# Patient Record
Sex: Male | Born: 1955 | Race: White | Hispanic: No | Marital: Married | Smoking: Never smoker
Health system: Southern US, Community
[De-identification: ages and names within clinical notes are randomized; demographics above are authoritative.]

## PROBLEM LIST (undated history)

## (undated) DIAGNOSIS — R7309 Other abnormal glucose: Secondary | ICD-10-CM

## (undated) DIAGNOSIS — M25569 Pain in unspecified knee: Secondary | ICD-10-CM

## (undated) DIAGNOSIS — I872 Venous insufficiency (chronic) (peripheral): Secondary | ICD-10-CM

## (undated) DIAGNOSIS — M1990 Unspecified osteoarthritis, unspecified site: Secondary | ICD-10-CM

## (undated) DIAGNOSIS — E559 Vitamin D deficiency, unspecified: Secondary | ICD-10-CM

## (undated) DIAGNOSIS — I219 Acute myocardial infarction, unspecified: Secondary | ICD-10-CM

## (undated) DIAGNOSIS — I4510 Unspecified right bundle-branch block: Secondary | ICD-10-CM

## (undated) DIAGNOSIS — K219 Gastro-esophageal reflux disease without esophagitis: Secondary | ICD-10-CM

## (undated) DIAGNOSIS — E669 Obesity, unspecified: Secondary | ICD-10-CM

## (undated) DIAGNOSIS — L509 Urticaria, unspecified: Secondary | ICD-10-CM

## (undated) DIAGNOSIS — E785 Hyperlipidemia, unspecified: Secondary | ICD-10-CM

## (undated) DIAGNOSIS — I2510 Atherosclerotic heart disease of native coronary artery without angina pectoris: Secondary | ICD-10-CM

## (undated) DIAGNOSIS — I10 Essential (primary) hypertension: Secondary | ICD-10-CM

## (undated) DIAGNOSIS — J069 Acute upper respiratory infection, unspecified: Secondary | ICD-10-CM

## (undated) DIAGNOSIS — E291 Testicular hypofunction: Secondary | ICD-10-CM

## (undated) DIAGNOSIS — N471 Phimosis: Secondary | ICD-10-CM

## (undated) DIAGNOSIS — D649 Anemia, unspecified: Secondary | ICD-10-CM

## (undated) DIAGNOSIS — N390 Urinary tract infection, site not specified: Secondary | ICD-10-CM

## (undated) DIAGNOSIS — R972 Elevated prostate specific antigen [PSA]: Secondary | ICD-10-CM

## (undated) DIAGNOSIS — M79644 Pain in right finger(s): Secondary | ICD-10-CM

## (undated) HISTORY — PX: SPINE SURGERY: SHX786

## (undated) HISTORY — DX: Testicular hypofunction: E29.1

## (undated) HISTORY — DX: Unspecified right bundle-branch block: I45.10

## (undated) HISTORY — DX: Essential (primary) hypertension: I10

## (undated) HISTORY — DX: Urinary tract infection, site not specified: N39.0

## (undated) HISTORY — DX: Anemia, unspecified: D64.9

## (undated) HISTORY — DX: Pain in unspecified knee: M25.569

## (undated) HISTORY — DX: Acute myocardial infarction, unspecified: I21.9

## (undated) HISTORY — DX: Unspecified osteoarthritis, unspecified site: M19.90

## (undated) HISTORY — DX: Obesity, unspecified: E66.9

## (undated) HISTORY — DX: Gastro-esophageal reflux disease without esophagitis: K21.9

## (undated) HISTORY — DX: Pain in right finger(s): M79.644

## (undated) HISTORY — DX: Atherosclerotic heart disease of native coronary artery without angina pectoris: I25.10

## (undated) HISTORY — DX: Vitamin D deficiency, unspecified: E55.9

## (undated) HISTORY — DX: Other abnormal glucose: R73.09

## (undated) HISTORY — DX: Phimosis: N47.1

## (undated) HISTORY — DX: Acute upper respiratory infection, unspecified: J06.9

## (undated) HISTORY — DX: Venous insufficiency (chronic) (peripheral): I87.2

## (undated) HISTORY — DX: Hyperlipidemia, unspecified: E78.5

## (undated) HISTORY — PX: WISDOM TOOTH EXTRACTION: SHX21

## (undated) HISTORY — DX: Elevated prostate specific antigen (PSA): R97.20

## (undated) HISTORY — DX: Urticaria, unspecified: L50.9

---

## 1995-07-11 HISTORY — PX: LUMBAR LAMINECTOMY: SHX95

## 2000-05-26 ENCOUNTER — Encounter: Payer: Self-pay | Admitting: Emergency Medicine

## 2000-05-26 ENCOUNTER — Emergency Department (HOSPITAL_COMMUNITY): Admission: EM | Admit: 2000-05-26 | Discharge: 2000-05-26 | Payer: Self-pay | Admitting: Emergency Medicine

## 2000-05-28 ENCOUNTER — Emergency Department (HOSPITAL_COMMUNITY): Admission: EM | Admit: 2000-05-28 | Discharge: 2000-05-28 | Payer: Self-pay | Admitting: Emergency Medicine

## 2004-06-07 ENCOUNTER — Ambulatory Visit: Payer: Self-pay | Admitting: Internal Medicine

## 2004-07-26 ENCOUNTER — Ambulatory Visit: Payer: Self-pay | Admitting: Internal Medicine

## 2004-08-23 ENCOUNTER — Ambulatory Visit: Payer: Self-pay | Admitting: Internal Medicine

## 2005-02-21 ENCOUNTER — Ambulatory Visit: Payer: Self-pay | Admitting: Internal Medicine

## 2005-05-13 ENCOUNTER — Ambulatory Visit: Payer: Self-pay | Admitting: Internal Medicine

## 2005-05-16 ENCOUNTER — Ambulatory Visit: Payer: Self-pay | Admitting: Internal Medicine

## 2005-09-12 ENCOUNTER — Ambulatory Visit: Payer: Self-pay | Admitting: Internal Medicine

## 2007-02-23 ENCOUNTER — Encounter: Payer: Self-pay | Admitting: Internal Medicine

## 2007-02-23 DIAGNOSIS — I10 Essential (primary) hypertension: Secondary | ICD-10-CM | POA: Insufficient documentation

## 2007-02-23 HISTORY — DX: Essential (primary) hypertension: I10

## 2007-03-12 ENCOUNTER — Ambulatory Visit: Payer: Self-pay | Admitting: Internal Medicine

## 2007-03-12 DIAGNOSIS — K219 Gastro-esophageal reflux disease without esophagitis: Secondary | ICD-10-CM

## 2007-03-12 DIAGNOSIS — M25569 Pain in unspecified knee: Secondary | ICD-10-CM

## 2007-03-12 DIAGNOSIS — M1990 Unspecified osteoarthritis, unspecified site: Secondary | ICD-10-CM

## 2007-03-12 HISTORY — DX: Unspecified osteoarthritis, unspecified site: M19.90

## 2007-03-12 HISTORY — DX: Gastro-esophageal reflux disease without esophagitis: K21.9

## 2007-03-12 HISTORY — DX: Pain in unspecified knee: M25.569

## 2007-03-12 LAB — CONVERTED CEMR LAB
ALT: 35 units/L (ref 0–53)
AST: 19 units/L (ref 0–37)
Albumin: 3.7 g/dL (ref 3.5–5.2)
Alkaline Phosphatase: 45 units/L (ref 39–117)
BUN: 14 mg/dL (ref 6–23)
Basophils Absolute: 0.1 10*3/uL (ref 0.0–0.1)
Basophils Relative: 0.9 % (ref 0.0–1.0)
Bilirubin, Direct: 0.1 mg/dL (ref 0.0–0.3)
CO2: 31 meq/L (ref 19–32)
Calcium: 9.5 mg/dL (ref 8.4–10.5)
Chloride: 101 meq/L (ref 96–112)
Cholesterol: 205 mg/dL (ref 0–200)
Creatinine, Ser: 1 mg/dL (ref 0.4–1.5)
Direct LDL: 149.9 mg/dL
Eosinophils Absolute: 0.1 10*3/uL (ref 0.0–0.6)
Eosinophils Relative: 1.7 % (ref 0.0–5.0)
GFR calc Af Amer: 102 mL/min
GFR calc non Af Amer: 84 mL/min
Glucose, Bld: 166 mg/dL — ABNORMAL HIGH (ref 70–99)
HCT: 42.5 % (ref 39.0–52.0)
HDL: 31.5 mg/dL — ABNORMAL LOW (ref >39.0)
Hemoglobin: 15 g/dL (ref 13.0–17.0)
Lymphocytes Relative: 22.6 % (ref 12.0–46.0)
MCHC: 35.2 g/dL (ref 30.0–36.0)
MCV: 83.1 fL (ref 78.0–100.0)
Monocytes Absolute: 0.8 10*3/uL — ABNORMAL HIGH (ref 0.2–0.7)
Monocytes Relative: 10 % (ref 3.0–11.0)
Neutro Abs: 5.1 10*3/uL (ref 1.4–7.7)
Neutrophils Relative %: 64.8 % (ref 43.0–77.0)
Platelets: 281 10*3/uL (ref 150–400)
Potassium: 4.4 meq/L (ref 3.5–5.1)
RBC: 5.11 M/uL (ref 4.22–5.81)
RDW: 12.6 % (ref 11.5–14.6)
Sodium: 140 meq/L (ref 135–145)
TSH: 2.64 microintl units/mL (ref 0.35–5.50)
Total Bilirubin: 0.8 mg/dL (ref 0.3–1.2)
Total CHOL/HDL Ratio: 6.5
Total Protein: 6.8 g/dL (ref 6.0–8.3)
Triglycerides: 154 mg/dL — ABNORMAL HIGH (ref 0–149)
VLDL: 31 mg/dL (ref 0–40)
WBC: 7.9 10*3/uL (ref 4.5–10.5)

## 2007-03-14 ENCOUNTER — Encounter: Payer: Self-pay | Admitting: Internal Medicine

## 2007-03-14 DIAGNOSIS — E559 Vitamin D deficiency, unspecified: Secondary | ICD-10-CM | POA: Insufficient documentation

## 2007-03-14 HISTORY — DX: Vitamin D deficiency, unspecified: E55.9

## 2007-03-14 LAB — CONVERTED CEMR LAB: Vit D, 1,25-Dihydroxy: 16 — ABNORMAL LOW (ref 30–89)

## 2007-08-14 ENCOUNTER — Telehealth (INDEPENDENT_AMBULATORY_CARE_PROVIDER_SITE_OTHER): Payer: Self-pay | Admitting: *Deleted

## 2007-08-20 ENCOUNTER — Ambulatory Visit: Payer: Self-pay | Admitting: Internal Medicine

## 2007-08-21 LAB — CONVERTED CEMR LAB
BUN: 14 mg/dL (ref 6–23)
CO2: 32 meq/L (ref 19–32)
Calcium: 9.7 mg/dL (ref 8.4–10.5)
Chloride: 106 meq/L (ref 96–112)
Creatinine, Ser: 1.1 mg/dL (ref 0.4–1.5)
GFR calc Af Amer: 91 mL/min
GFR calc non Af Amer: 75 mL/min
Glucose, Bld: 102 mg/dL — ABNORMAL HIGH (ref 70–99)
Hgb A1c MFr Bld: 6.5 % — ABNORMAL HIGH (ref 4.6–6.0)
Potassium: 4 meq/L (ref 3.5–5.1)
Sodium: 141 meq/L (ref 135–145)

## 2007-08-24 ENCOUNTER — Ambulatory Visit: Payer: Self-pay | Admitting: Internal Medicine

## 2007-08-24 DIAGNOSIS — E669 Obesity, unspecified: Secondary | ICD-10-CM | POA: Insufficient documentation

## 2007-08-24 DIAGNOSIS — R7309 Other abnormal glucose: Secondary | ICD-10-CM | POA: Insufficient documentation

## 2007-09-15 ENCOUNTER — Emergency Department (HOSPITAL_BASED_OUTPATIENT_CLINIC_OR_DEPARTMENT_OTHER): Admission: EM | Admit: 2007-09-15 | Discharge: 2007-09-15 | Payer: Self-pay | Admitting: Emergency Medicine

## 2007-09-25 ENCOUNTER — Emergency Department (HOSPITAL_BASED_OUTPATIENT_CLINIC_OR_DEPARTMENT_OTHER): Admission: EM | Admit: 2007-09-25 | Discharge: 2007-09-25 | Payer: Self-pay | Admitting: Emergency Medicine

## 2007-12-26 ENCOUNTER — Telehealth: Payer: Self-pay | Admitting: Internal Medicine

## 2008-02-25 ENCOUNTER — Ambulatory Visit: Payer: Self-pay | Admitting: Internal Medicine

## 2008-02-26 LAB — CONVERTED CEMR LAB
BUN: 16 mg/dL (ref 6–23)
CO2: 30 meq/L (ref 19–32)
Calcium: 9.2 mg/dL (ref 8.4–10.5)
Chloride: 104 meq/L (ref 96–112)
Creatinine, Ser: 1 mg/dL (ref 0.4–1.5)
GFR calc Af Amer: 101 mL/min
GFR calc non Af Amer: 84 mL/min
Glucose, Bld: 125 mg/dL — ABNORMAL HIGH (ref 70–99)
Hgb A1c MFr Bld: 6.5 % — ABNORMAL HIGH (ref 4.6–6.0)
Potassium: 4.4 meq/L (ref 3.5–5.1)
Sodium: 139 meq/L (ref 135–145)

## 2008-02-29 ENCOUNTER — Ambulatory Visit: Payer: Self-pay | Admitting: Internal Medicine

## 2008-08-22 ENCOUNTER — Ambulatory Visit: Payer: Self-pay | Admitting: Internal Medicine

## 2008-08-24 LAB — CONVERTED CEMR LAB
ALT: 28 units/L (ref 0–53)
AST: 16 units/L (ref 0–37)
Albumin: 3.8 g/dL (ref 3.5–5.2)
Alkaline Phosphatase: 54 units/L (ref 39–117)
BUN: 20 mg/dL (ref 6–23)
Basophils Absolute: 0.1 10*3/uL (ref 0.0–0.1)
Basophils Relative: 1.3 % (ref 0.0–3.0)
Bilirubin Urine: NEGATIVE
Bilirubin, Direct: 0.1 mg/dL (ref 0.0–0.3)
CO2: 32 meq/L (ref 19–32)
Calcium: 9.2 mg/dL (ref 8.4–10.5)
Chloride: 106 meq/L (ref 96–112)
Cholesterol: 184 mg/dL (ref 0–200)
Creatinine, Ser: 1.1 mg/dL (ref 0.4–1.5)
Eosinophils Absolute: 0.2 10*3/uL (ref 0.0–0.7)
Eosinophils Relative: 2 % (ref 0.0–5.0)
GFR calc non Af Amer: 74.63 mL/min (ref >60)
Glucose, Bld: 123 mg/dL — ABNORMAL HIGH (ref 70–99)
HCT: 41.8 % (ref 39.0–52.0)
HDL: 28.1 mg/dL — ABNORMAL LOW (ref >39.00)
Hemoglobin, Urine: NEGATIVE
Hemoglobin: 14.5 g/dL (ref 13.0–17.0)
Hgb A1c MFr Bld: 7.3 % — ABNORMAL HIGH (ref 4.6–6.5)
Ketones, ur: NEGATIVE mg/dL
LDL Cholesterol: 138 mg/dL — ABNORMAL HIGH (ref 0–99)
Leukocytes, UA: NEGATIVE
Lymphocytes Relative: 26.1 % (ref 12.0–46.0)
Lymphs Abs: 2.1 10*3/uL (ref 0.7–4.0)
MCHC: 34.6 g/dL (ref 30.0–36.0)
MCV: 83.6 fL (ref 78.0–100.0)
Monocytes Absolute: 0.8 10*3/uL (ref 0.1–1.0)
Monocytes Relative: 10.3 % (ref 3.0–12.0)
Neutro Abs: 4.9 10*3/uL (ref 1.4–7.7)
Neutrophils Relative %: 60.3 % (ref 43.0–77.0)
Nitrite: NEGATIVE
PSA: 1.01 ng/mL (ref 0.10–4.00)
Platelets: 257 10*3/uL (ref 150.0–400.0)
Potassium: 4.4 meq/L (ref 3.5–5.1)
RBC: 5 M/uL (ref 4.22–5.81)
RDW: 12.5 % (ref 11.5–14.6)
Sodium: 143 meq/L (ref 135–145)
Specific Gravity, Urine: 1.025 (ref 1.000–1.030)
TSH: 1.88 microintl units/mL (ref 0.35–5.50)
Total Bilirubin: 0.8 mg/dL (ref 0.3–1.2)
Total CHOL/HDL Ratio: 7
Total Protein, Urine: 100 mg/dL
Total Protein: 6.7 g/dL (ref 6.0–8.3)
Triglycerides: 90 mg/dL (ref 0.0–149.0)
Urine Glucose: NEGATIVE mg/dL
Urobilinogen, UA: 1 (ref 0.0–1.0)
VLDL: 18 mg/dL (ref 0.0–40.0)
WBC: 8.1 10*3/uL (ref 4.5–10.5)
pH: 6.5 (ref 5.0–8.0)

## 2008-08-29 ENCOUNTER — Ambulatory Visit: Payer: Self-pay | Admitting: Internal Medicine

## 2008-11-11 ENCOUNTER — Encounter: Payer: Self-pay | Admitting: Internal Medicine

## 2008-11-11 ENCOUNTER — Telehealth: Payer: Self-pay | Admitting: Internal Medicine

## 2009-01-09 ENCOUNTER — Ambulatory Visit: Payer: Self-pay | Admitting: Internal Medicine

## 2009-01-09 LAB — CONVERTED CEMR LAB
BUN: 19 mg/dL (ref 6–23)
CO2: 31 meq/L (ref 19–32)
Calcium: 9.2 mg/dL (ref 8.4–10.5)
Chloride: 107 meq/L (ref 96–112)
Creatinine, Ser: 1.1 mg/dL (ref 0.4–1.5)
GFR calc non Af Amer: 74.52 mL/min (ref >60)
Glucose, Bld: 146 mg/dL — ABNORMAL HIGH (ref 70–99)
Hgb A1c MFr Bld: 6.9 % — ABNORMAL HIGH (ref 4.6–6.5)
Potassium: 4.6 meq/L (ref 3.5–5.1)
Sodium: 141 meq/L (ref 135–145)

## 2009-01-30 ENCOUNTER — Ambulatory Visit: Payer: Self-pay | Admitting: Internal Medicine

## 2009-02-27 ENCOUNTER — Telehealth: Payer: Self-pay | Admitting: Internal Medicine

## 2009-06-01 ENCOUNTER — Ambulatory Visit: Payer: Self-pay | Admitting: Internal Medicine

## 2009-06-01 LAB — CONVERTED CEMR LAB
BUN: 15 mg/dL (ref 6–23)
CO2: 31 meq/L (ref 19–32)
Calcium: 9.5 mg/dL (ref 8.4–10.5)
Chloride: 107 meq/L (ref 96–112)
Creatinine, Ser: 1.2 mg/dL (ref 0.4–1.5)
GFR calc non Af Amer: 67.3 mL/min (ref >60)
Glucose, Bld: 140 mg/dL — ABNORMAL HIGH (ref 70–99)
Hgb A1c MFr Bld: 6.7 % — ABNORMAL HIGH (ref 4.6–6.5)
Potassium: 4.3 meq/L (ref 3.5–5.1)
Sodium: 142 meq/L (ref 135–145)

## 2009-06-05 ENCOUNTER — Ambulatory Visit: Payer: Self-pay | Admitting: Internal Medicine

## 2009-06-05 DIAGNOSIS — L509 Urticaria, unspecified: Secondary | ICD-10-CM

## 2009-06-05 HISTORY — DX: Urticaria, unspecified: L50.9

## 2009-09-25 ENCOUNTER — Emergency Department (HOSPITAL_COMMUNITY): Admission: EM | Admit: 2009-09-25 | Discharge: 2009-09-26 | Payer: Self-pay | Admitting: Emergency Medicine

## 2009-09-28 ENCOUNTER — Ambulatory Visit: Payer: Self-pay | Admitting: Internal Medicine

## 2009-09-28 LAB — CONVERTED CEMR LAB
BUN: 17 mg/dL (ref 6–23)
CO2: 30 meq/L (ref 19–32)
Calcium: 9.1 mg/dL (ref 8.4–10.5)
Chloride: 107 meq/L (ref 96–112)
Creatinine, Ser: 1.1 mg/dL (ref 0.4–1.5)
GFR calc non Af Amer: 77.56 mL/min (ref >60)
Glucose, Bld: 180 mg/dL — ABNORMAL HIGH (ref 70–99)
Hgb A1c MFr Bld: 6.8 % — ABNORMAL HIGH (ref 4.6–6.5)
Potassium: 4.5 meq/L (ref 3.5–5.1)
Sodium: 142 meq/L (ref 135–145)

## 2009-10-02 ENCOUNTER — Ambulatory Visit: Payer: Self-pay | Admitting: Internal Medicine

## 2010-02-01 ENCOUNTER — Ambulatory Visit: Payer: Self-pay | Admitting: Internal Medicine

## 2010-02-01 LAB — CONVERTED CEMR LAB
BUN: 14 mg/dL (ref 6–23)
CO2: 29 meq/L (ref 19–32)
Calcium: 9.3 mg/dL (ref 8.4–10.5)
Chloride: 102 meq/L (ref 96–112)
Creatinine, Ser: 1.1 mg/dL (ref 0.4–1.5)
GFR calc non Af Amer: 74.22 mL/min (ref >60)
Glucose, Bld: 148 mg/dL — ABNORMAL HIGH (ref 70–99)
Hgb A1c MFr Bld: 7 % — ABNORMAL HIGH (ref 4.6–6.5)
Potassium: 4.7 meq/L (ref 3.5–5.1)
Sodium: 138 meq/L (ref 135–145)

## 2010-02-05 ENCOUNTER — Ambulatory Visit: Payer: Self-pay | Admitting: Internal Medicine

## 2010-02-05 DIAGNOSIS — E119 Type 2 diabetes mellitus without complications: Secondary | ICD-10-CM | POA: Insufficient documentation

## 2010-05-13 NOTE — Assessment & Plan Note (Signed)
Summary: 4 MTH FU  STC   Vital Signs:  Patient profile:   55 year old male Weight:      312 pounds BMI:     43.67 Temp:     97.5 degrees F oral Pulse rate:   81 / minute BP sitting:   100 / 70  (left arm)  Vitals Entered By: Tora Perches (June 05, 2009 7:57 AM) CC: f/u   CC:  f/u.  History of Present Illness: C/o rash on  arms once; however now c/o hives after scratching The patient presents for a follow up of hypertension, diabetes   Preventive Screening-Counseling & Management  Alcohol-Tobacco     Smoking Status: never  Current Medications (verified): 1)  Omeprazole 40 Mg  Cpdr (Omeprazole) .... Once Daily in Am 2)  Lotensin Hct 20-12.5 Mg  Tabs (Benazepril-Hydrochlorothiazide) .... Take 2 By Mouth Qd 3)  Naprosyn 500 Mg Tabs (Naproxen) .Marland Kitchen.. 1 Two Times A Day Pc As Needed Pain 4)  Vitamin D3 1000 Unit  Tabs (Cholecalciferol) .Marland Kitchen.. 1 Qd 5)  Metformin Hcl 500 Mg Tabs (Metformin Hcl) .Marland Kitchen.. 1 By Mouth Bid 6)  Fish Oil 1000 Mg Caps (Omega-3 Fatty Acids) .... Two Times A Day 7)  Freestyle Test  Strp (Glucose Blood) .... 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    Physical Exam  General:  overweight-appearing.   Nose:  External nasal examination shows no deformity or inflammation. Nasal mucosa are pink and moist without lesions or exudates. Mouth:  Oral mucosa and oropharynx without lesions or exudates.  Teeth in good repair. Lungs:  Normal respiratory effort, chest expands symmetrically. Lungs are clear to auscultation, no crackles or wheezes. Heart:  Normal rate and regular rhythm. S1 and S2 normal without gallop, murmur, click, rub or other  extra sounds. Abdomen:  Bowel sounds positive,abdomen soft and non-tender without masses, organomegaly or hernias noted. Msk:  No deformity or scoliosis noted of thoracic or lumbar spine.   Neurologic:  No cranial nerve deficits noted. Station and gait are normal. Plantar reflexes are down-going bilaterally. DTRs are symmetrical throughout. Sensory, motor and coordinative functions appear intact. Skin:  no rash   Impression & Recommendations:  Problem # 1:  URTICARIA (ICD-708.9) Assessment New Hold Benazepr/HCT ( likely HCTZ is causing the rash)  Problem # 2:  OBESITY (ICD-278.00) Assessment: Improved  Problem # 3:  GERD (ICD-530.81) Assessment: Unchanged  His updated medication list for this problem includes:    Omeprazole 40 Mg Cpdr (Omeprazole) ..... Once daily in am  Complete Medication List: 1)  Omeprazole 40 Mg Cpdr (Omeprazole) .... Once daily in am 2)  Lotensin Hct 20-12.5 Mg Tabs (Benazepril-hydrochlorothiazide) .... Take 2 by mouth qd 3)  Naprosyn 500 Mg Tabs (Naproxen) .Marland Kitchen.. 1 two times a day pc as needed pain 4)  Vitamin D3 1000 Unit Tabs (Cholecalciferol) .Marland Kitchen.. 1 qd 5)  Metformin Hcl 500 Mg Tabs (Metformin hcl) .Marland Kitchen.. 1 by mouth bid 6)  Fish Oil 1000 Mg Caps (Omega-3 fatty acids) .... Two times a day 7)  Freestyle Test Strp (Glucose blood) .... Test two times a day  Contraindications/Deferment of Procedures/Staging:    Test/Procedure: FLU VAX    Reason for deferment: patient declined  Patient Instructions: 1)  Hold Benazepr/HCT 2)  Restart Benicar 1 a day 3)  Please schedule a follow-up appointment in 3-4 months. 4)  BMP prior to visit, ICD-9: 5)  HbgA1C prior to visit, ICD-9:250.00

## 2010-05-13 NOTE — Assessment & Plan Note (Signed)
Summary: 4 mo rov /nws   Vital Signs:  Patient profile:   55 year old male Height:      71 inches Weight:      313 pounds BMI:     43.81 Temp:     98.3 degrees F oral Pulse rate:   76 / minute Pulse rhythm:   regular Resp:     16 per minute BP sitting:   138 / 80  (left arm) Cuff size:   large  Vitals Entered By: Lanier Prude, CMA(AAMA) (February 05, 2010 7:51 AM) CC: 4 mo f/u Is Patient Diabetic? No   CC:  4 mo f/u.  History of Present Illness: The patient presents for a follow up of hypertension, diabetes, rash   Current Medications (verified): 1)  Omeprazole 40 Mg  Cpdr (Omeprazole) .... Once Daily in Am 2)  Naprosyn 500 Mg Tabs (Naproxen) .Marland Kitchen.. 1 Two Times A Day Pc As Needed Pain 3)  Vitamin D3 1000 Unit  Tabs (Cholecalciferol) .Marland Kitchen.. 1 Qd 4)  Fish Oil 1000 Mg Caps (Omega-3 Fatty Acids) .... Two Times A Day 5)  Freestyle Test  Strp (Glucose Blood) .... Test Two Times A Day 6)  Hydrocodone-Acetaminophen 5-325 Mg Tabs (Hydrocodone-Acetaminophen) .... As Directed 7)  Benazepril Hcl 40 Mg Tabs (Benazepril Hcl) .Marland Kitchen.. 1 By Mouth Qd 8)  Metformin Hcl 500 Mg Tabs (Metformin Hcl) .Marland Kitchen.. 1 By Mouth Two Times A Day For Diabetes  Allergies (verified): No Known Drug Allergies  Past History:  Past Medical History: Hypertension GERD Osteoarthritis Obesity Elev glu 2009 Diabetes mellitus, type II  2011  Review of Systems  The patient denies weight loss, weight gain, chest pain, dyspnea on exertion, and abdominal pain.    Contraindications/Deferment of Procedures/Staging:    Test/Procedure: FLU VAX    Reason for deferment: patient declined   Physical Exam  General:  overweight-appearing.   Nose:  External nasal examination shows no deformity or inflammation. Nasal mucosa are pink and moist without lesions or exudates. Mouth:  Oral mucosa and oropharynx without lesions or exudates.  Teeth in good repair. Lungs:  Normal respiratory effort, chest expands symmetrically.  Lungs are clear to auscultation, no crackles or wheezes. Heart:  Normal rate and regular rhythm. S1 and S2 normal without gallop, murmur, click, rub or other extra sounds. Abdomen:  Bowel sounds positive,abdomen soft and non-tender without masses, organomegaly or hernias noted. Msk:  No deformity or scoliosis noted of thoracic or lumbar spine.   Neurologic:  No cranial nerve deficits noted. Station and gait are normal. Plantar reflexes are down-going bilaterally. DTRs are symmetrical throughout. Sensory, motor and coordinative functions appear intact. Skin:  no rash hyperpigm R anter shin patch Psych:  Cognition and judgment appear intact. Alert and cooperative with normal attention span and concentration. No apparent delusions, illusions, hallucinations   Impression & Recommendations:  Problem # 1:  URTICARIA (ICD-708.9) resolved Assessment Improved  Problem # 2:  HYPERTENSION (ICD-401.9) Assessment: Unchanged  His updated medication list for this problem includes:    Benazepril Hcl 40 Mg Tabs (Benazepril hcl) .Marland Kitchen... 1 by mouth qd  Complete Medication List: 1)  Omeprazole 40 Mg Cpdr (Omeprazole) .... Once daily in am 2)  Naprosyn 500 Mg Tabs (Naproxen) .Marland Kitchen.. 1 two times a day pc as needed pain 3)  Vitamin D3 1000 Unit Tabs (Cholecalciferol) .Marland Kitchen.. 1 qd 4)  Fish Oil 1000 Mg Caps (Omega-3 fatty acids) .... Two times a day 5)  Freestyle Test Strp (Glucose blood) .... Test two times  a day 6)  Hydrocodone-acetaminophen 5-325 Mg Tabs (Hydrocodone-acetaminophen) .... As directed 7)  Benazepril Hcl 40 Mg Tabs (Benazepril hcl) .Marland Kitchen.. 1 by mouth qd 8)  Metformin Hcl 500 Mg Tabs (Metformin hcl) .Marland Kitchen.. 1 by mouth three times a day for diabetes  Patient Instructions: 1)  Please schedule a follow-up appointment in 4 months. 2)  BMP prior to visit, ICD-9: 3)  HbgA1C prior to visit, ICD-9: 250.00 Prescriptions: METFORMIN HCL 500 MG TABS (METFORMIN HCL) 1 by mouth three times a day for diabetes  #270 x  3   Entered and Authorized by:   Tresa Garter MD   Signed by:   Tresa Garter MD on 02/05/2010   Method used:   Print then Give to Patient   RxID:   [id number] BENAZEPRIL HCL 40 MG TABS (BENAZEPRIL HCL) 1 by mouth qd  #90 x 3   Entered and Authorized by:   Tresa Garter MD   Signed by:   Tresa Garter MD on 02/05/2010   Method used:   Print then Give to Patient   RxID:   [id number] NAPROSYN 500 MG TABS (NAPROXEN) 1 two times a day pc as needed pain  #180 x 1   Entered and Authorized by:   Tresa Garter MD   Signed by:   Tresa Garter MD on 02/05/2010   Method used:   Print then Give to Patient   RxID:   [id number] OMEPRAZOLE 40 MG  CPDR (OMEPRAZOLE) once daily in am  #90 x 3   Entered and Authorized by:   Tresa Garter MD   Signed by:   Tresa Garter MD on 02/05/2010   Method used:   Print then Give to Patient   RxID:   [id number] METFORMIN HCL 500 MG TABS (METFORMIN HCL) 1 by mouth three times a day for diabetes  #90 x 11   Entered and Authorized by:   Tresa Garter MD   Signed by:   Tresa Garter MD on 02/05/2010   Method used:   Print then Give to Patient   RxID:   [id number]    Orders Added: 1)  Est. Patient Level IV [85277]   Not Administered:    Influenza Vaccine not given due to: declined

## 2010-05-13 NOTE — Assessment & Plan Note (Signed)
Summary: 3-4 MO ROV /NWS  #   Vital Signs:  Patient profile:   55 year old male Height:      71 inches (180.34 cm) Weight:      313.75 pounds (142.61 kg) BMI:     43.92 O2 Sat:      96 % on Room air Temp:     97.7 degrees F (36.50 degrees C) oral Pulse rate:   80 / minute BP sitting:   122 / 78  (left arm) Cuff size:   large  Vitals Entered By: Lucious Groves (October 02, 2009 7:52 AM)  O2 Flow:  Room air CC: 3-4 mo rtn ov./kb Comments Patient is taking eye drops due to recent eye injury./kb   CC:  3-4 mo rtn ov./kb.  History of Present Illness: The patient presents for a follow up of GERD, elev. glu, hyperlipidemia   Current Medications (verified): 1)  Omeprazole 40 Mg  Cpdr (Omeprazole) .... Once Daily in Am 2)  Lotensin Hct 20-12.5 Mg  Tabs (Benazepril-Hydrochlorothiazide) .... Take 2 By Mouth Qd 3)  Naprosyn 500 Mg Tabs (Naproxen) .Marland Kitchen.. 1 Two Times A Day Pc As Needed Pain 4)  Vitamin D3 1000 Unit  Tabs (Cholecalciferol) .Marland Kitchen.. 1 Qd 5)  Metformin Hcl 500 Mg Tabs (Metformin Hcl) .Marland Kitchen.. 1 By Mouth Bid 6)  Fish Oil 1000 Mg Caps (Omega-3 Fatty Acids) .... Two Times A Day 7)  Freestyle Test  Strp (Glucose Blood) .... Test Two Times A Day 8)  Hydrocodone-Acetaminophen 5-325 Mg Tabs (Hydrocodone-Acetaminophen) .... 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 Family History Hypertension S DM  Social History: Reviewed history from 01/30/2009 and no changes required. 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    Physical Exam  General:   overweight-appearing.   Nose:  External nasal examination shows no deformity or inflammation. Nasal mucosa are pink and moist without lesions or exudates. Mouth:  Oral mucosa and oropharynx without lesions or exudates.  Teeth in good repair. Neck:  No deformities, masses, or tenderness noted. Lungs:  Normal respiratory effort, chest expands symmetrically. Lungs are clear to auscultation, no crackles or wheezes. Heart:  Normal rate and regular rhythm. S1 and S2 normal without gallop, murmur, click, rub or other extra sounds. Abdomen:  Bowel sounds positive,abdomen soft and non-tender without masses, organomegaly or hernias noted. Msk:  No deformity or scoliosis noted of thoracic or lumbar spine.   Neurologic:  No cranial nerve deficits noted. Station and gait are normal. Plantar reflexes are down-going bilaterally. DTRs are symmetrical throughout. Sensory, motor and coordinative functions appear intact. Skin:  no rash Psych:  Cognition and judgment appear intact. Alert and cooperative with normal attention span and concentration. No apparent delusions, illusions, hallucinations   Impression & Recommendations:  Problem # 1:  ABNORMAL GLUCOSE NEC (ICD-790.29) Assessment Deteriorated  The following medications were removed from the medication list:    Metformin Hcl 500 Mg Tabs (Metformin hcl) .Marland Kitchen... 1 by mouth bid    Metformin Hcl 1000 Mg Tabs (Metformin hcl) .Marland Kitchen... 1 by mouth two times a day for diabetes His updated medication list for this problem includes:    Metformin  Hcl 500 Mg Tabs (Metformin hcl) .Marland Kitchen... 1 by mouth two times a day for diabetes  Problem # 2:  HYPERTENSION (ICD-401.9) Assessment: Unchanged  The following medications were removed from the medication list:    Lotensin Hct 20-12.5 Mg Tabs (Benazepril-hydrochlorothiazide) .Marland Kitchen... Take 2 by mouth qd His updated medication list for this problem includes:    Benazepril Hcl 40 Mg Tabs (Benazepril hcl) .Marland Kitchen... 1 by mouth qd  BP  today: 122/78 Prior BP: 100/70 (06/05/2009)  Labs Reviewed: K+: 4.5 (09/28/2009) Creat: : 1.1 (09/28/2009)   Chol: 184 (08/22/2008)   HDL: 28.10 (08/22/2008)   LDL: 138 (08/22/2008)   TG: 90.0 (08/22/2008)  Problem # 3:  OBESITY (ICD-278.00) Assessment: Unchanged  Complete Medication List: 1)  Omeprazole 40 Mg Cpdr (Omeprazole) .... Once daily in am 2)  Naprosyn 500 Mg Tabs (Naproxen) .Marland Kitchen.. 1 two times a day pc as needed pain 3)  Vitamin D3 1000 Unit Tabs (Cholecalciferol) .Marland Kitchen.. 1 qd 4)  Fish Oil 1000 Mg Caps (Omega-3 fatty acids) .... Two times a day 5)  Freestyle Test Strp (Glucose blood) .... Test two times a day 6)  Hydrocodone-acetaminophen 5-325 Mg Tabs (Hydrocodone-acetaminophen) .... As directed 7)  Benazepril Hcl 40 Mg Tabs (Benazepril hcl) .Marland Kitchen.. 1 by mouth qd 8)  Metformin Hcl 500 Mg Tabs (Metformin hcl) .Marland Kitchen.. 1 by mouth two times a day for diabetes  Patient Instructions: 1)  Please schedule a follow-up appointment in 4 months. 2)  BMP prior to visit, ICD-9: 3)  HbgA1C prior to visit, ICD-9: 995.20 Prescriptions: METFORMIN HCL 500 MG TABS (METFORMIN HCL) 1 by mouth two times a day for diabetes  #180 x 3   Entered and Authorized by:   Tresa Garter MD   Signed by:   Tresa Garter MD on 10/02/2009   Method used:   Print then Give to Patient   RxID:   [id number] NAPROSYN 500 MG TABS (NAPROXEN) 1 two times a day pc as needed pain  #180 x 1   Entered and Authorized by:   Tresa Garter MD   Signed by:   Tresa Garter MD on 10/02/2009   Method used:   Print then Give to Patient   RxID:   [id number] OMEPRAZOLE 40 MG  CPDR (OMEPRAZOLE) once daily in am  #90 x 3   Entered and Authorized by:   Tresa Garter MD   Signed by:   Tresa Garter MD on 10/02/2009   Method used:   Print then Give to Patient   RxID:   [id number] METFORMIN HCL 1000 MG TABS (METFORMIN HCL) 1 by mouth two times a day for diabetes  #180 x 3   Entered and  Authorized by:   Tresa Garter MD   Signed by:   Tresa Garter MD on 10/02/2009   Method used:   Print then Give to Patient   RxID:   [id number] BENAZEPRIL HCL 40 MG TABS (BENAZEPRIL HCL) 1 by mouth qd  #90 x 3   Entered and Authorized by:   Tresa Garter MD   Signed by:   Tresa Garter MD on 10/02/2009   Method used:   Print then Give to Patient   RxID:   [id number]

## 2010-06-07 ENCOUNTER — Other Ambulatory Visit: Payer: Self-pay

## 2010-06-07 ENCOUNTER — Encounter (INDEPENDENT_AMBULATORY_CARE_PROVIDER_SITE_OTHER): Payer: Self-pay | Admitting: *Deleted

## 2010-06-07 ENCOUNTER — Other Ambulatory Visit: Payer: Self-pay | Admitting: Internal Medicine

## 2010-06-07 DIAGNOSIS — E119 Type 2 diabetes mellitus without complications: Secondary | ICD-10-CM

## 2010-06-07 LAB — BASIC METABOLIC PANEL
Chloride: 105 mEq/L (ref 96–112)
Creatinine, Ser: 1 mg/dL (ref 0.4–1.5)
GFR: 85.7 mL/min (ref >60.00)
Potassium: 4.7 mEq/L (ref 3.5–5.1)

## 2010-06-07 LAB — HEMOGLOBIN A1C: Hgb A1c MFr Bld: 7.6 % — ABNORMAL HIGH (ref 4.6–6.5)

## 2010-06-11 ENCOUNTER — Ambulatory Visit (INDEPENDENT_AMBULATORY_CARE_PROVIDER_SITE_OTHER): Payer: BC Managed Care – PPO | Admitting: Internal Medicine

## 2010-06-11 ENCOUNTER — Encounter: Payer: Self-pay | Admitting: Internal Medicine

## 2010-06-11 DIAGNOSIS — J069 Acute upper respiratory infection, unspecified: Secondary | ICD-10-CM

## 2010-06-11 DIAGNOSIS — E119 Type 2 diabetes mellitus without complications: Secondary | ICD-10-CM

## 2010-06-11 DIAGNOSIS — I10 Essential (primary) hypertension: Secondary | ICD-10-CM

## 2010-06-11 DIAGNOSIS — E669 Obesity, unspecified: Secondary | ICD-10-CM

## 2010-06-11 HISTORY — DX: Acute upper respiratory infection, unspecified: J06.9

## 2010-06-17 NOTE — Assessment & Plan Note (Signed)
Summary: 4 MO FU/NWS#   Vital Signs:  Patient profile:   55 year old male Height:      71 inches Weight:      320 pounds BMI:     44.79 Temp:     98.8 degrees F oral Pulse rate:   80 / minute Pulse rhythm:   regular Resp:     16 per minute BP sitting:   138 / 80  (left arm) Cuff size:   large  Vitals Entered By: Lanier Prude, CMA(AAMA) (June 11, 2010 8:06 AM) CC: 4 mo f/u  Is Patient Diabetic? Yes Comments pt is not taking Hydroco/APAP   CC:  4 mo f/u .  History of Present Illness: The patient presents for a follow up of hypertension, diabetes, hyperlipidemia  The patient presents with complaints of sore throat, fever, cough, sinus congestion and drainge of 10-14 days duration. Not better with OTC meds. Chest hurts with coughing. Can't sleep due to cough. Muscle aches are present.  The mucus is colored green.   Current Medications (verified): 1)  Omeprazole 40 Mg  Cpdr (Omeprazole) .... Once Daily in Am 2)  Naprosyn 500 Mg Tabs (Naproxen) .Marland Kitchen.. 1 Two Times A Day Pc As Needed Pain 3)  Vitamin D3 1000 Unit  Tabs (Cholecalciferol) .Marland Kitchen.. 1 Qd 4)  Fish Oil 1000 Mg Caps (Omega-3 Fatty Acids) .... Two Times A Day 5)  Freestyle Test  Strp (Glucose Blood) .... Test Two Times A Day 6)  Hydrocodone-Acetaminophen 5-325 Mg Tabs (Hydrocodone-Acetaminophen) .... As Directed 7)  Benazepril Hcl 40 Mg Tabs (Benazepril Hcl) .Marland Kitchen.. 1 By Mouth Qd 8)  Metformin Hcl 500 Mg Tabs (Metformin Hcl) .Marland Kitchen.. 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   Mouth:  Erythematous throat and intranasal mucosa c/w URI   Lungs:  Normal respiratory effort, chest  expands symmetrically. Lungs are clear to auscultation, no crackles or wheezes. Heart:  Normal rate and regular rhythm. S1 and S2 normal without gallop, murmur, click, rub or other extra sounds. Abdomen:  Bowel sounds positive,abdomen soft and non-tender without masses, organomegaly or hernias noted. Msk:  No deformity or scoliosis noted of thoracic or lumbar spine.   Extremities:  No clubbing, cyanosis, edema, or deformity noted with normal full range of motion of all joints.   Neurologic:  No cranial nerve deficits noted. Station and gait are normal. Plantar reflexes are down-going bilaterally. DTRs are symmetrical throughout. Sensory, motor and coordinative functions appear intact. Skin:  no rash hyperpigm R anter shin patch Psych:  Cognition and judgment appear intact. Alert and cooperative with normal attention span and concentration. No apparent delusions, illusions, hallucinations   Impression & Recommendations:  Problem # 1:  DIABETES MELLITUS, TYPE II (ICD-250.00) Assessment Deteriorated  The following medications were removed from the medication list:    Metformin Hcl 500 Mg Tabs (Metformin hcl) .Marland Kitchen... 1 by mouth three times a day for diabetes His updated medication list for this problem includes:    Benazepril Hcl 40 Mg Tabs (Benazepril hcl) .Marland Kitchen... 1 by mouth qd    Kombiglyze Xr 2.08-998 Mg Xr24h-tab (Saxagliptin-metformin) .Marland Kitchen... 2 by mouth two times a day for diabetes  Labs Reviewed: Creat: 1.0 (06/07/2010)  Reviewed HgBA1c results: 7.6 (06/07/2010)  7.0 (02/01/2010)  Problem # 2:  UPPER RESPIRATORY INFECTION, ACUTE (ICD-465.9) Assessment: New Augmentin Sudafed His updated medication list for this problem includes:    Naprosyn 500 Mg Tabs (Naproxen) .Marland Kitchen... 1 two times a day pc as needed pain    Promethazine-codeine 6.25-10 Mg/52ml Syrp (Promethazine-codeine) .Marland Kitchen... 5-10 ml by mouth q id as needed cough  Problem # 3:  OBESITY (ICD-278.00) Assessment:  Deteriorated  Problem # 4:  HYPERTENSION (ICD-401.9) Assessment: Unchanged  His updated medication list for this problem includes:    Benazepril Hcl 40 Mg Tabs (Benazepril hcl) .Marland Kitchen... 1 by mouth qd  Complete Medication List: 1)  Omeprazole 40 Mg Cpdr (Omeprazole) .... Once daily in am 2)  Naprosyn 500 Mg Tabs (Naproxen) .Marland Kitchen.. 1 two times a day pc as needed pain 3)  Vitamin D3 1000 Unit Tabs (Cholecalciferol) .Marland Kitchen.. 1 qd 4)  Fish Oil 1000 Mg Caps (Omega-3 fatty acids) .... Two times a day 5)  Freestyle Test Strp (Glucose blood) .... Test two times a day 6)  Benazepril Hcl 40 Mg Tabs (Benazepril hcl) .Marland Kitchen.. 1 by mouth qd 7)  Kombiglyze Xr 2.08-998 Mg Xr24h-tab (Saxagliptin-metformin) .... 2 by mouth two times a day for diabetes 8)  Augmentin 875-125 Mg Tabs (Amoxicillin-pot clavulanate) .Marland Kitchen.. 1 by mouth bid 9)  Sudafed 12 Hour 120 Mg Xr12h-tab (Pseudoephedrine hcl) .Marland Kitchen.. 1 by mouth two times a day as needed allergies 10)  Promethazine-codeine 6.25-10 Mg/40ml Syrp (Promethazine-codeine) .... 5-10 ml by mouth q id as needed cough  Patient Instructions: 1)  Please schedule a follow-up appointment in 4 months. 2)  BMP prior to visit, ICD-9: 3)  Hepatic Panel prior to visit, ICD-9: 4)  Lipid Panel prior to visit, ICD-9:250.02 5)  HbgA1C prior to visit, ICD-9: 6)  Use over-the-counter medicines for "cold": Tylenol  650mg  or Advil 400mg  every 6 hours  for fever; Delsym or Robutussin for cough. Mucinex  for congestion. Ricola or Halls for sore throat. Office visit if not better or if worse.  Prescriptions: PROMETHAZINE-CODEINE 6.25-10 MG/5ML SYRP (PROMETHAZINE-CODEINE) 5-10 ml by mouth q id as needed cough  #300 ml x 0   Entered and Authorized by:   Tresa Garter MD   Signed by:   Tresa Garter MD on 06/11/2010   Method used:   Print then Give to Patient   RxID:   [id number] SUDAFED 12 HOUR 120 MG XR12H-TAB (PSEUDOEPHEDRINE HCL) 1 by mouth two times a day as needed allergies  #60 x  1   Entered and Authorized by:   Tresa Garter MD   Signed by:   Tresa Garter MD on 06/11/2010   Method used:   Print then Give to Patient   RxID:   [id number] AUGMENTIN 875-125 MG TABS (AMOXICILLIN-POT CLAVULANATE) 1 by mouth bid  #20 x 0   Entered and Authorized by:   Tresa Garter MD   Signed by:   Tresa Garter MD on 06/11/2010   Method used:   Print then Give to Patient   RxID:   [id number] KOMBIGLYZE XR 2.08-998 MG XR24H-TAB (SAXAGLIPTIN-METFORMIN) 2 by mouth two times a day for diabetes  #60 x 11   Entered and Authorized by:   Tresa Garter MD   Signed by:   Tresa Garter MD on 06/11/2010   Method used:   Print then Give to Patient   RxID:   [id number]    Orders Added: 1)  New Patient Level  IV [id number]

## 2010-09-27 ENCOUNTER — Telehealth: Payer: Self-pay | Admitting: *Deleted

## 2010-09-27 NOTE — Telephone Encounter (Signed)
1 po bid is correct. Sorry! Thx 

## 2010-09-27 NOTE — Telephone Encounter (Signed)
rec fax from pharm Re: written Rx dated 06-11-10 for Kombiglyze 2.08-998  2 po bid # 60.  Should the sig be 1 po bid?

## 2010-09-28 NOTE — Telephone Encounter (Signed)
Left detailed mess informing pharmacy of below.  

## 2010-10-11 ENCOUNTER — Other Ambulatory Visit (INDEPENDENT_AMBULATORY_CARE_PROVIDER_SITE_OTHER): Payer: BC Managed Care – PPO

## 2010-10-11 ENCOUNTER — Other Ambulatory Visit: Payer: Self-pay | Admitting: Internal Medicine

## 2010-10-11 LAB — LIPID PANEL
Cholesterol: 179 mg/dL (ref 0–200)
LDL Cholesterol: 114 mg/dL — ABNORMAL HIGH (ref 0–99)
Triglycerides: 160 mg/dL — ABNORMAL HIGH (ref 0.0–149.0)
VLDL: 32 mg/dL (ref 0.0–40.0)

## 2010-10-11 LAB — BASIC METABOLIC PANEL
CO2: 28 mEq/L (ref 19–32)
Chloride: 105 mEq/L (ref 96–112)
Creatinine, Ser: 1.1 mg/dL (ref 0.4–1.5)

## 2010-10-11 LAB — HEPATIC FUNCTION PANEL: Total Bilirubin: 0.7 mg/dL (ref 0.3–1.2)

## 2010-10-15 ENCOUNTER — Encounter: Payer: Self-pay | Admitting: Internal Medicine

## 2010-10-15 ENCOUNTER — Ambulatory Visit (INDEPENDENT_AMBULATORY_CARE_PROVIDER_SITE_OTHER): Payer: BC Managed Care – PPO | Admitting: Internal Medicine

## 2010-10-15 DIAGNOSIS — R5383 Other fatigue: Secondary | ICD-10-CM

## 2010-10-15 DIAGNOSIS — E669 Obesity, unspecified: Secondary | ICD-10-CM

## 2010-10-15 DIAGNOSIS — I10 Essential (primary) hypertension: Secondary | ICD-10-CM

## 2010-10-15 DIAGNOSIS — E785 Hyperlipidemia, unspecified: Secondary | ICD-10-CM

## 2010-10-15 DIAGNOSIS — E119 Type 2 diabetes mellitus without complications: Secondary | ICD-10-CM

## 2010-10-15 DIAGNOSIS — R5381 Other malaise: Secondary | ICD-10-CM

## 2010-10-15 HISTORY — DX: Hyperlipidemia, unspecified: E78.5

## 2010-10-15 MED ORDER — SAXAGLIPTIN-METFORMIN ER 2.5-1000 MG PO TB24: 1.0000 | ORAL_TABLET | Freq: Two times a day (BID) | ORAL | Status: DC

## 2010-10-15 MED ORDER — ASPIRIN EC 81 MG PO TBEC: 81.0000 mg | DELAYED_RELEASE_TABLET | Freq: Every day | ORAL | Status: DC

## 2010-10-15 NOTE — Progress Notes (Signed)
  Subjective:    Patient ID: Andrew Carson, male    DOB: November 11, 1955, 55 y.o.   MRN: [id number]  HPI  The patient presents for a follow-up of  chronic hypertension, chronic dyslipidemia, type 2 diabetes controlled with medicines    Review of Systems  Constitutional: Negative for appetite change, fatigue and unexpected weight change.  HENT: Negative for nosebleeds, congestion, sore throat, sneezing, trouble swallowing and neck pain.   Eyes: Negative for itching and visual disturbance.  Respiratory: Negative for cough.   Cardiovascular: Negative for chest pain, palpitations and leg swelling.  Gastrointestinal: Negative for nausea, diarrhea, blood in stool and abdominal distention.  Genitourinary: Negative for frequency and hematuria.  Musculoskeletal: Negative for back pain, joint swelling and gait problem.  Skin: Negative for rash.  Neurological: Negative for dizziness, tremors, speech difficulty and weakness.  Psychiatric/Behavioral: Negative for sleep disturbance, dysphoric mood and agitation. The patient is not nervous/anxious.    Wt Readings from Last 3 Encounters:  10/15/10 319 lb (144.697 kg)  06/11/10 320 lb (145.151 kg)  02/05/10 313 lb (141.976 kg)       Objective:   Physical Exam  Constitutional: He is oriented to person, place, and time. He appears well-developed.       Obese  HENT:  Mouth/Throat: Oropharynx is clear and moist.  Eyes: Conjunctivae are normal. Pupils are equal, round, and reactive to light.  Neck: Normal range of motion. No JVD present. No thyromegaly present.  Cardiovascular: Normal rate, regular rhythm, normal heart sounds and intact distal pulses.  Exam reveals no gallop and no friction rub.   No murmur heard. Pulmonary/Chest: Effort normal and breath sounds normal. No respiratory distress. He has no wheezes. He has no rales. He exhibits no tenderness.  Abdominal: Soft. Bowel sounds are normal. He exhibits no distension and no mass. There is no  tenderness. There is no rebound and no guarding.  Musculoskeletal: Normal range of motion. He exhibits no edema and no tenderness.  Lymphadenopathy:    He has no cervical adenopathy.  Neurological: He is alert and oriented to person, place, and time. He has normal reflexes. No cranial nerve deficit. He exhibits normal muscle tone. Coordination normal.  Skin: Skin is warm and dry. No rash noted.  Psychiatric: He has a normal mood and affect. His behavior is normal. Judgment and thought content normal.         Lab Results  Component Value Date   WBC 8.1 08/22/2008   HGB 14.5 08/22/2008   HCT 41.8 08/22/2008   PLT 257.0 08/22/2008   CHOL 179 10/11/2010   TRIG 160.0* 10/11/2010   HDL 33.30* 10/11/2010   LDLDIRECT 149.9 03/12/2007   ALT 38 10/11/2010   AST 20 10/11/2010   NA 139 10/11/2010   K 4.2 10/11/2010   CL 105 10/11/2010   CREATININE 1.1 10/11/2010   BUN 20 10/11/2010   CO2 28 10/11/2010   TSH 1.88 08/22/2008   PSA 1.01 08/22/2008   HGBA1C 7.0* 10/11/2010    Assessment & Plan:

## 2010-10-15 NOTE — Assessment & Plan Note (Signed)
Start ASA

## 2010-10-15 NOTE — Assessment & Plan Note (Signed)
Cont w/diet 

## 2010-10-15 NOTE — Assessment & Plan Note (Signed)
Cont Rx 

## 2010-10-15 NOTE — Assessment & Plan Note (Signed)
Better  

## 2010-10-29 ENCOUNTER — Inpatient Hospital Stay (INDEPENDENT_AMBULATORY_CARE_PROVIDER_SITE_OTHER)
Admission: RE | Admit: 2010-10-29 | Discharge: 2010-10-29 | Disposition: A | Discharge status: Home / Self Care | Payer: BC Managed Care – PPO | Source: Ambulatory Visit | Attending: Emergency Medicine | Admitting: Emergency Medicine

## 2010-10-29 DIAGNOSIS — S61209A Unspecified open wound of unspecified finger without damage to nail, initial encounter: Secondary | ICD-10-CM

## 2011-02-07 ENCOUNTER — Other Ambulatory Visit (INDEPENDENT_AMBULATORY_CARE_PROVIDER_SITE_OTHER): Payer: BC Managed Care – PPO

## 2011-02-07 DIAGNOSIS — I10 Essential (primary) hypertension: Secondary | ICD-10-CM

## 2011-02-07 DIAGNOSIS — R5383 Other fatigue: Secondary | ICD-10-CM

## 2011-02-07 DIAGNOSIS — R5381 Other malaise: Secondary | ICD-10-CM

## 2011-02-07 DIAGNOSIS — E119 Type 2 diabetes mellitus without complications: Secondary | ICD-10-CM

## 2011-02-07 DIAGNOSIS — E785 Hyperlipidemia, unspecified: Secondary | ICD-10-CM

## 2011-02-07 LAB — COMPREHENSIVE METABOLIC PANEL
Albumin: 3.9 g/dL (ref 3.5–5.2)
Alkaline Phosphatase: 49 U/L (ref 39–117)
BUN: 19 mg/dL (ref 6–23)
CO2: 28 mEq/L (ref 19–32)
GFR: 69.54 mL/min (ref >60.00)
Glucose, Bld: 137 mg/dL — ABNORMAL HIGH (ref 70–99)
Potassium: 4.3 mEq/L (ref 3.5–5.1)
Total Bilirubin: 0.3 mg/dL (ref 0.3–1.2)

## 2011-02-07 LAB — TESTOSTERONE: Testosterone: 161.11 ng/dL — ABNORMAL LOW (ref 350.00–890.00)

## 2011-02-07 LAB — HEMOGLOBIN A1C: Hgb A1c MFr Bld: 6.5 % (ref 4.6–6.5)

## 2011-02-11 ENCOUNTER — Ambulatory Visit (INDEPENDENT_AMBULATORY_CARE_PROVIDER_SITE_OTHER)
Admission: RE | Admit: 2011-02-11 | Discharge: 2011-02-11 | Disposition: A | Discharge status: Home / Self Care | Payer: BC Managed Care – PPO | Source: Ambulatory Visit | Attending: Internal Medicine | Admitting: Internal Medicine

## 2011-02-11 ENCOUNTER — Telehealth: Payer: Self-pay | Admitting: Internal Medicine

## 2011-02-11 ENCOUNTER — Encounter: Payer: Self-pay | Admitting: Internal Medicine

## 2011-02-11 ENCOUNTER — Ambulatory Visit (INDEPENDENT_AMBULATORY_CARE_PROVIDER_SITE_OTHER): Payer: BC Managed Care – PPO | Admitting: Internal Medicine

## 2011-02-11 VITALS — BP 140/90 | HR 64 | Temp 97.5°F | Resp 16 | Wt 323.0 lb

## 2011-02-11 DIAGNOSIS — M79609 Pain in unspecified limb: Secondary | ICD-10-CM

## 2011-02-11 DIAGNOSIS — E119 Type 2 diabetes mellitus without complications: Secondary | ICD-10-CM

## 2011-02-11 DIAGNOSIS — E785 Hyperlipidemia, unspecified: Secondary | ICD-10-CM

## 2011-02-11 DIAGNOSIS — M79644 Pain in right finger(s): Secondary | ICD-10-CM | POA: Insufficient documentation

## 2011-02-11 DIAGNOSIS — E291 Testicular hypofunction: Secondary | ICD-10-CM

## 2011-02-11 DIAGNOSIS — I10 Essential (primary) hypertension: Secondary | ICD-10-CM

## 2011-02-11 HISTORY — DX: Pain in right finger(s): M79.644

## 2011-02-11 MED ORDER — SAXAGLIPTIN-METFORMIN ER 2.5-1000 MG PO TB24: 1.0000 | ORAL_TABLET | Freq: Two times a day (BID) | ORAL | Status: DC

## 2011-02-11 MED ORDER — OMEPRAZOLE 40 MG PO CPDR: 40.0000 mg | DELAYED_RELEASE_CAPSULE | Freq: Every day | ORAL | Status: DC

## 2011-02-11 MED ORDER — NAPROXEN 500 MG PO TABS: 500.0000 mg | ORAL_TABLET | Freq: Two times a day (BID) | ORAL | Status: DC

## 2011-02-11 MED ORDER — BENAZEPRIL HCL 40 MG PO TABS: 40.0000 mg | ORAL_TABLET | Freq: Every day | ORAL | Status: DC

## 2011-02-11 NOTE — Assessment & Plan Note (Signed)
Discussed. He wants to have it re-checked first

## 2011-02-11 NOTE — Telephone Encounter (Signed)
Left detailed mess informing pt of below.  

## 2011-02-11 NOTE — Telephone Encounter (Signed)
Andrew Carson, please, inform patient that there is no fx Let me know if not better in 3-4 wks Thx

## 2011-02-11 NOTE — Assessment & Plan Note (Addendum)
X ray is ok Ortho if not better soon

## 2011-02-11 NOTE — Assessment & Plan Note (Signed)
Continue with current prescription therapy as reflected on the Med list. Loose wt BP Readings from Last 3 Encounters:  02/11/11 140/90  10/15/10 130/86  06/11/10 138/80

## 2011-02-11 NOTE — Assessment & Plan Note (Signed)
On fish oil 

## 2011-02-11 NOTE — Assessment & Plan Note (Signed)
Better Continue with current prescription therapy as reflected on the Med list.  

## 2011-02-11 NOTE — Progress Notes (Signed)
  Subjective:    Patient ID: Andrew Carson, male    DOB: [birth year], 55 y.o.   MRN: [id number]  HPI  The patient presents for a follow-up of  chronic hypertension, chronic dyslipidemia, type 2 diabetes controlled with medicines C/o R 4th finger injury 2 mo ago    Review of Systems  Constitutional: Negative for appetite change, fatigue and unexpected weight change.  HENT: Negative for nosebleeds, congestion, sore throat, sneezing, trouble swallowing and neck pain.   Eyes: Negative for itching and visual disturbance.  Respiratory: Negative for cough.   Cardiovascular: Negative for chest pain, palpitations and leg swelling.  Gastrointestinal: Negative for nausea, diarrhea, blood in stool and abdominal distention.  Genitourinary: Negative for frequency and hematuria.  Musculoskeletal: Negative for back pain, joint swelling and gait problem.  Skin: Negative for rash.  Neurological: Negative for dizziness, tremors, speech difficulty and weakness.  Psychiatric/Behavioral: Negative for sleep disturbance, dysphoric mood and agitation. The patient is not nervous/anxious.        Objective:   Physical Exam  Constitutional: He is oriented to person, place, and time. He appears well-developed.       obese  HENT:  Mouth/Throat: Oropharynx is clear and moist.  Eyes: Conjunctivae are normal. Pupils are equal, round, and reactive to light.  Neck: Normal range of motion. No JVD present. No thyromegaly present.  Cardiovascular: Normal rate, regular rhythm, normal heart sounds and intact distal pulses.  Exam reveals no gallop and no friction rub.   No murmur heard. Pulmonary/Chest: Effort normal and breath sounds normal. No respiratory distress. He has no wheezes. He has no rales. He exhibits no tenderness.  Abdominal: Soft. Bowel sounds are normal. He exhibits no distension and no mass. There is no tenderness. There is no rebound and no guarding.  Musculoskeletal: Normal range of motion. He  exhibits edema and tenderness.       r 4th finger prox swelling  Lymphadenopathy:    He has no cervical adenopathy.  Neurological: He is alert and oriented to person, place, and time. He has normal reflexes. No cranial nerve deficit. He exhibits normal muscle tone. Coordination normal.  Skin: Skin is warm and dry. No rash noted.  Psychiatric: He has a normal mood and affect. His behavior is normal. Judgment and thought content normal.          Assessment & Plan:

## 2011-06-16 ENCOUNTER — Other Ambulatory Visit: Payer: Self-pay | Admitting: Internal Medicine

## 2011-06-17 ENCOUNTER — Ambulatory Visit: Payer: BC Managed Care – PPO | Admitting: Internal Medicine

## 2011-07-19 ENCOUNTER — Telehealth: Payer: Self-pay | Admitting: *Deleted

## 2011-07-19 MED ORDER — LINAGLIPTIN-METFORMIN HCL 2.5-1000 MG PO TABS: 1.0000 | ORAL_TABLET | Freq: Two times a day (BID) | ORAL | Status: DC

## 2011-07-19 NOTE — Telephone Encounter (Signed)
Pt informed. Everything is upfront for p/u. He is requesting a written Rx for his labs to take to [facility] to be drawn. Please advise what labs?

## 2011-07-19 NOTE — Telephone Encounter (Signed)
Ok see Rx and samples Thx

## 2011-07-19 NOTE — Telephone Encounter (Signed)
Rec fax stating Kombiglyze is no longer covered. Please change to Janumet, Janumet XR or Jentadueto. Please advise.

## 2011-07-20 NOTE — Telephone Encounter (Signed)
Ok Thx 

## 2011-07-29 ENCOUNTER — Ambulatory Visit: Payer: BC Managed Care – PPO | Admitting: Internal Medicine

## 2011-08-19 ENCOUNTER — Ambulatory Visit (INDEPENDENT_AMBULATORY_CARE_PROVIDER_SITE_OTHER): Payer: BC Managed Care – PPO | Admitting: Internal Medicine

## 2011-08-19 ENCOUNTER — Encounter: Payer: Self-pay | Admitting: Internal Medicine

## 2011-08-19 VITALS — BP 118/76 | HR 80 | Temp 98.4°F | Resp 16 | Wt 318.0 lb

## 2011-08-19 DIAGNOSIS — I10 Essential (primary) hypertension: Secondary | ICD-10-CM

## 2011-08-19 DIAGNOSIS — I872 Venous insufficiency (chronic) (peripheral): Secondary | ICD-10-CM

## 2011-08-19 DIAGNOSIS — E119 Type 2 diabetes mellitus without complications: Secondary | ICD-10-CM

## 2011-08-19 DIAGNOSIS — E559 Vitamin D deficiency, unspecified: Secondary | ICD-10-CM

## 2011-08-19 DIAGNOSIS — E291 Testicular hypofunction: Secondary | ICD-10-CM

## 2011-08-19 HISTORY — DX: Venous insufficiency (chronic) (peripheral): I87.2

## 2011-08-19 MED ORDER — OMEPRAZOLE 20 MG PO CPDR: 20.0000 mg | DELAYED_RELEASE_CAPSULE | Freq: Every day | ORAL | Status: DC

## 2011-08-19 NOTE — Assessment & Plan Note (Signed)
Continue with current prescription therapy as reflected on the Med list.  

## 2011-08-19 NOTE — Patient Instructions (Signed)
Wt Readings from Last 3 Encounters:  08/19/11 318 lb (144.244 kg)  02/11/11 323 lb (146.512 kg)  10/15/10 319 lb (144.697 kg)   BP Readings from Last 3 Encounters:  08/19/11 118/76  02/11/11 140/90  10/15/10 130/86

## 2011-08-19 NOTE — Progress Notes (Signed)
Patient ID: Andrew Carson, male   DOB: April 18, 1955, 56 y.o.   MRN: [id number]  Subjective:    Patient ID: Andrew Carson, male    DOB: Oct 10, 1955, 56 y.o.   MRN: [id number]  HPI  The patient presents for a follow-up of  chronic hypertension, obesity, chronic dyslipidemia, type 2 diabetes controlled with medicines He had labs at [facility] recently 4/17    Review of Systems  Constitutional: Negative for appetite change, fatigue and unexpected weight change.  HENT: Negative for nosebleeds, congestion, sore throat, sneezing, trouble swallowing and neck pain.   Eyes: Negative for itching and visual disturbance.  Respiratory: Negative for cough.   Cardiovascular: Negative for chest pain, palpitations and leg swelling.  Gastrointestinal: Negative for nausea, diarrhea, blood in stool and abdominal distention.  Genitourinary: Negative for frequency and hematuria.  Musculoskeletal: Negative for back pain, joint swelling and gait problem.  Skin: Negative for rash.  Neurological: Negative for dizziness, tremors, speech difficulty and weakness.  Psychiatric/Behavioral: Negative for sleep disturbance, dysphoric mood and agitation. The patient is not nervous/anxious.        Objective:   Physical Exam  Constitutional: He is oriented to person, place, and time. He appears well-developed.       obese  HENT:  Mouth/Throat: Oropharynx is clear and moist.  Eyes: Conjunctivae are normal. Pupils are equal, round, and reactive to light.  Neck: Normal range of motion. No JVD present. No thyromegaly present.  Cardiovascular: Normal rate, regular rhythm, normal heart sounds and intact distal pulses.  Exam reveals no gallop and no friction rub.   No murmur heard. Pulmonary/Chest: Effort normal and breath sounds normal. No respiratory distress. He has no wheezes. He has no rales. He exhibits no tenderness.  Abdominal: Soft. Bowel sounds are normal. He exhibits no distension and no mass. There is no  tenderness. There is no rebound and no guarding.  Musculoskeletal: Normal range of motion. He exhibits edema (trace B). He exhibits no tenderness.  Lymphadenopathy:    He has no cervical adenopathy.  Neurological: He is alert and oriented to person, place, and time. He has normal reflexes. No cranial nerve deficit. He exhibits normal muscle tone. Coordination normal.  Skin: Skin is warm and dry. No rash noted.       R shin w/hyperpigm  Psychiatric: He has a normal mood and affect. His behavior is normal. Judgment and thought content normal.  trace edema   Lab Results  Component Value Date   WBC 8.1 08/22/2008   HGB 14.5 08/22/2008   HCT 41.8 08/22/2008   PLT 257.0 08/22/2008   GLUCOSE 137* 02/07/2011   CHOL 179 10/11/2010   TRIG 160.0* 10/11/2010   HDL 33.30* 10/11/2010   LDLDIRECT 149.9 03/12/2007   LDLCALC 114* 10/11/2010   ALT 37 02/07/2011   AST 18 02/07/2011   NA 139 02/07/2011   K 4.3 02/07/2011   CL 104 02/07/2011   CREATININE 1.2 02/07/2011   BUN 19 02/07/2011   CO2 28 02/07/2011   TSH 1.88 08/22/2008   PSA 1.01 08/22/2008   HGBA1C 6.5 02/07/2011        Assessment & Plan:   Labs ordered from [facility] in 4 mo

## 2011-08-19 NOTE — Assessment & Plan Note (Addendum)
Options discussed. We are getting lab results from [facility]

## 2011-08-29 ENCOUNTER — Encounter: Payer: Self-pay | Admitting: Internal Medicine

## 2011-12-13 ENCOUNTER — Other Ambulatory Visit: Payer: Self-pay | Admitting: *Deleted

## 2011-12-13 MED ORDER — LINAGLIPTIN-METFORMIN HCL 2.5-1000 MG PO TABS: 1.0000 | ORAL_TABLET | Freq: Two times a day (BID) | ORAL | Status: DC

## 2011-12-23 ENCOUNTER — Ambulatory Visit: Payer: BC Managed Care – PPO | Admitting: Internal Medicine

## 2012-01-06 ENCOUNTER — Encounter: Payer: Self-pay | Admitting: Internal Medicine

## 2012-01-06 ENCOUNTER — Ambulatory Visit (INDEPENDENT_AMBULATORY_CARE_PROVIDER_SITE_OTHER): Payer: BC Managed Care – PPO | Admitting: Internal Medicine

## 2012-01-06 VITALS — BP 130/90 | HR 80 | Temp 98.4°F | Resp 16 | Wt 319.0 lb

## 2012-01-06 DIAGNOSIS — E669 Obesity, unspecified: Secondary | ICD-10-CM

## 2012-01-06 DIAGNOSIS — I872 Venous insufficiency (chronic) (peripheral): Secondary | ICD-10-CM

## 2012-01-06 DIAGNOSIS — E119 Type 2 diabetes mellitus without complications: Secondary | ICD-10-CM

## 2012-01-06 DIAGNOSIS — E291 Testicular hypofunction: Secondary | ICD-10-CM

## 2012-01-06 MED ORDER — LINAGLIPTIN-METFORMIN HCL 2.5-1000 MG PO TABS: 1.0000 | ORAL_TABLET | Freq: Two times a day (BID) | ORAL | Status: DC

## 2012-01-06 MED ORDER — BENAZEPRIL HCL 40 MG PO TABS: 40.0000 mg | ORAL_TABLET | Freq: Every day | ORAL | Status: DC

## 2012-01-06 MED ORDER — OMEPRAZOLE 20 MG PO CPDR: 20.0000 mg | DELAYED_RELEASE_CAPSULE | Freq: Every day | ORAL | Status: DC

## 2012-01-06 MED ORDER — VASCULERA PO TABS: 1.0000 | ORAL_TABLET | Freq: Two times a day (BID) | ORAL | Status: DC

## 2012-01-06 NOTE — Assessment & Plan Note (Signed)
Not on rx 

## 2012-01-06 NOTE — Assessment & Plan Note (Signed)
Continue with current prescription therapy as reflected on the Med list. Labs ordered - will try to get results from 9/13

## 2012-01-06 NOTE — Progress Notes (Signed)
   Subjective:    Patient ID: Andrew Carson, male    DOB: 1956/03/01, 56 y.o.   MRN: [id number]  HPI  The patient presents for a follow-up of  chronic hypertension, obesity, chronic dyslipidemia, type 2 diabetes controlled with medicines He had labs at [facility] recently 9/13   BP Readings from Last 3 Encounters:  01/06/12 130/90  08/19/11 118/76  02/11/11 140/90   Wt Readings from Last 3 Encounters:  01/06/12 319 lb (144.697 kg)  08/19/11 318 lb (144.244 kg)  02/11/11 323 lb (146.512 kg)     Review of Systems  Constitutional: Negative for appetite change, fatigue and unexpected weight change.  HENT: Negative for nosebleeds, congestion, sore throat, sneezing, trouble swallowing and neck pain.   Eyes: Negative for itching and visual disturbance.  Respiratory: Negative for cough.   Cardiovascular: Negative for chest pain, palpitations and leg swelling.  Gastrointestinal: Negative for nausea, diarrhea, blood in stool and abdominal distention.  Genitourinary: Negative for frequency and hematuria.  Musculoskeletal: Negative for back pain, joint swelling and gait problem.  Skin: Negative for rash.  Neurological: Negative for dizziness, tremors, speech difficulty and weakness.  Psychiatric/Behavioral: Negative for disturbed wake/sleep cycle, dysphoric mood and agitation. The patient is not nervous/anxious.        Objective:   Physical Exam  Constitutional: He is oriented to person, place, and time. He appears well-developed.       obese  HENT:  Mouth/Throat: Oropharynx is clear and moist.  Eyes: Conjunctivae normal are normal. Pupils are equal, round, and reactive to light.  Neck: Normal range of motion. No JVD present. No thyromegaly present.  Cardiovascular: Normal rate, regular rhythm, normal heart sounds and intact distal pulses.  Exam reveals no gallop and no friction rub.   No murmur heard. Pulmonary/Chest: Effort normal and breath sounds normal. No respiratory  distress. He has no wheezes. He has no rales. He exhibits no tenderness.  Abdominal: Soft. Bowel sounds are normal. He exhibits no distension and no mass. There is no tenderness. There is no rebound and no guarding.  Musculoskeletal: Normal range of motion. He exhibits edema (trace B). He exhibits no tenderness.  Lymphadenopathy:    He has no cervical adenopathy.  Neurological: He is alert and oriented to person, place, and time. He has normal reflexes. No cranial nerve deficit. He exhibits normal muscle tone. Coordination normal.  Skin: Skin is warm and dry. No rash noted.       R shin w/hyperpigm  Psychiatric: He has a normal mood and affect. His behavior is normal. Judgment and thought content normal.  trace edema   Labs from [facility]      Assessment & Plan:   Labs ordered from [facility] in 4 mo

## 2012-01-06 NOTE — Assessment & Plan Note (Signed)
R shin dermatitis (post-traumatic)

## 2012-01-06 NOTE — Assessment & Plan Note (Signed)
  On diet  

## 2012-01-09 ENCOUNTER — Telehealth: Payer: Self-pay | Admitting: *Deleted

## 2012-01-09 ENCOUNTER — Encounter: Payer: Self-pay | Admitting: Internal Medicine

## 2012-01-09 NOTE — Telephone Encounter (Signed)
Pt informed recent labs from [facility] are ok per Dr. Posey Rea.

## 2012-02-11 ENCOUNTER — Other Ambulatory Visit: Payer: Self-pay | Admitting: Internal Medicine

## 2012-05-11 ENCOUNTER — Encounter: Payer: Self-pay | Admitting: Internal Medicine

## 2012-05-11 ENCOUNTER — Ambulatory Visit (INDEPENDENT_AMBULATORY_CARE_PROVIDER_SITE_OTHER): Payer: BC Managed Care – PPO | Admitting: Internal Medicine

## 2012-05-11 VITALS — BP 142/108 | HR 80 | Temp 98.5°F | Resp 16 | Wt 326.0 lb

## 2012-05-11 DIAGNOSIS — E119 Type 2 diabetes mellitus without complications: Secondary | ICD-10-CM

## 2012-05-11 DIAGNOSIS — E669 Obesity, unspecified: Secondary | ICD-10-CM

## 2012-05-11 MED ORDER — BENAZEPRIL HCL 40 MG PO TABS: 40.0000 mg | ORAL_TABLET | Freq: Every day | ORAL | Status: DC

## 2012-05-11 MED ORDER — OMEPRAZOLE 20 MG PO CPDR: 20.0000 mg | DELAYED_RELEASE_CAPSULE | Freq: Every day | ORAL | Status: DC

## 2012-05-11 MED ORDER — NAPROXEN 500 MG PO TABS: 500.0000 mg | ORAL_TABLET | Freq: Two times a day (BID) | ORAL | Status: DC

## 2012-05-11 MED ORDER — LINAGLIPTIN-METFORMIN HCL 2.5-1000 MG PO TABS: 1.0000 | ORAL_TABLET | Freq: Two times a day (BID) | ORAL | Status: DC

## 2012-05-11 NOTE — Assessment & Plan Note (Signed)
Chronic He denies OSA LapBand discussed and the pt declined

## 2012-05-11 NOTE — Assessment & Plan Note (Signed)
Continue with current prescription therapy as reflected on the Med list. Will get labs

## 2012-05-11 NOTE — Progress Notes (Signed)
Patient ID: Andrew Carson, male   DOB: 01/16/1956, 57 y.o.   MRN: [id number]   Subjective:    HPI  The patient presents for a follow-up of  chronic hypertension, obesity, chronic dyslipidemia, type 2 diabetes controlled with medicines He had labs at [facility] recently 9/13   BP Readings from Last 3 Encounters:  05/11/12 142/108  01/06/12 130/90  08/19/11 118/76   Wt Readings from Last 3 Encounters:  05/11/12 326 lb (147.873 kg)  01/06/12 319 lb (144.697 kg)  08/19/11 318 lb (144.244 kg)     Review of Systems  Constitutional: Negative for appetite change, fatigue and unexpected weight change.  HENT: Negative for nosebleeds, congestion, sore throat, sneezing, trouble swallowing and neck pain.   Eyes: Negative for itching and visual disturbance.  Respiratory: Negative for cough.   Cardiovascular: Negative for chest pain, palpitations and leg swelling.  Gastrointestinal: Negative for nausea, diarrhea, blood in stool and abdominal distention.  Genitourinary: Negative for frequency and hematuria.  Musculoskeletal: Negative for back pain, joint swelling and gait problem.  Skin: Negative for rash.  Neurological: Negative for dizziness, tremors, speech difficulty and weakness.  Psychiatric/Behavioral: Negative for sleep disturbance, dysphoric mood and agitation. The patient is not nervous/anxious.        Objective:   Physical Exam  Constitutional: He is oriented to person, place, and time. He appears well-developed.       obese  HENT:  Mouth/Throat: Oropharynx is clear and moist.  Eyes: Conjunctivae normal are normal. Pupils are equal, round, and reactive to light.  Neck: Normal range of motion. No JVD present. No thyromegaly present.  Cardiovascular: Normal rate, regular rhythm, normal heart sounds and intact distal pulses.  Exam reveals no gallop and no friction rub.   No murmur heard. Pulmonary/Chest: Effort normal and breath sounds normal. No respiratory distress. He has  no wheezes. He has no rales. He exhibits no tenderness.  Abdominal: Soft. Bowel sounds are normal. He exhibits no distension and no mass. There is no tenderness. There is no rebound and no guarding.  Musculoskeletal: Normal range of motion. He exhibits edema (trace B). He exhibits no tenderness.  Lymphadenopathy:    He has no cervical adenopathy.  Neurological: He is alert and oriented to person, place, and time. He has normal reflexes. No cranial nerve deficit. He exhibits normal muscle tone. Coordination normal.  Skin: Skin is warm and dry. No rash noted.       R shin w/hyperpigm  Psychiatric: He has a normal mood and affect. His behavior is normal. Judgment and thought content normal.  trace edema   Labs from [facility]      Assessment & Plan:   Labs ordered from [facility] in 4 mo

## 2012-05-15 ENCOUNTER — Encounter: Payer: Self-pay | Admitting: Internal Medicine

## 2012-09-07 ENCOUNTER — Encounter: Payer: Self-pay | Admitting: Internal Medicine

## 2012-09-07 ENCOUNTER — Ambulatory Visit (INDEPENDENT_AMBULATORY_CARE_PROVIDER_SITE_OTHER): Payer: BC Managed Care – PPO | Admitting: Internal Medicine

## 2012-09-07 VITALS — BP 138/90 | HR 76 | Temp 98.0°F | Resp 16 | Wt 322.0 lb

## 2012-09-07 DIAGNOSIS — I872 Venous insufficiency (chronic) (peripheral): Secondary | ICD-10-CM

## 2012-09-07 DIAGNOSIS — E119 Type 2 diabetes mellitus without complications: Secondary | ICD-10-CM

## 2012-09-07 DIAGNOSIS — I10 Essential (primary) hypertension: Secondary | ICD-10-CM

## 2012-09-07 MED ORDER — NAPROXEN 500 MG PO TABS: 500.0000 mg | ORAL_TABLET | Freq: Two times a day (BID) | ORAL | Status: DC

## 2012-09-07 MED ORDER — BENAZEPRIL HCL 40 MG PO TABS: 40.0000 mg | ORAL_TABLET | Freq: Every day | ORAL | Status: DC

## 2012-09-07 MED ORDER — LINAGLIPTIN-METFORMIN HCL 2.5-1000 MG PO TABS: 1.0000 | ORAL_TABLET | Freq: Two times a day (BID) | ORAL | Status: DC

## 2012-09-07 MED ORDER — OMEPRAZOLE 20 MG PO CPDR: 20.0000 mg | DELAYED_RELEASE_CAPSULE | Freq: Every day | ORAL | Status: DC

## 2012-09-07 NOTE — Assessment & Plan Note (Signed)
Continue with current prescription therapy as reflected on the Med list.  

## 2012-09-07 NOTE — Assessment & Plan Note (Signed)
Wt loss  Wt Readings from Last 3 Encounters:  09/07/12 322 lb (146.058 kg)  05/11/12 326 lb (147.873 kg)  01/06/12 319 lb (144.697 kg)

## 2012-09-07 NOTE — Progress Notes (Signed)
Patient ID: Andrew Carson, male   DOB: 08-31-1955, 57 y.o.   MRN: [id number] Patient ID: Andrew Carson, male   DOB: 1955/05/19, 57 y.o.   MRN: [id number]   Subjective:    HPI  The patient presents for a follow-up of  chronic hypertension, obesity, chronic dyslipidemia, type 2 diabetes controlled with medicines He had labs at [facility] recently 9/13   BP Readings from Last 3 Encounters:  09/07/12 138/90  05/11/12 142/108  01/06/12 130/90   Wt Readings from Last 3 Encounters:  09/07/12 322 lb (146.058 kg)  05/11/12 326 lb (147.873 kg)  01/06/12 319 lb (144.697 kg)     Review of Systems  Constitutional: Negative for appetite change, fatigue and unexpected weight change.  HENT: Negative for nosebleeds, congestion, sore throat, sneezing, trouble swallowing and neck pain.   Eyes: Negative for itching and visual disturbance.  Respiratory: Negative for cough.   Cardiovascular: Negative for chest pain, palpitations and leg swelling.  Gastrointestinal: Negative for nausea, diarrhea, blood in stool and abdominal distention.  Genitourinary: Negative for frequency and hematuria.  Musculoskeletal: Negative for back pain, joint swelling and gait problem.  Skin: Negative for rash.  Neurological: Negative for dizziness, tremors, speech difficulty and weakness.  Psychiatric/Behavioral: Negative for sleep disturbance, dysphoric mood and agitation. The patient is not nervous/anxious.        Objective:   Physical Exam  Constitutional: He is oriented to person, place, and time. He appears well-developed.  obese  HENT:  Mouth/Throat: Oropharynx is clear and moist.  Eyes: Conjunctivae are normal. Pupils are equal, round, and reactive to light.  Neck: Normal range of motion. No JVD present. No thyromegaly present.  Cardiovascular: Normal rate, regular rhythm, normal heart sounds and intact distal pulses.  Exam reveals no gallop and no friction rub.   No murmur heard. Pulmonary/Chest: Effort  normal and breath sounds normal. No respiratory distress. He has no wheezes. He has no rales. He exhibits no tenderness.  Abdominal: Soft. Bowel sounds are normal. He exhibits no distension and no mass. There is no tenderness. There is no rebound and no guarding.  Musculoskeletal: Normal range of motion. He exhibits edema (trace B). He exhibits no tenderness.  Lymphadenopathy:    He has no cervical adenopathy.  Neurological: He is alert and oriented to person, place, and time. He has normal reflexes. No cranial nerve deficit. He exhibits normal muscle tone. Coordination normal.  Skin: Skin is warm and dry. No rash noted.  R shin w/hyperpigm  Psychiatric: He has a normal mood and affect. His behavior is normal. Judgment and thought content normal.  trace edema   Labs from [facility]      Assessment & Plan:   Labs ordered from [facility] in 4 mo

## 2012-09-12 ENCOUNTER — Encounter: Payer: Self-pay | Admitting: Internal Medicine

## 2012-09-20 ENCOUNTER — Telehealth: Payer: Self-pay | Admitting: *Deleted

## 2012-09-20 DIAGNOSIS — Z0389 Encounter for observation for other suspected diseases and conditions ruled out: Secondary | ICD-10-CM

## 2012-09-20 DIAGNOSIS — Z0000 Encounter for general adult medical examination without abnormal findings: Secondary | ICD-10-CM

## 2012-09-20 NOTE — Telephone Encounter (Signed)
CPE labs entered.  

## 2012-09-20 NOTE — Telephone Encounter (Signed)
Message copied by Merrilyn Puma on Thu Sep 20, 2012  1:47 PM ------      Message from: Etheleen Sia      Created: Fri Sep 07, 2012  8:12 AM      Regarding: LAB       PHYSICAL LABS FOR SEPT CPX ------

## 2012-12-28 ENCOUNTER — Encounter: Payer: BC Managed Care – PPO | Admitting: Internal Medicine

## 2013-01-07 ENCOUNTER — Encounter: Payer: BC Managed Care – PPO | Admitting: Internal Medicine

## 2013-02-01 ENCOUNTER — Encounter: Payer: BC Managed Care – PPO | Admitting: Internal Medicine

## 2013-02-08 ENCOUNTER — Encounter: Payer: Self-pay | Admitting: Internal Medicine

## 2013-02-08 ENCOUNTER — Ambulatory Visit (INDEPENDENT_AMBULATORY_CARE_PROVIDER_SITE_OTHER): Payer: BC Managed Care – PPO | Admitting: Internal Medicine

## 2013-02-08 VITALS — BP 136/78 | HR 72 | Temp 98.2°F | Resp 16 | Ht 71.0 in | Wt 308.0 lb

## 2013-02-08 DIAGNOSIS — E291 Testicular hypofunction: Secondary | ICD-10-CM

## 2013-02-08 DIAGNOSIS — Z0000 Encounter for general adult medical examination without abnormal findings: Secondary | ICD-10-CM

## 2013-02-08 MED ORDER — NAPROXEN 500 MG PO TABS: 500.0000 mg | ORAL_TABLET | Freq: Two times a day (BID) | ORAL | Status: DC

## 2013-02-08 MED ORDER — DAPAGLIFLOZIN PROPANEDIOL 10 MG PO TABS: 10.0000 mg | ORAL_TABLET | Freq: Every day | ORAL | Status: DC

## 2013-02-08 MED ORDER — BENAZEPRIL HCL 40 MG PO TABS: 40.0000 mg | ORAL_TABLET | Freq: Every day | ORAL | Status: DC

## 2013-02-08 MED ORDER — DOXYCYCLINE HYCLATE 100 MG PO TABS: 100.0000 mg | ORAL_TABLET | Freq: Two times a day (BID) | ORAL | Status: DC

## 2013-02-08 MED ORDER — OMEPRAZOLE 20 MG PO CPDR: 20.0000 mg | DELAYED_RELEASE_CAPSULE | Freq: Every day | ORAL | Status: DC

## 2013-02-08 NOTE — Progress Notes (Signed)
   Subjective:    HPI The patient is here for a wellness exam. The patient has been doing well overall without major physical or psychological issues going on lately. C/o lesions on face, L thigh The patient presents for a follow-up of  chronic hypertension, obesity, chronic dyslipidemia, type 2 diabetes controlled with medicines He had labs at [facility] recently 9/13   BP Readings from Last 3 Encounters:  02/08/13 136/78  09/07/12 138/90  05/11/12 142/108   Wt Readings from Last 3 Encounters:  02/08/13 308 lb (139.708 kg)  09/07/12 322 lb (146.058 kg)  05/11/12 326 lb (147.873 kg)     Review of Systems  Constitutional: Negative for appetite change, fatigue and unexpected weight change.  HENT: Negative for congestion, nosebleeds, sneezing, sore throat and trouble swallowing.   Eyes: Negative for itching and visual disturbance.  Respiratory: Negative for cough.   Cardiovascular: Negative for chest pain, palpitations and leg swelling.  Gastrointestinal: Negative for nausea, diarrhea, blood in stool and abdominal distention.  Genitourinary: Negative for frequency and hematuria.  Musculoskeletal: Negative for back pain, gait problem, joint swelling and neck pain.  Skin: Negative for rash.  Neurological: Negative for dizziness, tremors, speech difficulty and weakness.  Psychiatric/Behavioral: Negative for sleep disturbance, dysphoric mood and agitation. The patient is not nervous/anxious.        Objective:   Physical Exam  Constitutional: He is oriented to person, place, and time. He appears well-developed.  obese  HENT:  Mouth/Throat: Oropharynx is clear and moist.  Eyes: Conjunctivae are normal. Pupils are equal, round, and reactive to light.  Neck: Normal range of motion. No JVD present. No thyromegaly present.  Cardiovascular: Normal rate, regular rhythm, normal heart sounds and intact distal pulses.  Exam reveals no gallop and no friction rub.   No murmur  heard. Pulmonary/Chest: Effort normal and breath sounds normal. No respiratory distress. He has no wheezes. He has no rales. He exhibits no tenderness.  Abdominal: Soft. Bowel sounds are normal. He exhibits no distension and no mass. There is no tenderness. There is no rebound and no guarding.  Musculoskeletal: Normal range of motion. He exhibits edema (trace B). He exhibits no tenderness.  Lymphadenopathy:    He has no cervical adenopathy.  Neurological: He is alert and oriented to person, place, and time. He has normal reflexes. No cranial nerve deficit. He exhibits normal muscle tone. Coordination normal.  Skin: Skin is warm and dry. No rash noted.  R shin w/hyperpigm  Psychiatric: He has a normal mood and affect. His behavior is normal. Judgment and thought content normal.  trace edema Papules on L heck Scar on L thigh   Labs from [facility]      Assessment & Plan:   Labs ordered from [facility] in 4 mo

## 2013-02-08 NOTE — Patient Instructions (Signed)

## 2013-02-08 NOTE — Assessment & Plan Note (Signed)
See instructions

## 2013-02-19 ENCOUNTER — Encounter: Payer: Self-pay | Admitting: Internal Medicine

## 2013-03-06 ENCOUNTER — Other Ambulatory Visit: Payer: Self-pay | Admitting: Internal Medicine

## 2013-05-06 ENCOUNTER — Other Ambulatory Visit: Payer: Self-pay | Admitting: Internal Medicine

## 2013-05-08 ENCOUNTER — Other Ambulatory Visit: Payer: Self-pay | Admitting: Internal Medicine

## 2013-05-08 LAB — HEMOGLOBIN A1C
HEMOGLOBIN A1C: 6.7 % — AB (ref <5.7)
MEAN PLASMA GLUCOSE: 146 mg/dL — AB (ref <117)

## 2013-05-08 LAB — BASIC METABOLIC PANEL
BUN: 18 mg/dL (ref 6–23)
CALCIUM: 9.4 mg/dL (ref 8.4–10.5)
CO2: 28 mEq/L (ref 19–32)
CREATININE: 0.91 mg/dL (ref 0.50–1.35)
Chloride: 104 mEq/L (ref 96–112)
GLUCOSE: 108 mg/dL — AB (ref 70–99)
POTASSIUM: 4.2 meq/L (ref 3.5–5.3)
Sodium: 139 mEq/L (ref 135–145)

## 2013-05-10 ENCOUNTER — Encounter: Payer: Self-pay | Admitting: Internal Medicine

## 2013-05-10 ENCOUNTER — Ambulatory Visit (INDEPENDENT_AMBULATORY_CARE_PROVIDER_SITE_OTHER): Payer: BC Managed Care – PPO | Admitting: Internal Medicine

## 2013-05-10 VITALS — BP 150/92 | HR 76 | Temp 98.3°F | Resp 16 | Wt 313.0 lb

## 2013-05-10 DIAGNOSIS — M1990 Unspecified osteoarthritis, unspecified site: Secondary | ICD-10-CM

## 2013-05-10 DIAGNOSIS — E119 Type 2 diabetes mellitus without complications: Secondary | ICD-10-CM

## 2013-05-10 DIAGNOSIS — I10 Essential (primary) hypertension: Secondary | ICD-10-CM

## 2013-05-10 DIAGNOSIS — E669 Obesity, unspecified: Secondary | ICD-10-CM

## 2013-05-10 DIAGNOSIS — E559 Vitamin D deficiency, unspecified: Secondary | ICD-10-CM

## 2013-05-10 NOTE — Progress Notes (Signed)
Pre visit review using our clinic review tool, if applicable. No additional management support is needed unless otherwise documented below in the visit note. 

## 2013-05-10 NOTE — Assessment & Plan Note (Signed)
Continue with current prescription therapy as reflected on the Med list.  

## 2013-05-10 NOTE — Assessment & Plan Note (Signed)
Wt Readings from Last 3 Encounters:  05/10/13 313 lb (141.976 kg)  02/08/13 308 lb (139.708 kg)  09/07/12 322 lb (146.058 kg)    

## 2013-05-10 NOTE — Patient Instructions (Signed)
Wt Readings from Last 3 Encounters:  05/10/13 313 lb (141.976 kg)  02/08/13 308 lb (139.708 kg)  09/07/12 322 lb (146.058 kg)

## 2013-05-12 ENCOUNTER — Encounter: Payer: Self-pay | Admitting: Internal Medicine

## 2013-05-12 NOTE — Progress Notes (Signed)
   Subjective:    HPI  The patient presents for a follow-up of  chronic hypertension, obesity, chronic dyslipidemia, type 2 diabetes controlled with medicines He had labs at [facility] recently   BP Readings from Last 3 Encounters:  05/10/13 150/92  02/08/13 136/78  09/07/12 138/90   Wt Readings from Last 3 Encounters:  05/10/13 313 lb (141.976 kg)  02/08/13 308 lb (139.708 kg)  09/07/12 322 lb (146.058 kg)     Review of Systems  Constitutional: Negative for appetite change, fatigue and unexpected weight change.  HENT: Negative for congestion, nosebleeds, sneezing, sore throat and trouble swallowing.   Eyes: Negative for itching and visual disturbance.  Respiratory: Negative for cough.   Cardiovascular: Negative for chest pain, palpitations and leg swelling.  Gastrointestinal: Negative for nausea, diarrhea, blood in stool and abdominal distention.  Genitourinary: Negative for frequency and hematuria.  Musculoskeletal: Negative for back pain, gait problem, joint swelling and neck pain.  Skin: Negative for rash.  Neurological: Negative for dizziness, tremors, speech difficulty and weakness.  Psychiatric/Behavioral: Negative for sleep disturbance, dysphoric mood and agitation. The patient is not nervous/anxious.        Objective:   Physical Exam  Constitutional: He is oriented to person, place, and time. He appears well-developed.  obese  HENT:  Mouth/Throat: Oropharynx is clear and moist.  Eyes: Conjunctivae are normal. Pupils are equal, round, and reactive to light.  Neck: Normal range of motion. No JVD present. No thyromegaly present.  Cardiovascular: Normal rate, regular rhythm, normal heart sounds and intact distal pulses.  Exam reveals no gallop and no friction rub.   No murmur heard. Pulmonary/Chest: Effort normal and breath sounds normal. No respiratory distress. He has no wheezes. He has no rales. He exhibits no tenderness.  Abdominal: Soft. Bowel sounds are  normal. He exhibits no distension and no mass. There is no tenderness. There is no rebound and no guarding.  Musculoskeletal: Normal range of motion. He exhibits edema (trace B). He exhibits no tenderness.  Lymphadenopathy:    He has no cervical adenopathy.  Neurological: He is alert and oriented to person, place, and time. He has normal reflexes. No cranial nerve deficit. He exhibits normal muscle tone. Coordination normal.  Skin: Skin is warm and dry. No rash noted.  R shin w/hyperpigm  Psychiatric: He has a normal mood and affect. His behavior is normal. Judgment and thought content normal.  trace edema   Labs from [facility]      Assessment & Plan:

## 2013-05-12 NOTE — Assessment & Plan Note (Signed)
Continue with current prescription therapy as reflected on the Med list.  

## 2013-05-15 ENCOUNTER — Telehealth: Payer: Self-pay

## 2013-05-15 NOTE — Telephone Encounter (Signed)
Relevant patient education assigned to patient using Emmi. ° °

## 2013-07-24 ENCOUNTER — Ambulatory Visit (INDEPENDENT_AMBULATORY_CARE_PROVIDER_SITE_OTHER): Payer: BC Managed Care – PPO | Admitting: Emergency Medicine

## 2013-07-24 VITALS — BP 160/80 | HR 82 | Temp 98.1°F | Resp 16 | Ht 71.0 in | Wt 308.0 lb

## 2013-07-24 DIAGNOSIS — N3000 Acute cystitis without hematuria: Secondary | ICD-10-CM

## 2013-07-24 DIAGNOSIS — IMO0001 Reserved for inherently not codable concepts without codable children: Secondary | ICD-10-CM

## 2013-07-24 DIAGNOSIS — R319 Hematuria, unspecified: Secondary | ICD-10-CM

## 2013-07-24 DIAGNOSIS — R350 Frequency of micturition: Secondary | ICD-10-CM

## 2013-07-24 LAB — POCT URINALYSIS DIPSTICK
Bilirubin, UA: NEGATIVE
Glucose, UA: NEGATIVE
NITRITE UA: POSITIVE
PH UA: 6
Protein, UA: 100
Spec Grav, UA: 1.025
UROBILINOGEN UA: 0.2

## 2013-07-24 LAB — POCT UA - MICROSCOPIC ONLY
CASTS, UR, LPF, POC: NEGATIVE
CRYSTALS, UR, HPF, POC: NEGATIVE
Mucus, UA: NEGATIVE
Yeast, UA: NEGATIVE

## 2013-07-24 LAB — POCT CBC
GRANULOCYTE PERCENT: 62.4 % (ref 37–80)
HEMATOCRIT: 42.3 % — AB (ref 43.5–53.7)
Hemoglobin: 13.4 g/dL — AB (ref 14.1–18.1)
LYMPH, POC: 2 (ref 0.6–3.4)
MCH: 26 pg — AB (ref 27–31.2)
MCHC: 31.7 g/dL — AB (ref 31.8–35.4)
MCV: 81.9 fL (ref 80–97)
MID (cbc): 1 — AB (ref 0–0.9)
MPV: 7.3 fL (ref 0–99.8)
POC Granulocyte: 4.9 (ref 2–6.9)
POC LYMPH %: 25 % (ref 10–50)
POC MID %: 12.6 %M — AB (ref 0–12)
Platelet Count, POC: 384 10*3/uL (ref 142–424)
RBC: 5.16 M/uL (ref 4.69–6.13)
RDW, POC: 15.1 %
WBC: 7.9 10*3/uL (ref 4.6–10.2)

## 2013-07-24 MED ORDER — PHENAZOPYRIDINE HCL 200 MG PO TABS: 200.0000 mg | ORAL_TABLET | Freq: Three times a day (TID) | ORAL | Status: DC | PRN

## 2013-07-24 MED ORDER — SULFAMETHOXAZOLE-TMP DS 800-160 MG PO TABS
1.0000 | ORAL_TABLET | Freq: Two times a day (BID) | ORAL | Status: DC
Start: 2013-07-24 — End: 2013-09-06

## 2013-07-24 NOTE — Progress Notes (Signed)
[facility] [address], [place] [address] [phone]  Date:  07/24/2013   Name:  Andrew Carson   DOB:  1956-03-04   MRN:  [id number]  PCP:  Sonda Primes, MD    Chief Complaint: Urinary Frequency   History of Present Illness:  Andrew Carson is a 58 y.o. very pleasant male patient who presents with the following:  Has urgency and urgency incontinence that is new over the past 2-3 weeks.  Had URI and took OTC antihistamines preceding this event.  Had some blood in his urine.  No fever or chills.  No dysuria.  Has nocturia that awakens him ever two hours at least.  No nausea or vomiting no stool change no rash.  No symptoms of prostatism preceding this event.  No improvement with over the counter medications or other home remedies. Denies other complaint or health concern today.   Patient Active Problem List   Diagnosis Date Noted  . Venous insufficiency 08/19/2011  . Hypogonadism male 02/11/2011  . Finger pain, right 02/11/2011  . Dyslipidemia (high LDL; low HDL) 10/15/2010  . UPPER RESPIRATORY INFECTION, ACUTE 06/11/2010  . DIABETES MELLITUS, TYPE II 02/05/2010  . URTICARIA 06/05/2009  . OBESITY 08/24/2007  . ABNORMAL GLUCOSE NEC 08/24/2007  . VITAMIN D DEFICIENCY 03/14/2007  . GERD 03/12/2007  . OSTEOARTHRITIS 03/12/2007  . KNEE PAIN 03/12/2007  . HYPERTENSION 02/23/2007    Past Medical History  Diagnosis Date  . Hypertension   . GERD (gastroesophageal reflux disease)   . Diabetes mellitus 2011    type 2  . Osteoarthritis   . Obesity     Past Surgical History  Procedure Laterality Date  . Lumbar laminectomy  1997    History  Substance Use Topics  . Smoking status: Never Smoker   . Smokeless tobacco: Not on file  . Alcohol Use: No    Family History  Problem Relation Age of Onset  . Kidney disease Other   . COPD Mother   . Heart disease Mother 65    MI  . Heart disease Father 89    MI    No Known  Allergies  Medication list has been reviewed and updated.  Current Outpatient Prescriptions on File Prior to Visit  Medication Sig Dispense Refill  . benazepril (LOTENSIN) 40 MG tablet Take 1 tablet (40 mg total) by mouth daily.  90 tablet  3  . Cholecalciferol (VITAMIN D3) 1000 UNITS tablet Take 1,000 Units by mouth daily.        . Dapagliflozin Propanediol (FARXIGA) 10 MG TABS Take 10 mg by mouth daily.  30 tablet  11  . Dietary Management Product (VASCULERA) TABS Take 1 tablet by mouth 2 (two) times daily.  60 tablet  11  . doxycycline (VIBRA-TABS) 100 MG tablet Take 1 tablet (100 mg total) by mouth 2 (two) times daily.  20 tablet  0  . naproxen (NAPROSYN) 500 MG tablet Take 1 tablet (500 mg total) by mouth 2 (two) times daily with a meal.  180 tablet  3  . Omega-3 Fatty Acids (FISH OIL) 1000 MG CAPS Take by mouth 2 (two) times daily as needed.        Marland Kitchen omeprazole (PRILOSEC) 20 MG capsule Take 1 capsule (20 mg total) by mouth daily.  90 capsule  3  . aspirin EC 81 MG tablet Take 81 mg by mouth daily.      Marland Kitchen glucose blood test strip 1 each by  Other route 2 (two) times daily. Use as instructed       . JENTADUETO 2.08-998 MG TABS TAKE 1 TABLET BY MOUTH 2 (TWO) TIMES DAILY.  180 tablet  1   No current facility-administered medications on file prior to visit.    Review of Systems:  As per HPI, otherwise negative.    Physical Examination: Filed Vitals:   07/24/13 1348  BP: 160/80  Pulse: 82  Temp: 98.1 F (36.7 C)  Resp: 16   Filed Vitals:   07/24/13 1348  Height: 5\' 11"  (1.803 m)  Weight: 308 lb (139.708 kg)   Body mass index is 42.98 kg/(m^2). Ideal Body Weight: Weight in (lb) to have BMI = 25: 178.9  GEN: WDWN, NAD, Non-toxic, A & O x 3 HEENT: Atraumatic, Normocephalic. Neck supple. No masses, No LAD. Ears and Nose: No external deformity. CV: RRR, No M/G/R. No JVD. No thrill. No extra heart sounds. PULM: CTA B, no wheezes, crackles, rhonchi. No retractions. No resp.  distress. No accessory muscle use. ABD: S, NT, ND, +BS. No rebound. No HSM. EXTR: No c/c/e NEURO Normal gait.  PSYCH: Normally interactive. Conversant. Not depressed or anxious appearing.  Calm demeanor.    Assessment and Plan: Acute cystitis Septra Pyridium  Signed,  Phillips OdorJeffery Anderson, MD   Results for orders placed in visit on 07/24/13  POCT URINALYSIS DIPSTICK      Result Value Ref Range   Color, UA yellow     Clarity, UA clear     Glucose, UA neg     Bilirubin, UA neg     Ketones, UA trace     Spec Grav, UA 1.025     Blood, UA large     pH, UA 6.0     Protein, UA 100     Urobilinogen, UA 0.2     Nitrite, UA pos     Leukocytes, UA small (1+)    POCT UA - MICROSCOPIC ONLY      Result Value Ref Range   WBC, Ur, HPF, POC TNTC     RBC, urine, microscopic TNTC     Bacteria, U Microscopic 4+     Mucus, UA neg     Epithelial cells, urine per micros 0-4     Crystals, Ur, HPF, POC neg     Casts, Ur, LPF, POC neg     Yeast, UA neg    POCT CBC      Result Value Ref Range   WBC 7.9  4.6 - 10.2 K/uL   Lymph, poc 2.0  0.6 - 3.4   POC LYMPH PERCENT 25.0  10 - 50 %L   MID (cbc) 1.0 (*) 0 - 0.9   POC MID % 12.6 (*) 0 - 12 %M   POC Granulocyte 4.9  2 - 6.9   Granulocyte percent 62.4  37 - 80 %G   RBC 5.16  4.69 - 6.13 M/uL   Hemoglobin 13.4 (*) 14.1 - 18.1 g/dL   HCT, POC 16.142.3 (*) 09.643.5 - 53.7 %   MCV 81.9  80 - 97 fL   MCH, POC 26.0 (*) 27 - 31.2 pg   MCHC 31.7 (*) 31.8 - 35.4 g/dL   RDW, POC 04.515.1     Platelet Count, POC 384  142 - 424 K/uL   MPV 7.3  0 - 99.8 fL

## 2013-07-24 NOTE — Patient Instructions (Signed)
Urinary Tract Infection  Urinary tract infections (UTIs) can develop anywhere along your urinary tract. Your urinary tract is your body's drainage system for removing wastes and extra water. Your urinary tract includes two kidneys, two ureters, a bladder, and a urethra. Your kidneys are a pair of bean-shaped organs. Each kidney is about the size of your fist. They are located below your ribs, one on each side of your spine.  CAUSES  Infections are caused by microbes, which are microscopic organisms, including fungi, viruses, and bacteria. These organisms are so small that they can only be seen through a microscope. Bacteria are the microbes that most commonly cause UTIs.  SYMPTOMS   Symptoms of UTIs may vary by age and gender of the patient and by the location of the infection. Symptoms in young women typically include a frequent and intense urge to urinate and a painful, burning feeling in the bladder or urethra during urination. Older women and men are more likely to be tired, shaky, and weak and have muscle aches and abdominal pain. A fever may mean the infection is in your kidneys. Other symptoms of a kidney infection include pain in your back or sides below the ribs, nausea, and vomiting.  DIAGNOSIS  To diagnose a UTI, your caregiver will ask you about your symptoms. Your caregiver also will ask to provide a urine sample. The urine sample will be tested for bacteria and white blood cells. White blood cells are made by your body to help fight infection.  TREATMENT   Typically, UTIs can be treated with medication. Because most UTIs are caused by a bacterial infection, they usually can be treated with the use of antibiotics. The choice of antibiotic and length of treatment depend on your symptoms and the type of bacteria causing your infection.  HOME CARE INSTRUCTIONS   If you were prescribed antibiotics, take them exactly as your caregiver instructs you. Finish the medication even if you feel better after you  have only taken some of the medication.   Drink enough water and fluids to keep your urine clear or pale yellow.   Avoid caffeine, tea, and carbonated beverages. They tend to irritate your bladder.   Empty your bladder often. Avoid holding urine for long periods of time.   Empty your bladder before and after sexual intercourse.   After a bowel movement, women should cleanse from front to back. Use each tissue only once.  SEEK MEDICAL CARE IF:    You have back pain.   You develop a fever.   Your symptoms do not begin to resolve within 3 days.  SEEK IMMEDIATE MEDICAL CARE IF:    You have severe back pain or lower abdominal pain.   You develop chills.   You have nausea or vomiting.   You have continued burning or discomfort with urination.  MAKE SURE YOU:    Understand these instructions.   Will watch your condition.   Will get help right away if you are not doing well or get worse.  Document Released: 01/05/2005 Document Revised: 09/27/2011 Document Reviewed: 05/06/2011  [employer] Patient Information 2014 [employer].

## 2013-07-25 LAB — PSA: PSA: 15.12 ng/mL — AB (ref <=4.00)

## 2013-07-25 NOTE — Addendum Note (Signed)
Addended by: Carmelina Dane on: 07/25/2013 11:21 AM   Modules accepted: Orders

## 2013-07-27 ENCOUNTER — Other Ambulatory Visit: Payer: Self-pay | Admitting: Emergency Medicine

## 2013-08-30 ENCOUNTER — Other Ambulatory Visit: Payer: Self-pay | Admitting: Internal Medicine

## 2013-08-30 LAB — CBC
HCT: 39.6 % (ref 39.0–52.0)
Hemoglobin: 13.7 g/dL (ref 13.0–17.0)
MCH: 26.2 pg (ref 26.0–34.0)
MCHC: 34.6 g/dL (ref 30.0–36.0)
MCV: 75.9 fL — AB (ref 78.0–100.0)
PLATELETS: 276 10*3/uL (ref 150–400)
RBC: 5.22 MIL/uL (ref 4.22–5.81)
RDW: 15.6 % — ABNORMAL HIGH (ref 11.5–15.5)
WBC: 9.4 10*3/uL (ref 4.0–10.5)

## 2013-08-30 LAB — HEMOGLOBIN A1C
Hgb A1c MFr Bld: 6.7 % — ABNORMAL HIGH (ref <5.7)
Mean Plasma Glucose: 146 mg/dL — ABNORMAL HIGH (ref <117)

## 2013-08-31 LAB — LIPID PANEL
Cholesterol: 191 mg/dL (ref 0–200)
HDL: 33 mg/dL — ABNORMAL LOW (ref >39)
LDL Cholesterol: 133 mg/dL — ABNORMAL HIGH (ref 0–99)
Total CHOL/HDL Ratio: 5.8 Ratio
Triglycerides: 123 mg/dL (ref <150)
VLDL: 25 mg/dL (ref 0–40)

## 2013-08-31 LAB — HEPATIC FUNCTION PANEL
ALT: 29 U/L (ref 0–53)
AST: 17 U/L (ref 0–37)
Albumin: 4.2 g/dL (ref 3.5–5.2)
Alkaline Phosphatase: 53 U/L (ref 39–117)
BILIRUBIN DIRECT: 0.1 mg/dL (ref 0.0–0.3)
BILIRUBIN TOTAL: 0.5 mg/dL (ref 0.2–1.2)
Indirect Bilirubin: 0.4 mg/dL (ref 0.2–1.2)
Total Protein: 6.5 g/dL (ref 6.0–8.3)

## 2013-08-31 LAB — URINALYSIS, ROUTINE W REFLEX MICROSCOPIC
BILIRUBIN URINE: NEGATIVE
Glucose, UA: NEGATIVE mg/dL
Hgb urine dipstick: NEGATIVE
Ketones, ur: NEGATIVE mg/dL
Leukocytes, UA: NEGATIVE
NITRITE: NEGATIVE
PH: 7 (ref 5.0–8.0)
Protein, ur: NEGATIVE mg/dL
SPECIFIC GRAVITY, URINE: 1.013 (ref 1.005–1.030)
Urobilinogen, UA: 0.2 mg/dL (ref 0.0–1.0)

## 2013-08-31 LAB — BASIC METABOLIC PANEL
BUN: 14 mg/dL (ref 6–23)
CALCIUM: 9.1 mg/dL (ref 8.4–10.5)
CO2: 26 meq/L (ref 19–32)
Chloride: 105 mEq/L (ref 96–112)
Creat: 1.24 mg/dL (ref 0.50–1.35)
Glucose, Bld: 124 mg/dL — ABNORMAL HIGH (ref 70–99)
Potassium: 4.5 mEq/L (ref 3.5–5.3)
Sodium: 139 mEq/L (ref 135–145)

## 2013-08-31 LAB — TSH: TSH: 2.539 u[IU]/mL (ref 0.350–4.500)

## 2013-08-31 LAB — PSA: PSA: 2.29 ng/mL (ref <=4.00)

## 2013-09-06 ENCOUNTER — Encounter: Payer: Self-pay | Admitting: Internal Medicine

## 2013-09-06 ENCOUNTER — Ambulatory Visit (INDEPENDENT_AMBULATORY_CARE_PROVIDER_SITE_OTHER): Payer: BC Managed Care – PPO | Admitting: Internal Medicine

## 2013-09-06 VITALS — BP 120/88 | HR 80 | Temp 98.5°F | Resp 16 | Wt 315.0 lb

## 2013-09-06 DIAGNOSIS — N390 Urinary tract infection, site not specified: Secondary | ICD-10-CM

## 2013-09-06 DIAGNOSIS — R972 Elevated prostate specific antigen [PSA]: Secondary | ICD-10-CM

## 2013-09-06 DIAGNOSIS — E669 Obesity, unspecified: Secondary | ICD-10-CM

## 2013-09-06 DIAGNOSIS — E119 Type 2 diabetes mellitus without complications: Secondary | ICD-10-CM

## 2013-09-06 HISTORY — DX: Urinary tract infection, site not specified: N39.0

## 2013-09-06 HISTORY — DX: Elevated prostate specific antigen (PSA): R97.20

## 2013-09-06 MED ORDER — BENAZEPRIL HCL 40 MG PO TABS: 40.0000 mg | ORAL_TABLET | Freq: Every day | ORAL | Status: DC

## 2013-09-06 MED ORDER — OMEPRAZOLE 20 MG PO CPDR: 20.0000 mg | DELAYED_RELEASE_CAPSULE | Freq: Every day | ORAL | Status: DC

## 2013-09-06 MED ORDER — DAPAGLIFLOZIN PROPANEDIOL 10 MG PO TABS: 10.0000 mg | ORAL_TABLET | Freq: Every day | ORAL | Status: DC

## 2013-09-06 MED ORDER — LINAGLIPTIN-METFORMIN HCL 2.5-1000 MG PO TABS: ORAL_TABLET | ORAL | Status: DC

## 2013-09-06 NOTE — Patient Instructions (Signed)
Lab Results  Component Value Date   PSA 2.29 08/30/2013   PSA 15.12* 07/24/2013   PSA 1.01 08/22/2008    

## 2013-09-06 NOTE — Assessment & Plan Note (Signed)
On Doxy

## 2013-09-06 NOTE — Assessment & Plan Note (Signed)
Lab Results  Component Value Date   PSA 2.29 08/30/2013   PSA 15.12* 07/24/2013   PSA 1.01 08/22/2008

## 2013-09-06 NOTE — Assessment & Plan Note (Signed)
Wt Readings from Last 3 Encounters:  09/06/13 315 lb (142.883 kg)  07/24/13 308 lb (139.708 kg)  05/10/13 313 lb (141.976 kg)

## 2013-09-06 NOTE — Progress Notes (Signed)
   Subjective:    HPI  The patient presents for a follow-up of  chronic hypertension, obesity, chronic dyslipidemia, type 2 diabetes controlled with medicines He had labs at [facility] recently. He had a UTI and elev PSA - Dr Annabell Howells   BP Readings from Last 3 Encounters:  09/06/13 120/88  07/24/13 160/80  05/10/13 150/92   Wt Readings from Last 3 Encounters:  09/06/13 315 lb (142.883 kg)  07/24/13 308 lb (139.708 kg)  05/10/13 313 lb (141.976 kg)     Review of Systems  Constitutional: Negative for appetite change, fatigue and unexpected weight change.  HENT: Negative for congestion, nosebleeds, sneezing, sore throat and trouble swallowing.   Eyes: Negative for itching and visual disturbance.  Respiratory: Negative for cough.   Cardiovascular: Negative for chest pain, palpitations and leg swelling.  Gastrointestinal: Negative for nausea, diarrhea, blood in stool and abdominal distention.  Genitourinary: Negative for frequency and hematuria.  Musculoskeletal: Negative for back pain, gait problem, joint swelling and neck pain.  Skin: Negative for rash.  Neurological: Negative for dizziness, tremors, speech difficulty and weakness.  Psychiatric/Behavioral: Negative for sleep disturbance, dysphoric mood and agitation. The patient is not nervous/anxious.        Objective:   Physical Exam  Constitutional: He is oriented to person, place, and time. He appears well-developed.  obese  HENT:  Mouth/Throat: Oropharynx is clear and moist.  Eyes: Conjunctivae are normal. Pupils are equal, round, and reactive to light.  Neck: Normal range of motion. No JVD present. No thyromegaly present.  Cardiovascular: Normal rate, regular rhythm, normal heart sounds and intact distal pulses.  Exam reveals no gallop and no friction rub.   No murmur heard. Pulmonary/Chest: Effort normal and breath sounds normal. No respiratory distress. He has no wheezes. He has no rales. He exhibits no tenderness.   Abdominal: Soft. Bowel sounds are normal. He exhibits no distension and no mass. There is no tenderness. There is no rebound and no guarding.  Musculoskeletal: Normal range of motion. He exhibits edema (trace B). He exhibits no tenderness.  Lymphadenopathy:    He has no cervical adenopathy.  Neurological: He is alert and oriented to person, place, and time. He has normal reflexes. No cranial nerve deficit. He exhibits normal muscle tone. Coordination normal.  Skin: Skin is warm and dry. No rash noted.  R shin w/hyperpigm  Psychiatric: He has a normal mood and affect. His behavior is normal. Judgment and thought content normal.  trace edema   Labs from [facility]      Assessment & Plan:

## 2013-09-06 NOTE — Progress Notes (Signed)
Pre visit review using our clinic review tool, if applicable. No additional management support is needed unless otherwise documented below in the visit note. 

## 2013-09-06 NOTE — Assessment & Plan Note (Signed)
Continue with current prescription therapy as reflected on the Med list.  

## 2013-12-27 ENCOUNTER — Other Ambulatory Visit: Payer: Self-pay | Admitting: Internal Medicine

## 2013-12-27 LAB — HEMOGLOBIN A1C
Hgb A1c MFr Bld: 6.8 % — ABNORMAL HIGH (ref <5.7)
MEAN PLASMA GLUCOSE: 148 mg/dL — AB (ref <117)

## 2013-12-27 LAB — BASIC METABOLIC PANEL
BUN: 20 mg/dL (ref 6–23)
CHLORIDE: 106 meq/L (ref 96–112)
CO2: 27 meq/L (ref 19–32)
CREATININE: 0.98 mg/dL (ref 0.50–1.35)
Calcium: 9.1 mg/dL (ref 8.4–10.5)
GLUCOSE: 106 mg/dL — AB (ref 70–99)
POTASSIUM: 4.4 meq/L (ref 3.5–5.3)
Sodium: 141 mEq/L (ref 135–145)

## 2013-12-28 LAB — URINALYSIS, ROUTINE W REFLEX MICROSCOPIC
BILIRUBIN URINE: NEGATIVE
GLUCOSE, UA: NEGATIVE mg/dL
HGB URINE DIPSTICK: NEGATIVE
KETONES UR: NEGATIVE mg/dL
Leukocytes, UA: NEGATIVE
NITRITE: NEGATIVE
Protein, ur: NEGATIVE mg/dL
SPECIFIC GRAVITY, URINE: 1.025 (ref 1.005–1.030)
Urobilinogen, UA: 0.2 mg/dL (ref 0.0–1.0)
pH: 6 (ref 5.0–8.0)

## 2014-01-03 ENCOUNTER — Encounter: Payer: Self-pay | Admitting: Internal Medicine

## 2014-01-03 ENCOUNTER — Ambulatory Visit (INDEPENDENT_AMBULATORY_CARE_PROVIDER_SITE_OTHER): Payer: BC Managed Care – PPO | Admitting: Internal Medicine

## 2014-01-03 VITALS — BP 140/92 | HR 80 | Temp 98.3°F | Resp 16 | Wt 315.0 lb

## 2014-01-03 DIAGNOSIS — I872 Venous insufficiency (chronic) (peripheral): Secondary | ICD-10-CM

## 2014-01-03 DIAGNOSIS — E119 Type 2 diabetes mellitus without complications: Secondary | ICD-10-CM

## 2014-01-03 DIAGNOSIS — I10 Essential (primary) hypertension: Secondary | ICD-10-CM

## 2014-01-03 MED ORDER — TRIAMCINOLONE ACETONIDE 0.5 % EX CREA: 1.0000 "application " | TOPICAL_CREAM | Freq: Two times a day (BID) | CUTANEOUS | Status: DC

## 2014-01-03 NOTE — Progress Notes (Signed)
Patient ID: Andrew Carson, male   DOB: [birth year], 57 y.o.   MRN: [id number]   Subjective:    HPI  The patient presents for a follow-up of  chronic hypertension, obesity, chronic dyslipidemia, type 2 diabetes controlled with medicines  Wife broke her L leg in summer of 2015, had a metal plate put in.... He had labs at [facility] recently. He had a UTI and elev PSA - Dr Annabell Howells   BP Readings from Last 3 Encounters:  01/03/14 140/92  09/06/13 120/88  07/24/13 160/80   Wt Readings from Last 3 Encounters:  01/03/14 315 lb (142.883 kg)  09/06/13 315 lb (142.883 kg)  07/24/13 308 lb (139.708 kg)     Review of Systems  Constitutional: Negative for appetite change, fatigue and unexpected weight change.  HENT: Negative for congestion, nosebleeds, sneezing, sore throat and trouble swallowing.   Eyes: Negative for itching and visual disturbance.  Respiratory: Negative for cough.   Cardiovascular: Negative for chest pain, palpitations and leg swelling.  Gastrointestinal: Negative for nausea, diarrhea, blood in stool and abdominal distention.  Genitourinary: Negative for frequency and hematuria.  Musculoskeletal: Negative for back pain, gait problem, joint swelling and neck pain.  Skin: Negative for rash.  Neurological: Negative for dizziness, tremors, speech difficulty and weakness.  Psychiatric/Behavioral: Negative for sleep disturbance, dysphoric mood and agitation. The patient is not nervous/anxious.        Objective:   Physical Exam  Constitutional: He is oriented to person, place, and time. He appears well-developed.  obese  HENT:  Mouth/Throat: Oropharynx is clear and moist.  Eyes: Conjunctivae are normal. Pupils are equal, round, and reactive to light.  Neck: Normal range of motion. No JVD present. No thyromegaly present.  Cardiovascular: Normal rate, regular rhythm, normal heart sounds and intact distal pulses.  Exam reveals no gallop and no friction rub.   No murmur  heard. Pulmonary/Chest: Effort normal and breath sounds normal. No respiratory distress. He has no wheezes. He has no rales. He exhibits no tenderness.  Abdominal: Soft. Bowel sounds are normal. He exhibits no distension and no mass. There is no tenderness. There is no rebound and no guarding.  Musculoskeletal: Normal range of motion. He exhibits edema (trace B). He exhibits no tenderness.  Lymphadenopathy:    He has no cervical adenopathy.  Neurological: He is alert and oriented to person, place, and time. He has normal reflexes. No cranial nerve deficit. He exhibits normal muscle tone. Coordination normal.  Skin: Skin is warm and dry. No rash noted.  R shin w/hyperpigm  Psychiatric: He has a normal mood and affect. His behavior is normal. Judgment and thought content normal.  trace edema Hyperpigmented R shin - scaly now   Labs from [facility]      Assessment & Plan:

## 2014-01-03 NOTE — Assessment & Plan Note (Signed)
Continue with current prescription therapy as reflected on the Med list.  

## 2014-01-03 NOTE — Assessment & Plan Note (Signed)
Stasis dermatitis - Rx Triamcinolone

## 2014-01-03 NOTE — Progress Notes (Signed)
Pre visit review using our clinic review tool, if applicable. No additional management support is needed unless otherwise documented below in the visit note. 

## 2014-01-03 NOTE — Assessment & Plan Note (Signed)
Continue with current prescription therapy as reflected on the Med list. NAS diet 

## 2014-01-24 ENCOUNTER — Other Ambulatory Visit: Payer: Self-pay

## 2014-04-06 ENCOUNTER — Other Ambulatory Visit: Payer: Self-pay | Admitting: Internal Medicine

## 2014-05-07 ENCOUNTER — Other Ambulatory Visit: Payer: Self-pay | Admitting: Internal Medicine

## 2014-05-07 LAB — COMPREHENSIVE METABOLIC PANEL
ALK PHOS: 48 U/L (ref 39–117)
ALT: 34 U/L (ref 0–53)
AST: 20 U/L (ref 0–37)
Albumin: 3.8 g/dL (ref 3.5–5.2)
BILIRUBIN TOTAL: 0.6 mg/dL (ref 0.2–1.2)
BUN: 26 mg/dL — AB (ref 6–23)
CHLORIDE: 107 meq/L (ref 96–112)
CO2: 24 mEq/L (ref 19–32)
Calcium: 9.3 mg/dL (ref 8.4–10.5)
Creat: 0.95 mg/dL (ref 0.50–1.35)
Glucose, Bld: 124 mg/dL — ABNORMAL HIGH (ref 70–99)
POTASSIUM: 4.3 meq/L (ref 3.5–5.3)
Sodium: 140 mEq/L (ref 135–145)
TOTAL PROTEIN: 6.2 g/dL (ref 6.0–8.3)

## 2014-05-07 LAB — CBC
HCT: 39.1 % (ref 39.0–52.0)
Hemoglobin: 12.7 g/dL — ABNORMAL LOW (ref 13.0–17.0)
MCH: 25.5 pg — AB (ref 26.0–34.0)
MCHC: 32.5 g/dL (ref 30.0–36.0)
MCV: 78.4 fL (ref 78.0–100.0)
MPV: 8.4 fL — AB (ref 8.6–12.4)
Platelets: 250 10*3/uL (ref 150–400)
RBC: 4.99 MIL/uL (ref 4.22–5.81)
RDW: 15.3 % (ref 11.5–15.5)
WBC: 7.1 10*3/uL (ref 4.0–10.5)

## 2014-05-07 LAB — LIPID PANEL
Cholesterol: 183 mg/dL (ref 0–200)
HDL: 35 mg/dL — ABNORMAL LOW (ref >39)
LDL Cholesterol: 124 mg/dL — ABNORMAL HIGH (ref 0–99)
Total CHOL/HDL Ratio: 5.2 Ratio
Triglycerides: 120 mg/dL (ref <150)
VLDL: 24 mg/dL (ref 0–40)

## 2014-05-07 LAB — HEMOGLOBIN A1C
HEMOGLOBIN A1C: 6.9 % — AB (ref <5.7)
Mean Plasma Glucose: 151 mg/dL — ABNORMAL HIGH (ref <117)

## 2014-05-07 LAB — TSH: TSH: 2.404 u[IU]/mL (ref 0.350–4.500)

## 2014-05-08 LAB — URINALYSIS, ROUTINE W REFLEX MICROSCOPIC
BILIRUBIN URINE: NEGATIVE
Glucose, UA: NEGATIVE mg/dL
HGB URINE DIPSTICK: NEGATIVE
Ketones, ur: NEGATIVE mg/dL
Leukocytes, UA: NEGATIVE
Nitrite: NEGATIVE
Protein, ur: NEGATIVE mg/dL
SPECIFIC GRAVITY, URINE: 1.021 (ref 1.005–1.030)
Urobilinogen, UA: 0.2 mg/dL (ref 0.0–1.0)
pH: 5.5 (ref 5.0–8.0)

## 2014-05-09 ENCOUNTER — Ambulatory Visit (INDEPENDENT_AMBULATORY_CARE_PROVIDER_SITE_OTHER): Payer: BLUE CROSS/BLUE SHIELD | Admitting: Internal Medicine

## 2014-05-09 ENCOUNTER — Encounter: Payer: Self-pay | Admitting: Internal Medicine

## 2014-05-09 VITALS — BP 160/90 | HR 73 | Temp 98.7°F | Ht 71.0 in | Wt 327.0 lb

## 2014-05-09 DIAGNOSIS — Z0000 Encounter for general adult medical examination without abnormal findings: Secondary | ICD-10-CM | POA: Diagnosis not present

## 2014-05-09 DIAGNOSIS — E1169 Type 2 diabetes mellitus with other specified complication: Secondary | ICD-10-CM

## 2014-05-09 DIAGNOSIS — E118 Type 2 diabetes mellitus with unspecified complications: Secondary | ICD-10-CM

## 2014-05-09 DIAGNOSIS — I10 Essential (primary) hypertension: Secondary | ICD-10-CM | POA: Diagnosis not present

## 2014-05-09 DIAGNOSIS — R972 Elevated prostate specific antigen [PSA]: Secondary | ICD-10-CM

## 2014-05-09 DIAGNOSIS — Z23 Encounter for immunization: Secondary | ICD-10-CM | POA: Diagnosis not present

## 2014-05-09 MED ORDER — BENAZEPRIL-HYDROCHLOROTHIAZIDE 20-12.5 MG PO TABS: 2.0000 | ORAL_TABLET | Freq: Every day | ORAL | Status: DC

## 2014-05-09 NOTE — Assessment & Plan Note (Signed)
See meds Wt loss discussed

## 2014-05-09 NOTE — Assessment & Plan Note (Signed)
F/u w/Dr Annabell Howells  Free PSA

## 2014-05-09 NOTE — Progress Notes (Signed)
Subjective:    HPI  The patient is here for a wellness exam. The patient has been doing well overall without major physical or psychological issues going on lately.  The patient presents for a follow-up of  chronic hypertension, obesity, chronic dyslipidemia, type 2 diabetes controlled with medicines  Wife broke her L leg in summer of 2015, had a metal plate put in....  He had labs at [facility] recently. He had a UTI and elev PSA - Dr Annabell Howells   BP Readings from Last 3 Encounters:  05/09/14 160/90  01/03/14 140/92  09/06/13 120/88   Wt Readings from Last 3 Encounters:  05/09/14 327 lb (148.326 kg)  01/03/14 315 lb (142.883 kg)  09/06/13 315 lb (142.883 kg)     Review of Systems  Constitutional: Negative for appetite change, fatigue and unexpected weight change.  HENT: Negative for congestion, nosebleeds, sneezing, sore throat and trouble swallowing.   Eyes: Negative for itching and visual disturbance.  Respiratory: Negative for cough.   Cardiovascular: Negative for chest pain, palpitations and leg swelling.  Gastrointestinal: Negative for nausea, diarrhea, blood in stool and abdominal distention.  Genitourinary: Negative for frequency and hematuria.  Musculoskeletal: Negative for back pain, gait problem, joint swelling and neck pain.  Skin: Negative for rash.  Neurological: Negative for dizziness, tremors, speech difficulty and weakness.  Psychiatric/Behavioral: Negative for sleep disturbance, dysphoric mood and agitation. The patient is not nervous/anxious.        Objective:   Physical Exam  Constitutional: He is oriented to person, place, and time. He appears well-developed.  obese  HENT:  Mouth/Throat: Oropharynx is clear and moist.  Eyes: Conjunctivae are normal. Pupils are equal, round, and reactive to light.  Neck: Normal range of motion. No JVD present. No thyromegaly present.  Cardiovascular: Normal rate, regular rhythm, normal heart sounds and intact distal  pulses.  Exam reveals no gallop and no friction rub.   No murmur heard. Pulmonary/Chest: Effort normal and breath sounds normal. No respiratory distress. He has no wheezes. He has no rales. He exhibits no tenderness.  Abdominal: Soft. Bowel sounds are normal. He exhibits no distension and no mass. There is no tenderness. There is no rebound and no guarding.  Musculoskeletal: Normal range of motion. He exhibits edema (trace B). He exhibits no tenderness.  Lymphadenopathy:    He has no cervical adenopathy.  Neurological: He is alert and oriented to person, place, and time. He has normal reflexes. No cranial nerve deficit. He exhibits normal muscle tone. Coordination normal.  Skin: Skin is warm and dry. No rash noted.  R shin w/hyperpigm  Psychiatric: He has a normal mood and affect. His behavior is normal. Judgment and thought content normal.  trace edema Hyperpigmented R shin - scaly now Rectal WNL G(-)   Lab Results  Component Value Date   WBC 7.1 05/07/2014   HGB 12.7* 05/07/2014   HCT 39.1 05/07/2014   PLT 250 05/07/2014   GLUCOSE 124* 05/07/2014   CHOL 183 05/07/2014   TRIG 120 05/07/2014   HDL 35* 05/07/2014   LDLDIRECT 149.9 03/12/2007   LDLCALC 124* 05/07/2014   ALT 34 05/07/2014   AST 20 05/07/2014   NA 140 05/07/2014   K 4.3 05/07/2014   CL 107 05/07/2014   CREATININE 0.95 05/07/2014   BUN 26* 05/07/2014   CO2 24 05/07/2014   TSH 2.404 05/07/2014   PSA 2.29 08/30/2013   HGBA1C 6.9* 05/07/2014       Assessment & Plan:

## 2014-05-09 NOTE — Assessment & Plan Note (Signed)
Continue with current prescription therapy as reflected on the Med list.  

## 2014-05-09 NOTE — Assessment & Plan Note (Signed)
We discussed age appropriate health related issues, including available/recomended screening tests and vaccinations. We discussed a need for adhering to healthy diet and exercise. Labs/EKG were reviewed/ordered. All questions were answered. Low carb diet  

## 2014-05-09 NOTE — Patient Instructions (Signed)
Low carb diet 

## 2014-05-12 ENCOUNTER — Telehealth: Payer: Self-pay | Admitting: Internal Medicine

## 2014-05-12 NOTE — Telephone Encounter (Signed)
emmi emailed °

## 2014-07-18 ENCOUNTER — Telehealth: Payer: Self-pay | Admitting: Internal Medicine

## 2014-07-18 MED ORDER — VITAMIN D3 25 MCG (1000 UNIT) PO TABS: 1000.0000 [IU] | ORAL_TABLET | Freq: Every day | ORAL | Status: DC

## 2014-07-18 MED ORDER — FISH OIL 1000 MG PO CAPS: 1.0000 | ORAL_CAPSULE | Freq: Two times a day (BID) | ORAL | Status: DC

## 2014-07-18 NOTE — Telephone Encounter (Signed)
Patient needs refills for Omega-3 Fatty Acids (FISH OIL) 1000 MG CAPS [[id number]] and Cholecalciferol (VITAMIN D3) 1000 UNITS tablet [[id number]]. Pharmacy is [facility].

## 2014-07-18 NOTE — Telephone Encounter (Signed)
Sent refills to [facility].../lmb

## 2014-07-18 NOTE — Telephone Encounter (Signed)
Both prescriptions need 90 day for 1 year

## 2014-09-01 ENCOUNTER — Other Ambulatory Visit: Payer: Self-pay | Admitting: *Deleted

## 2014-09-01 MED ORDER — LINAGLIPTIN-METFORMIN HCL 2.5-1000 MG PO TABS: ORAL_TABLET | ORAL | Status: DC

## 2014-09-12 ENCOUNTER — Other Ambulatory Visit: Payer: Self-pay | Admitting: Internal Medicine

## 2014-09-12 ENCOUNTER — Encounter: Payer: Self-pay | Admitting: Internal Medicine

## 2014-09-12 ENCOUNTER — Telehealth: Payer: Self-pay | Admitting: Internal Medicine

## 2014-09-12 ENCOUNTER — Ambulatory Visit (INDEPENDENT_AMBULATORY_CARE_PROVIDER_SITE_OTHER): Payer: BLUE CROSS/BLUE SHIELD | Admitting: Internal Medicine

## 2014-09-12 VITALS — BP 130/82 | HR 96 | Wt 325.0 lb

## 2014-09-12 DIAGNOSIS — E119 Type 2 diabetes mellitus without complications: Secondary | ICD-10-CM

## 2014-09-12 DIAGNOSIS — E669 Obesity, unspecified: Secondary | ICD-10-CM

## 2014-09-12 DIAGNOSIS — R972 Elevated prostate specific antigen [PSA]: Secondary | ICD-10-CM

## 2014-09-12 DIAGNOSIS — I10 Essential (primary) hypertension: Secondary | ICD-10-CM

## 2014-09-12 DIAGNOSIS — E559 Vitamin D deficiency, unspecified: Secondary | ICD-10-CM

## 2014-09-12 DIAGNOSIS — E785 Hyperlipidemia, unspecified: Secondary | ICD-10-CM

## 2014-09-12 LAB — HEMOGLOBIN A1C
Hgb A1c MFr Bld: 7.3 % — ABNORMAL HIGH (ref <5.7)
Mean Plasma Glucose: 163 mg/dL — ABNORMAL HIGH (ref <117)

## 2014-09-12 LAB — BASIC METABOLIC PANEL
BUN: 17 mg/dL (ref 6–23)
CALCIUM: 9 mg/dL (ref 8.4–10.5)
CHLORIDE: 102 meq/L (ref 96–112)
CO2: 28 mEq/L (ref 19–32)
Creat: 1.05 mg/dL (ref 0.50–1.35)
GLUCOSE: 155 mg/dL — AB (ref 70–99)
POTASSIUM: 4.9 meq/L (ref 3.5–5.3)
Sodium: 139 mEq/L (ref 135–145)

## 2014-09-12 NOTE — Assessment & Plan Note (Signed)
Labs

## 2014-09-12 NOTE — Assessment & Plan Note (Signed)
On Vit D 

## 2014-09-12 NOTE — Telephone Encounter (Signed)
OK CBC, CMET, Lipids, A1c Thx

## 2014-09-12 NOTE — Assessment & Plan Note (Signed)
Chronic Lotensin HCT

## 2014-09-12 NOTE — Telephone Encounter (Signed)
Generated orders notifed pt ready for pick-up...[name]

## 2014-09-12 NOTE — Progress Notes (Signed)
Patient ID: Andrew Carson, male   DOB: 1955-09-18, 59 y.o.   MRN: [id number]   Subjective:    HPI  The patient presents for a follow-up of  chronic hypertension, obesity, chronic dyslipidemia, type 2 diabetes controlled with medicines. 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 He had labs at [facility] recently. He had a UTI and elev PSA - Dr Annabell Howells   BP Readings from Last 3 Encounters:  09/12/14 130/82  05/09/14 160/90  01/03/14 140/92   Wt Readings from Last 3 Encounters:  09/12/14 325 lb (147.419 kg)  05/09/14 327 lb (148.326 kg)  01/03/14 315 lb (142.883 kg)     Review of Systems  Constitutional: Negative for appetite change, fatigue and unexpected weight change.  HENT: Negative for congestion, nosebleeds, sneezing, sore throat and trouble swallowing.   Eyes: Negative for itching and visual disturbance.  Respiratory: Negative for cough.   Cardiovascular: Negative for chest pain, palpitations and leg swelling.  Gastrointestinal: Negative for nausea, diarrhea, blood in stool and abdominal distention.  Genitourinary: Negative for frequency and hematuria.  Musculoskeletal: Negative for back pain, joint swelling, gait problem and neck pain.  Skin: Negative for rash.  Neurological: Negative for dizziness, tremors, speech difficulty and weakness.  Psychiatric/Behavioral: Negative for sleep disturbance, dysphoric mood and agitation. The patient is not nervous/anxious.        Objective:   Physical Exam  Constitutional: He is oriented to person, place, and time. He appears well-developed.  obese  HENT:  Mouth/Throat: Oropharynx is clear and moist.  Eyes: Conjunctivae are normal. Pupils are equal, round, and reactive to light.  Neck: Normal range of motion. No JVD present. No thyromegaly present.  Cardiovascular: Normal rate, regular rhythm, normal heart sounds and intact distal pulses.  Exam reveals no gallop  and no friction rub.   No murmur heard. Pulmonary/Chest: Effort normal and breath sounds normal. No respiratory distress. He has no wheezes. He has no rales. He exhibits no tenderness.  Abdominal: Soft. Bowel sounds are normal. He exhibits no distension and no mass. There is no tenderness. There is no rebound and no guarding.  Musculoskeletal: Normal range of motion. He exhibits edema (trace B). He exhibits no tenderness.  Lymphadenopathy:    He has no cervical adenopathy.  Neurological: He is alert and oriented to person, place, and time. He has normal reflexes. No cranial nerve deficit. He exhibits normal muscle tone. Coordination normal.  Skin: Skin is warm and dry. No rash noted.  R shin w/hyperpigm  Psychiatric: He has a normal mood and affect. His behavior is normal. Judgment and thought content normal.  trace edema Hyperpigmented R shin - scaly now   Labs from [facility]      Assessment & Plan:

## 2014-09-12 NOTE — Progress Notes (Signed)
Pre visit review using our clinic review tool, if applicable. No additional management support is needed unless otherwise documented below in the visit note. 

## 2014-09-12 NOTE — Assessment & Plan Note (Signed)
Linagl-metformin Labs

## 2014-09-12 NOTE — Telephone Encounter (Signed)
Patient forgot to request a written order for lab work. He gets his lab work done @ [facility]

## 2014-09-12 NOTE — Assessment & Plan Note (Signed)
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

## 2014-09-13 LAB — PSA, TOTAL AND FREE
PSA, Free Pct: 26 % (ref >25)
PSA, Free: 0.27 ng/mL
PSA: 1.03 ng/mL (ref <=4.00)

## 2014-09-17 ENCOUNTER — Telehealth: Payer: Self-pay | Admitting: Internal Medicine

## 2014-09-17 DIAGNOSIS — R400 Somnolence: Secondary | ICD-10-CM

## 2014-09-17 DIAGNOSIS — R0683 Snoring: Secondary | ICD-10-CM

## 2014-09-17 NOTE — Telephone Encounter (Signed)
Can we work in sooner?

## 2014-09-17 NOTE — Telephone Encounter (Signed)
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  Is there anywhere else we can refer him too?

## 2014-09-18 NOTE — Telephone Encounter (Signed)
Pls move up appt ASAP - needs for work. Thx! Thank you!

## 2014-09-18 NOTE — Telephone Encounter (Signed)
I'll try to sch a sleep test. AP

## 2014-09-18 NOTE — Telephone Encounter (Signed)
Called pt to make sooner appt, but pt is actually needing to have a sleep apnea test done. Pulmonary can not see him until middle of august. Pt is wanting to know can md refer him to see someone sooner to have the test done...[name]

## 2014-10-06 ENCOUNTER — Other Ambulatory Visit: Payer: Self-pay

## 2014-11-30 ENCOUNTER — Other Ambulatory Visit: Payer: Self-pay | Admitting: Internal Medicine

## 2014-12-25 ENCOUNTER — Other Ambulatory Visit: Payer: Self-pay | Admitting: Internal Medicine

## 2015-01-16 ENCOUNTER — Encounter: Payer: Self-pay | Admitting: Internal Medicine

## 2015-01-16 ENCOUNTER — Other Ambulatory Visit: Payer: Self-pay | Admitting: Internal Medicine

## 2015-01-16 ENCOUNTER — Ambulatory Visit (INDEPENDENT_AMBULATORY_CARE_PROVIDER_SITE_OTHER): Payer: BLUE CROSS/BLUE SHIELD | Admitting: Internal Medicine

## 2015-01-16 VITALS — BP 110/78 | HR 88 | Wt 318.0 lb

## 2015-01-16 DIAGNOSIS — I872 Venous insufficiency (chronic) (peripheral): Secondary | ICD-10-CM | POA: Diagnosis not present

## 2015-01-16 DIAGNOSIS — E119 Type 2 diabetes mellitus without complications: Secondary | ICD-10-CM | POA: Diagnosis not present

## 2015-01-16 DIAGNOSIS — R972 Elevated prostate specific antigen [PSA]: Secondary | ICD-10-CM

## 2015-01-16 DIAGNOSIS — I10 Essential (primary) hypertension: Secondary | ICD-10-CM

## 2015-01-16 LAB — BASIC METABOLIC PANEL
BUN: 21 mg/dL (ref 7–25)
CALCIUM: 9.3 mg/dL (ref 8.6–10.3)
CO2: 28 mmol/L (ref 20–31)
Chloride: 101 mmol/L (ref 98–110)
Creat: 1.12 mg/dL (ref 0.70–1.33)
Glucose, Bld: 147 mg/dL — ABNORMAL HIGH (ref 65–99)
Potassium: 4.6 mmol/L (ref 3.5–5.3)
SODIUM: 139 mmol/L (ref 135–146)

## 2015-01-16 LAB — CBC
HCT: 40.3 % (ref 39.0–52.0)
Hemoglobin: 13.4 g/dL (ref 13.0–17.0)
MCH: 25.3 pg — AB (ref 26.0–34.0)
MCHC: 33.3 g/dL (ref 30.0–36.0)
MCV: 76 fL — AB (ref 78.0–100.0)
MPV: 8.8 fL (ref 8.6–12.4)
PLATELETS: 244 10*3/uL (ref 150–400)
RBC: 5.3 MIL/uL (ref 4.22–5.81)
RDW: 14.8 % (ref 11.5–15.5)
WBC: 8.1 10*3/uL (ref 4.0–10.5)

## 2015-01-16 LAB — LIPID PANEL
CHOLESTEROL: 208 mg/dL — AB (ref 125–200)
HDL: 28 mg/dL — AB (ref >=40)
LDL Cholesterol: 144 mg/dL — ABNORMAL HIGH (ref <130)
Total CHOL/HDL Ratio: 7.4 Ratio — ABNORMAL HIGH (ref <=5.0)
Triglycerides: 178 mg/dL — ABNORMAL HIGH (ref <150)
VLDL: 36 mg/dL — ABNORMAL HIGH (ref <30)

## 2015-01-16 LAB — HEPATIC FUNCTION PANEL
ALT: 43 U/L (ref 9–46)
AST: 20 U/L (ref 10–35)
Albumin: 4.2 g/dL (ref 3.6–5.1)
Alkaline Phosphatase: 50 U/L (ref 40–115)
BILIRUBIN DIRECT: 0.1 mg/dL (ref <=0.2)
Indirect Bilirubin: 0.4 mg/dL (ref 0.2–1.2)
TOTAL PROTEIN: 6.8 g/dL (ref 6.1–8.1)
Total Bilirubin: 0.5 mg/dL (ref 0.2–1.2)

## 2015-01-16 LAB — HEMOGLOBIN A1C
HEMOGLOBIN A1C: 7.6 % — AB (ref <5.7)
MEAN PLASMA GLUCOSE: 171 mg/dL — AB (ref <117)

## 2015-01-16 MED ORDER — MOMETASONE FUROATE 0.1 % EX CREA: 1.0000 "application " | TOPICAL_CREAM | Freq: Every day | CUTANEOUS | Status: DC

## 2015-01-16 MED ORDER — VASCULERA PO TABS: 1.0000 | ORAL_TABLET | Freq: Two times a day (BID) | ORAL | Status: DC

## 2015-01-16 NOTE — Assessment & Plan Note (Signed)
Labs Lotensin HCT

## 2015-01-16 NOTE — Assessment & Plan Note (Signed)
Labs Cont w/wt loss 

## 2015-01-16 NOTE — Progress Notes (Signed)
Subjective:  Patient ID: Andrew Carson, male    DOB: Aug 24, 1955  Age: 59 y.o. MRN: [id number]  CC: No chief complaint on file.   HPI Andrew Carson presents for GERD, DM,  venus stasis dermatitis, HTN f/u. P had rash from the cream  Outpatient Prescriptions Prior to Visit  Medication Sig Dispense Refill  . benazepril-hydrochlorthiazide (LOTENSIN HCT) 20-12.5 MG per tablet Take 2 tablets by mouth daily. 180 tablet 11  . glucose blood test strip 1 each by Other route 2 (two) times daily. Use as instructed     . Linagliptin-Metformin HCl (JENTADUETO) 2.08-998 MG TABS TAKE 1 TABLET BY MOUTH 2 (TWO) TIMES DAILY. 180 tablet 3  . naproxen (NAPROSYN) 500 MG tablet TAKE 1 TABLET BY MOUTH TWICE A DAY WITH A MEAL 180 tablet 2  . omeprazole (PRILOSEC) 20 MG capsule TAKE 1 CAPSULE (20 MG TOTAL) BY MOUTH DAILY. 90 capsule 3  . tamsulosin (FLOMAX) 0.4 MG CAPS capsule Take 0.4 mg by mouth daily.  3  . cholecalciferol (VITAMIN D) 1000 UNITS tablet Take 1 tablet (1,000 Units total) by mouth daily. 90 tablet 3  . Omega-3 Fatty Acids (FISH OIL) 1000 MG CAPS Take 1 capsule (1,000 mg total) by mouth 2 (two) times daily. 180 capsule 3  . aspirin EC 81 MG tablet Take 81 mg by mouth daily.    Marland Kitchen triamcinolone cream (KENALOG) 0.5 % Apply 1 application topically 2 (two) times daily. (Patient not taking: Reported on 01/16/2015) 90 g 3   No facility-administered medications prior to visit.    ROS Review of Systems  Constitutional: Negative for appetite change, fatigue and unexpected weight change.  HENT: Negative for congestion, nosebleeds, sneezing, sore throat and trouble swallowing.   Eyes: Negative for itching and visual disturbance.  Respiratory: Negative for cough.   Cardiovascular: Negative for chest pain, palpitations and leg swelling.  Gastrointestinal: Negative for nausea, diarrhea, blood in stool and abdominal distention.  Genitourinary: Negative for frequency and hematuria.  Musculoskeletal:  Negative for back pain, joint swelling, gait problem and neck pain.  Skin: Positive for rash. Negative for wound.  Neurological: Negative for dizziness, tremors, speech difficulty and weakness.  Psychiatric/Behavioral: Negative for sleep disturbance, dysphoric mood and agitation. The patient is not nervous/anxious.     Objective:  BP 110/78 mmHg  Pulse 88  Wt 318 lb (144.244 kg)  SpO2 94%  BP Readings from Last 3 Encounters:  01/16/15 110/78  09/12/14 130/82  05/09/14 160/90    Wt Readings from Last 3 Encounters:  01/16/15 318 lb (144.244 kg)  09/12/14 325 lb (147.419 kg)  05/09/14 327 lb (148.326 kg)    Physical Exam  Constitutional: He is oriented to person, place, and time. He appears well-developed. No distress.  NAD  HENT:  Mouth/Throat: Oropharynx is clear and moist.  Eyes: Conjunctivae are normal. Pupils are equal, round, and reactive to light.  Neck: Normal range of motion. No JVD present. No thyromegaly present.  Cardiovascular: Normal rate, regular rhythm, normal heart sounds and intact distal pulses.  Exam reveals no gallop and no friction rub.   No murmur heard. Pulmonary/Chest: Effort normal and breath sounds normal. No respiratory distress. He has no wheezes. He has no rales. He exhibits no tenderness.  Abdominal: Soft. Bowel sounds are normal. He exhibits no distension and no mass. There is no tenderness. There is no rebound and no guarding.  Musculoskeletal: Normal range of motion. He exhibits no edema or tenderness.  Lymphadenopathy:    He  has no cervical adenopathy.  Neurological: He is alert and oriented to person, place, and time. He has normal reflexes. No cranial nerve deficit. He exhibits normal muscle tone. He displays a negative Romberg sign. Coordination and gait normal.  Skin: Skin is warm and dry. Rash noted.  Psychiatric: He has a normal mood and affect. His behavior is normal. Judgment and thought content normal.   Obese Venouse stasis  dermatitis Lab Results  Component Value Date   WBC 7.1 05/07/2014   HGB 12.7* 05/07/2014   HCT 39.1 05/07/2014   PLT 250 05/07/2014   GLUCOSE 155* 09/12/2014   CHOL 183 05/07/2014   TRIG 120 05/07/2014   HDL 35* 05/07/2014   LDLDIRECT 149.9 03/12/2007   LDLCALC 124* 05/07/2014   ALT 34 05/07/2014   AST 20 05/07/2014   NA 139 09/12/2014   K 4.9 09/12/2014   CL 102 09/12/2014   CREATININE 1.05 09/12/2014   BUN 17 09/12/2014   CO2 28 09/12/2014   TSH 2.404 05/07/2014   PSA 1.03 09/12/2014   HGBA1C 7.3* 09/12/2014    Dg Finger Ring Right  02/11/2011   *RADIOLOGY REPORT*  Clinical Data: Right ring finger pain.  RIGHT RING FINGER 2+V  Comparison: None.  Findings: Anatomic alignment.  No fracture.  Soft tissues appear within normal limits. No radiopaque foreign body.  IMPRESSION: Negative.  Original Report Authenticated By: Andreas Newport, M.D.   Assessment & Plan:   Diagnoses and all orders for this visit:  Venous insufficiency  Type 2 diabetes mellitus without complication, without long-term current use of insulin (HCC)  Elevated PSA  Other orders -     mometasone (ELOCON) 0.1 % cream; Apply 1 application topically daily. -     Dietary Management Product (VASCULERA) TABS; Take 1 tablet by mouth 2 (two) times daily.  I have discontinued Mr. Bolich triamcinolone cream, cholecalciferol, and Fish Oil. I am also having him start on mometasone and VASCULERA. Additionally, I am having him maintain his glucose blood, aspirin EC, benazepril-hydrochlorthiazide, Linagliptin-Metformin HCl, tamsulosin, omeprazole, naproxen, CVS D3, and omega-3 acid ethyl esters.  Meds ordered this encounter  Medications  . CVS D3 1000 UNITS capsule    Sig: TAKE 1 TABLET (1,000 UNITS TOTAL) BY MOUTH DAILY.    Refill:  3  . omega-3 acid ethyl esters (LOVAZA) 1 G capsule    Sig: TAKE 1 CAPSULE (1,000 MG TOTAL) BY MOUTH 2 (TWO) TIMES DAILY.    Refill:  3  . mometasone (ELOCON) 0.1 % cream    Sig:  Apply 1 application topically daily.    Dispense:  45 g    Refill:  3  . Dietary Management Product (VASCULERA) TABS    Sig: Take 1 tablet by mouth 2 (two) times daily.    Dispense:  60 tablet    Refill:  11     Follow-up: Return in about 4 months (around 05/19/2015) for a follow-up visit.  Sonda Primes, MD

## 2015-01-16 NOTE — Progress Notes (Signed)
Pre visit review using our clinic review tool, if applicable. No additional management support is needed unless otherwise documented below in the visit note. 

## 2015-01-16 NOTE — Assessment & Plan Note (Signed)
Free PSA 

## 2015-01-16 NOTE — Assessment & Plan Note (Signed)
Elocon cream prn

## 2015-01-16 NOTE — Patient Instructions (Signed)
Stasis Dermatitis Stasis dermatitis occurs when veins lose the ability to pump blood back to the heart (poor venous circulation). It causes a reddish-purple to brownish scaly, itchy rash on the legs. The rash comes from pooling of blood (stasis). CAUSES  This occurs because the veins do not work very well anymore or because pressure may be increased in the veins due to other conditions. With blood pooling, the increased pressure in the tiny blood vessels (capillaries) causes fluid to leak out of the capillaries into the tissue. The extra fluid makes it harder for the blood to feed the cells and get rid of waste products. SYMPTOMS  Stasis dermatitis appears as red, scaly, itchy patches on the legs. A yellowish or light brown discoloration is also present. Due to scratching or other injury, these patches can become an ulcer. This ulcer may remain for long periods of time. The ulcer can also become infected. Swelling of the legs is often present with stasis dermatitis. If the leg is swollen, this increases the risk of infection and further damage to the skin. Sometimes, intense itching, tingling, and burning occurs before signs of stasis dermatitis appear. You may find yourself scratching the insides of your ankles or rubbing your ankles together before the rash appears. After healing, there are often brown spots on the affected skin. DIAGNOSIS  Your caregiver makes this diagnosis based on an exam. Other tests may be done to better understand the cause. TREATMENT  If underlying conditions are present, they must be treated. Some of these conditions are heart failure, thyroid problems, poor nutrition, and varicose veins.  Cortisone creams and ointments applied to the skin (topically) may be needed, as well as medicine to reduce swelling in the legs (diuretics).  Compression stockings or an elastic wrap may also be needed to reduce swelling.  If there is an infection, antibiotic medicines may also be  used. HOME CARE INSTRUCTIONS   Try to rest and raise (elevate) the affected leg above the level of the heart, if possible.  Burow's solution wet packs applied for 30 minutes, 3 times daily, will help the weepy rash. Stop using the packs before your skin gets too dry. You can also use a mixture of 3 parts white vinegar to 1 quart water.  Grease your legs daily with ointments, such as petroleum jelly, to fight dryness.  Avoid scratching or injuring the area. SEEK IMMEDIATE MEDICAL CARE IF:   Your rash gets worse.  An ulcer forms.  You have an oral temperature above 102 F (38.9 C), not controlled by medicine.  You have any other severe symptoms.   This information is not intended to replace advice given to you by your health care provider. Make sure you discuss any questions you have with your health care provider.   Document Released: 07/07/2005 Document Revised: 06/20/2011 Document Reviewed: 08/13/2014 Elsevier Interactive Patient Education 2016 [employer]  

## 2015-01-17 LAB — PSA, TOTAL AND FREE
PSA FREE PCT: 36 % (ref >25)
PSA FREE: 0.25 ng/mL
PSA: 0.7 ng/mL (ref <=4.00)

## 2015-01-23 ENCOUNTER — Ambulatory Visit: Payer: BLUE CROSS/BLUE SHIELD | Admitting: Internal Medicine

## 2015-05-15 ENCOUNTER — Other Ambulatory Visit: Payer: Self-pay | Admitting: Internal Medicine

## 2015-05-15 LAB — HEMOGLOBIN A1C
HEMOGLOBIN A1C: 7.7 % — AB (ref <5.7)
Mean Plasma Glucose: 174 mg/dL — ABNORMAL HIGH (ref <117)

## 2015-05-15 LAB — BASIC METABOLIC PANEL
BUN: 17 mg/dL (ref 7–25)
CALCIUM: 9.5 mg/dL (ref 8.6–10.3)
CO2: 30 mmol/L (ref 20–31)
CREATININE: 1.15 mg/dL (ref 0.70–1.33)
Chloride: 100 mmol/L (ref 98–110)
Glucose, Bld: 169 mg/dL — ABNORMAL HIGH (ref 65–99)
Potassium: 4.5 mmol/L (ref 3.5–5.3)
Sodium: 139 mmol/L (ref 135–146)

## 2015-05-22 ENCOUNTER — Ambulatory Visit (INDEPENDENT_AMBULATORY_CARE_PROVIDER_SITE_OTHER): Payer: BLUE CROSS/BLUE SHIELD | Admitting: Internal Medicine

## 2015-05-22 ENCOUNTER — Encounter: Payer: Self-pay | Admitting: Internal Medicine

## 2015-05-22 VITALS — BP 130/78 | HR 73 | Temp 97.5°F | Ht 71.0 in | Wt 324.2 lb

## 2015-05-22 DIAGNOSIS — E119 Type 2 diabetes mellitus without complications: Secondary | ICD-10-CM | POA: Diagnosis not present

## 2015-05-22 DIAGNOSIS — I10 Essential (primary) hypertension: Secondary | ICD-10-CM | POA: Diagnosis not present

## 2015-05-22 DIAGNOSIS — Z0000 Encounter for general adult medical examination without abnormal findings: Secondary | ICD-10-CM

## 2015-05-22 DIAGNOSIS — E559 Vitamin D deficiency, unspecified: Secondary | ICD-10-CM

## 2015-05-22 DIAGNOSIS — I872 Venous insufficiency (chronic) (peripheral): Secondary | ICD-10-CM

## 2015-05-22 DIAGNOSIS — R972 Elevated prostate specific antigen [PSA]: Secondary | ICD-10-CM

## 2015-05-22 MED ORDER — EMPAGLIFLOZIN 10 MG PO TABS: 10.0000 mg | ORAL_TABLET | Freq: Every day | ORAL | Status: DC

## 2015-05-22 NOTE — Assessment & Plan Note (Signed)
Chronic Lotensin HCT 

## 2015-05-22 NOTE — Progress Notes (Signed)
Subjective:  Patient ID: Andrew Carson, male    DOB: 12-27-1955  Age: 60 y.o. MRN: [id number]  CC: No chief complaint on file.   HPI Andrew Carson presents for HTN, DM, Obesity f/u. Pt gained some wt  Outpatient Prescriptions Prior to Visit  Medication Sig Dispense Refill  . benazepril-hydrochlorthiazide (LOTENSIN HCT) 20-12.5 MG per tablet Take 2 tablets by mouth daily. 180 tablet 11  . CVS D3 1000 UNITS capsule TAKE 1 TABLET (1,000 UNITS TOTAL) BY MOUTH DAILY.  3  . glucose blood test strip 1 each by Other route 2 (two) times daily. Use as instructed     . Linagliptin-Metformin HCl (JENTADUETO) 2.08-998 MG TABS TAKE 1 TABLET BY MOUTH 2 (TWO) TIMES DAILY. 180 tablet 3  . mometasone (ELOCON) 0.1 % cream Apply 1 application topically daily. 45 g 3  . naproxen (NAPROSYN) 500 MG tablet TAKE 1 TABLET BY MOUTH TWICE A DAY WITH A MEAL 180 tablet 2  . omega-3 acid ethyl esters (LOVAZA) 1 G capsule TAKE 1 CAPSULE (1,000 MG TOTAL) BY MOUTH 2 (TWO) TIMES DAILY.  3  . omeprazole (PRILOSEC) 20 MG capsule TAKE 1 CAPSULE (20 MG TOTAL) BY MOUTH DAILY. 90 capsule 3  . aspirin EC 81 MG tablet Take 81 mg by mouth daily.    . Dietary Management Product (VASCULERA) TABS Take 1 tablet by mouth 2 (two) times daily. 60 tablet 11  . tamsulosin (FLOMAX) 0.4 MG CAPS capsule Take 0.4 mg by mouth daily.  3   No facility-administered medications prior to visit.    ROS Review of Systems  Constitutional: Positive for unexpected weight change. Negative for appetite change and fatigue.  HENT: Negative for congestion, nosebleeds, sneezing, sore throat and trouble swallowing.   Eyes: Negative for itching and visual disturbance.  Respiratory: Negative for cough.   Cardiovascular: Negative for chest pain, palpitations and leg swelling.  Gastrointestinal: Negative for nausea, diarrhea, blood in stool and abdominal distention.  Genitourinary: Negative for frequency and hematuria.  Musculoskeletal: Negative for  back pain, joint swelling, gait problem and neck pain.  Skin: Negative for rash.  Neurological: Negative for dizziness, tremors, speech difficulty and weakness.  Psychiatric/Behavioral: Negative for suicidal ideas, sleep disturbance, dysphoric mood and agitation. The patient is not nervous/anxious.     Objective:  BP 130/78 mmHg  Pulse 73  Temp(Src) 97.5 F (36.4 C) (Oral)  Ht 5\' 11"  (1.803 m)  Wt 324 lb 4 oz (147.079 kg)  BMI 45.24 kg/m2  SpO2 98%  BP Readings from Last 3 Encounters:  05/22/15 130/78  01/16/15 110/78  09/12/14 130/82    Wt Readings from Last 3 Encounters:  05/22/15 324 lb 4 oz (147.079 kg)  01/16/15 318 lb (144.244 kg)  09/12/14 325 lb (147.419 kg)    Physical Exam  Constitutional: He is oriented to person, place, and time. He appears well-developed. No distress.  NAD  HENT:  Mouth/Throat: Oropharynx is clear and moist.  Eyes: Conjunctivae are normal. Pupils are equal, round, and reactive to light.  Neck: Normal range of motion. No JVD present. No thyromegaly present.  Cardiovascular: Normal rate, regular rhythm, normal heart sounds and intact distal pulses.  Exam reveals no gallop and no friction rub.   No murmur heard. Pulmonary/Chest: Effort normal and breath sounds normal. No respiratory distress. He has no wheezes. He has no rales. He exhibits no tenderness.  Abdominal: Soft. Bowel sounds are normal. He exhibits no distension and no mass. There is no tenderness. There is  no rebound and no guarding.  Musculoskeletal: Normal range of motion. He exhibits edema. He exhibits no tenderness.  Lymphadenopathy:    He has no cervical adenopathy.  Neurological: He is alert and oriented to person, place, and time. He has normal reflexes. No cranial nerve deficit. He exhibits normal muscle tone. He displays a negative Romberg sign. Coordination and gait normal.  Skin: Skin is warm and dry. No rash noted.  Psychiatric: He has a normal mood and affect. His behavior  is normal. Judgment and thought content normal.  LE edema  Lab Results  Component Value Date   WBC 8.1 01/16/2015   HGB 13.4 01/16/2015   HCT 40.3 01/16/2015   PLT 244 01/16/2015   GLUCOSE 169* 05/15/2015   CHOL 208* 01/16/2015   TRIG 178* 01/16/2015   HDL 28* 01/16/2015   LDLDIRECT 149.9 03/12/2007   LDLCALC 144* 01/16/2015   ALT 43 01/16/2015   AST 20 01/16/2015   NA 139 05/15/2015   K 4.5 05/15/2015   CL 100 05/15/2015   CREATININE 1.15 05/15/2015   BUN 17 05/15/2015   CO2 30 05/15/2015   TSH 2.404 05/07/2014   PSA 0.70 01/16/2015   HGBA1C 7.7* 05/15/2015    Dg Finger Ring Right  02/11/2011  *RADIOLOGY REPORT* Clinical Data: Right ring finger pain. RIGHT RING FINGER 2+V Comparison: None. Findings: Anatomic alignment.  No fracture.  Soft tissues appear within normal limits. No radiopaque foreign body. IMPRESSION: Negative. Original Report Authenticated By: Andrew Carson, M.D.   Assessment & Plan:   Diagnoses and all orders for this visit:  Essential hypertension  Type 2 diabetes mellitus without complication, without long-term current use of insulin (HCC) -     Hemoglobin A1c; Future  Well adult exam -     Basic metabolic panel; Future -     CBC with Differential/Platelet; Future -     Lipid panel; Future -     TSH; Future -     Urinalysis; Future -     PSA; Future -     Microalbumin / creatinine urine ratio; Future -     Hepatic function panel; Future  Other orders -     empagliflozin (JARDIANCE) 10 MG TABS tablet; Take 10 mg by mouth daily.  I have discontinued Mr. Andrew Carson tamsulosin and VASCULERA. I am also having him start on empagliflozin. Additionally, I am having him maintain his glucose blood, aspirin EC, benazepril-hydrochlorthiazide, Linagliptin-Metformin HCl, omeprazole, naproxen, CVS D3, omega-3 acid ethyl esters, and mometasone.  Meds ordered this encounter  Medications  . empagliflozin (JARDIANCE) 10 MG TABS tablet    Sig: Take 10 mg by  mouth daily.    Dispense:  30 tablet    Refill:  11     Follow-up: Return in about 4 months (around 09/19/2015) for Wellness Exam.  Sonda Primes, MD

## 2015-05-22 NOTE — Assessment & Plan Note (Signed)
Linagl-metformin 2/17 added Jardiance

## 2015-05-22 NOTE — Assessment & Plan Note (Signed)
PSA

## 2015-05-22 NOTE — Assessment & Plan Note (Signed)
Vit D 

## 2015-05-22 NOTE — Assessment & Plan Note (Signed)
Wt loss  

## 2015-05-22 NOTE — Progress Notes (Signed)
Pre visit review using our clinic review tool, if applicable. No additional management support is needed unless otherwise documented below in the visit note. 

## 2015-07-04 ENCOUNTER — Other Ambulatory Visit: Payer: Self-pay | Admitting: Internal Medicine

## 2015-07-06 ENCOUNTER — Other Ambulatory Visit: Payer: Self-pay | Admitting: Internal Medicine

## 2015-07-08 ENCOUNTER — Other Ambulatory Visit: Payer: Self-pay | Admitting: *Deleted

## 2015-07-08 MED ORDER — EMPAGLIFLOZIN 10 MG PO TABS: 10.0000 mg | ORAL_TABLET | Freq: Every day | ORAL | Status: DC

## 2015-07-30 ENCOUNTER — Other Ambulatory Visit: Payer: Self-pay | Admitting: Internal Medicine

## 2015-08-20 ENCOUNTER — Other Ambulatory Visit: Payer: Self-pay | Admitting: Internal Medicine

## 2015-09-12 ENCOUNTER — Other Ambulatory Visit: Payer: Self-pay | Admitting: Internal Medicine

## 2015-09-17 ENCOUNTER — Other Ambulatory Visit: Payer: Self-pay | Admitting: Internal Medicine

## 2015-09-18 ENCOUNTER — Other Ambulatory Visit: Payer: Self-pay | Admitting: Internal Medicine

## 2015-09-18 LAB — COMPREHENSIVE METABOLIC PANEL
ALT: 39 U/L (ref 9–46)
AST: 17 U/L (ref 10–35)
Albumin: 4 g/dL (ref 3.6–5.1)
Alkaline Phosphatase: 46 U/L (ref 40–115)
BILIRUBIN TOTAL: 0.4 mg/dL (ref 0.2–1.2)
BUN: 18 mg/dL (ref 7–25)
CHLORIDE: 105 mmol/L (ref 98–110)
CO2: 27 mmol/L (ref 20–31)
CREATININE: 1.26 mg/dL (ref 0.70–1.33)
Calcium: 9.5 mg/dL (ref 8.6–10.3)
Glucose, Bld: 146 mg/dL — ABNORMAL HIGH (ref 65–99)
Potassium: 4.6 mmol/L (ref 3.5–5.3)
SODIUM: 143 mmol/L (ref 135–146)
Total Protein: 6.8 g/dL (ref 6.1–8.1)

## 2015-09-18 LAB — CBC WITH DIFFERENTIAL/PLATELET
BASOS ABS: 77 {cells}/uL (ref 0–200)
Basophils Relative: 1 %
EOS ABS: 154 {cells}/uL (ref 15–500)
EOS PCT: 2 %
HCT: 42.1 % (ref 38.5–50.0)
Hemoglobin: 13.7 g/dL (ref 13.2–17.1)
Lymphocytes Relative: 21 %
Lymphs Abs: 1617 cells/uL (ref 850–3900)
MCH: 24.9 pg — AB (ref 27.0–33.0)
MCHC: 32.5 g/dL (ref 32.0–36.0)
MCV: 76.5 fL — AB (ref 80.0–100.0)
MONOS PCT: 12 %
MPV: 8.6 fL (ref 7.5–12.5)
Monocytes Absolute: 924 cells/uL (ref 200–950)
NEUTROS ABS: 4928 {cells}/uL (ref 1500–7800)
NEUTROS PCT: 64 %
PLATELETS: 260 10*3/uL (ref 140–400)
RBC: 5.5 MIL/uL (ref 4.20–5.80)
RDW: 15.4 % — ABNORMAL HIGH (ref 11.0–15.0)
WBC: 7.7 10*3/uL (ref 3.8–10.8)

## 2015-09-18 LAB — LIPID PANEL
Cholesterol: 201 mg/dL — ABNORMAL HIGH (ref 125–200)
HDL: 33 mg/dL — ABNORMAL LOW (ref >=40)
LDL CALC: 138 mg/dL — AB (ref <130)
Total CHOL/HDL Ratio: 6.1 Ratio — ABNORMAL HIGH (ref <=5.0)
Triglycerides: 149 mg/dL (ref <150)
VLDL: 30 mg/dL (ref <30)

## 2015-09-18 LAB — HEMOGLOBIN A1C
HEMOGLOBIN A1C: 6.8 % — AB (ref <5.7)
MEAN PLASMA GLUCOSE: 148 mg/dL

## 2015-09-18 LAB — TSH: TSH: 2.13 m[IU]/L (ref 0.40–4.50)

## 2015-09-19 LAB — URINALYSIS, MICROSCOPIC ONLY
Bacteria, UA: NONE SEEN [HPF]
Casts: NONE SEEN [LPF]
Crystals: NONE SEEN [HPF]
RBC / HPF: NONE SEEN RBC/HPF (ref <=2)
Squamous Epithelial / LPF: NONE SEEN [HPF] (ref <=5)
WBC UA: NONE SEEN WBC/HPF (ref <=5)
Yeast: NONE SEEN [HPF]

## 2015-09-19 LAB — URINALYSIS, ROUTINE W REFLEX MICROSCOPIC
BILIRUBIN URINE: NEGATIVE
Hgb urine dipstick: NEGATIVE
KETONES UR: NEGATIVE
Leukocytes, UA: NEGATIVE
Nitrite: NEGATIVE
PH: 6 (ref 5.0–8.0)
Protein, ur: NEGATIVE
Specific Gravity, Urine: 1.04 — ABNORMAL HIGH (ref 1.001–1.035)

## 2015-09-19 LAB — MICROALBUMIN, URINE: Microalb, Ur: 0.5 mg/dL

## 2015-09-19 LAB — PSA: PSA: 0.92 ng/mL (ref <=4.00)

## 2015-09-25 ENCOUNTER — Encounter: Payer: Self-pay | Admitting: Internal Medicine

## 2015-09-25 ENCOUNTER — Ambulatory Visit (INDEPENDENT_AMBULATORY_CARE_PROVIDER_SITE_OTHER): Payer: BLUE CROSS/BLUE SHIELD | Admitting: Internal Medicine

## 2015-09-25 VITALS — BP 130/76 | HR 89 | Ht 71.0 in | Wt 307.0 lb

## 2015-09-25 DIAGNOSIS — I10 Essential (primary) hypertension: Secondary | ICD-10-CM

## 2015-09-25 DIAGNOSIS — E119 Type 2 diabetes mellitus without complications: Secondary | ICD-10-CM

## 2015-09-25 DIAGNOSIS — Z0000 Encounter for general adult medical examination without abnormal findings: Secondary | ICD-10-CM

## 2015-09-25 DIAGNOSIS — E669 Obesity, unspecified: Secondary | ICD-10-CM

## 2015-09-25 DIAGNOSIS — Z1211 Encounter for screening for malignant neoplasm of colon: Secondary | ICD-10-CM

## 2015-09-25 NOTE — Progress Notes (Signed)
Pre visit review using our clinic review tool, if applicable. No additional management support is needed unless otherwise documented below in the visit note. 

## 2015-09-25 NOTE — Assessment & Plan Note (Signed)
Lost 17 lbs

## 2015-09-25 NOTE — Assessment & Plan Note (Signed)
BP Readings from Last 3 Encounters:  09/25/15 130/76  05/22/15 130/78  01/16/15 110/78

## 2015-09-25 NOTE — Patient Instructions (Signed)
Wt Readings from Last 3 Encounters:  09/25/15 307 lb (139.254 kg)  05/22/15 324 lb 4 oz (147.079 kg)  01/16/15 318 lb (144.244 kg)

## 2015-09-25 NOTE — Assessment & Plan Note (Addendum)
Better  Linagl-metformin Jardiance

## 2015-09-25 NOTE — Assessment & Plan Note (Signed)
We discussed age appropriate health related issues, including available/recomended screening tests and vaccinations. We discussed a need for adhering to healthy diet and exercise. Labs/EKG were reviewed/ordered. All questions were answered.  Colon Eye exam is due

## 2015-09-25 NOTE — Progress Notes (Signed)
Subjective:  Patient ID: Andrew Carson, male    DOB: [birth year]  Age: 60 y.o. MRN: [id number]  CC: Annual Exam   HPI Andrew Carson presents for a well exam - lost wt on diet F/u DM, HTN, lymphedema  Outpatient Prescriptions Prior to Visit  Medication Sig Dispense Refill  . benazepril-hydrochlorthiazide (LOTENSIN HCT) 20-12.5 MG tablet TAKE 2 TABLETS BY MOUTH EVERY DAY 180 tablet 3  . [facility] D3 1000 UNITS capsule TAKE 1 TABLET (1,000 UNITS TOTAL) BY MOUTH DAILY.  3  . empagliflozin (JARDIANCE) 10 MG TABS tablet Take 10 mg by mouth daily. 90 tablet 3  . glucose blood test strip 1 each by Other route 2 (two) times daily. Use as instructed     . JENTADUETO 2.08-998 MG TABS TAKE 1 TABLET BY MOUTH TWICE A DAY 180 tablet 3  . mometasone (ELOCON) 0.1 % cream Apply 1 application topically daily. 45 g 3  . naproxen (NAPROSYN) 500 MG tablet TAKE 1 TABLET BY MOUTH TWICE A DAY WITH A MEAL 180 tablet 2  . omega-3 acid ethyl esters (LOVAZA) 1 G capsule TAKE 1 CAPSULE (1,000 MG TOTAL) BY MOUTH 2 (TWO) TIMES DAILY.  3  . omeprazole (PRILOSEC) 20 MG capsule TAKE 1 CAPSULE (20 MG TOTAL) BY MOUTH DAILY. 90 capsule 3  . naproxen (NAPROSYN) 500 MG tablet TAKE 1 TABLET BY MOUTH TWICE A DAY WITH A MEAL 180 tablet 2  . omega-3 acid ethyl esters (LOVAZA) 1 g capsule TAKE 1 CAPSULE (1,000 MG TOTAL) BY MOUTH 2 (TWO) TIMES DAILY. 180 capsule 3  . aspirin EC 81 MG tablet Take 81 mg by mouth daily.    . [facility]S D3 1000 units capsule TAKE 1 TABLET (1,000 UNITS TOTAL) BY MOUTH DAILY. (Patient not taking: Reported on 09/25/2015) 90 capsule 3   No facility-administered medications prior to visit.    ROS Review of Systems  Constitutional: Negative for appetite change, fatigue and unexpected weight change.  HENT: Negative for congestion, nosebleeds, sneezing, sore throat and trouble swallowing.   Eyes: Negative for itching and visual disturbance.  Respiratory: Negative for cough.   Cardiovascular: Negative for chest  pain, palpitations and leg swelling.  Gastrointestinal: Negative for nausea, diarrhea, blood in stool and abdominal distention.  Genitourinary: Negative for frequency and hematuria.  Musculoskeletal: Negative for back pain, joint swelling, gait problem and neck pain.  Skin: Positive for rash.  Neurological: Negative for dizziness, tremors, speech difficulty, weakness and light-headedness.  Psychiatric/Behavioral: Negative for sleep disturbance, dysphoric mood and agitation. The patient is not nervous/anxious.     Objective:  BP 130/76 mmHg  Pulse 89  Ht 5\' 11"  (1.803 m)  Wt 307 lb (139.254 kg)  BMI 42.84 kg/m2  SpO2 96%  BP Readings from Last 3 Encounters:  09/25/15 130/76  05/22/15 130/78  01/16/15 110/78    Wt Readings from Last 3 Encounters:  09/25/15 307 lb (139.254 kg)  05/22/15 324 lb 4 oz (147.079 kg)  01/16/15 318 lb (144.244 kg)    Physical Exam  Constitutional: He is oriented to person, place, and time. He appears well-developed. No distress.  NAD  HENT:  Mouth/Throat: Oropharynx is clear and moist.  Eyes: Conjunctivae are normal. Pupils are equal, round, and reactive to light.  Neck: Normal range of motion. No JVD present. No thyromegaly present.  Cardiovascular: Normal rate, regular rhythm, normal heart sounds and intact distal pulses.  Exam reveals no gallop and no friction rub.   No murmur heard. Pulmonary/Chest: Effort normal  and breath sounds normal. No respiratory distress. He has no wheezes. He has no rales. He exhibits no tenderness.  Abdominal: Soft. Bowel sounds are normal. He exhibits no distension and no mass. There is no tenderness. There is no rebound and no guarding.  Musculoskeletal: Normal range of motion. He exhibits edema. He exhibits no tenderness.  Lymphadenopathy:    He has no cervical adenopathy.  Neurological: He is alert and oriented to person, place, and time. He has normal reflexes. No cranial nerve deficit. He exhibits normal muscle  tone. He displays a negative Romberg sign. Coordination and gait normal.  Skin: Skin is warm and dry. Rash noted.  Psychiatric: He has a normal mood and affect. His behavior is normal. Judgment and thought content normal.  hyperpigm on R shin Rectal - def to GI Obese  Lab Results  Component Value Date   WBC 7.7 09/18/2015   HGB 13.7 09/18/2015   HCT 42.1 09/18/2015   PLT 260 09/18/2015   GLUCOSE 146* 09/18/2015   CHOL 201* 09/18/2015   TRIG 149 09/18/2015   HDL 33* 09/18/2015   LDLDIRECT 149.9 03/12/2007   LDLCALC 138* 09/18/2015   ALT 39 09/18/2015   AST 17 09/18/2015   NA 143 09/18/2015   K 4.6 09/18/2015   CL 105 09/18/2015   CREATININE 1.26 09/18/2015   BUN 18 09/18/2015   CO2 27 09/18/2015   TSH 2.13 09/18/2015   PSA 0.92 09/18/2015   HGBA1C 6.8* 09/18/2015   MICROALBUR 0.5 09/18/2015    Dg Finger Ring Right  02/11/2011  *RADIOLOGY REPORT* Clinical Data: Right ring finger pain. RIGHT RING FINGER 2+V Comparison: None. Findings: Anatomic alignment.  No fracture.  Soft tissues appear within normal limits. No radiopaque foreign body. IMPRESSION: Negative. Original Report Authenticated By: Andreas Newport, M.D.   Assessment & Plan:   There are no diagnoses linked to this encounter. I am having Andrew Carson maintain his glucose blood, aspirin EC, omeprazole, CVS D3, omega-3 acid ethyl esters, mometasone, empagliflozin, benazepril-hydrochlorthiazide, JENTADUETO, and naproxen.  No orders of the defined types were placed in this encounter.     Follow-up: No Follow-up on file.  Sonda Primes, MD

## 2015-10-05 ENCOUNTER — Encounter: Payer: Self-pay | Admitting: Internal Medicine

## 2015-11-17 ENCOUNTER — Other Ambulatory Visit: Payer: Self-pay | Admitting: Internal Medicine

## 2015-12-07 ENCOUNTER — Ambulatory Visit: Payer: BLUE CROSS/BLUE SHIELD | Admitting: *Deleted

## 2015-12-07 VITALS — Ht 70.0 in | Wt 303.6 lb

## 2015-12-07 DIAGNOSIS — Z1211 Encounter for screening for malignant neoplasm of colon: Secondary | ICD-10-CM

## 2015-12-07 NOTE — Progress Notes (Signed)
Patient denies any allergies to egg or soy products. Patient denies complications with anesthesia/sedation.  Patient denies oxygen use at home and denies diet medications. Emmi instructions for colonoscopy explained but patient refused.  Pamphlet given. 

## 2015-12-10 ENCOUNTER — Encounter: Payer: Self-pay | Admitting: Internal Medicine

## 2015-12-24 ENCOUNTER — Encounter: Payer: Self-pay | Admitting: Internal Medicine

## 2015-12-24 ENCOUNTER — Ambulatory Visit (AMBULATORY_SURGERY_CENTER): Payer: BLUE CROSS/BLUE SHIELD | Admitting: Internal Medicine

## 2015-12-24 VITALS — BP 120/75 | HR 71 | Temp 98.6°F | Resp 14 | Ht 70.0 in | Wt 303.0 lb

## 2015-12-24 DIAGNOSIS — Z1211 Encounter for screening for malignant neoplasm of colon: Secondary | ICD-10-CM

## 2015-12-24 MED ORDER — SODIUM CHLORIDE 0.9 % IV SOLN: 500.0000 mL | INTRAVENOUS | Status: DC

## 2015-12-24 NOTE — Progress Notes (Signed)
To PACU  Awake and alert.  Report to RN 

## 2015-12-24 NOTE — Op Note (Signed)
[facility] Patient Name: Andrew Carson Procedure Date: 12/24/2015 8:05 AM MRN: [id number] Endoscopist: Iva Booparl E Gessner , MD Age: 60 Referring MD:  Date of Birth: 10-31-1955 Gender: Male Account #: [id number] Procedure:                Colonoscopy Indications:              Screening for colorectal malignant neoplasm Medicines:                Propofol per Anesthesia, Monitored Anesthesia Care Procedure:                Pre-Anesthesia Assessment:                           - Prior to the procedure, a History and Physical                            was performed, and patient medications and                            allergies were reviewed. The patient's tolerance of                            previous anesthesia was also reviewed. The risks                            and benefits of the procedure and the sedation                            options and risks were discussed with the patient.                            All questions were answered, and informed consent                            was obtained. Prior Anticoagulants: The patient has                            taken no previous anticoagulant or antiplatelet                            agents. ASA Grade Assessment: II - A patient with                            mild systemic disease. After reviewing the risks                            and benefits, the patient was deemed in                            satisfactory condition to undergo the procedure.                           After obtaining informed consent, the colonoscope  was passed under direct vision. Throughout the                            procedure, the patient's blood pressure, pulse, and                            oxygen saturations were monitored continuously. The                            Model CF-HQ190L [id number]) scope was introduced                            through the anus and advanced to the the cecum,       identified by appendiceal orifice and ileocecal                            valve. The ileocecal valve, appendiceal orifice,                            and rectum were photographed. The quality of the                            bowel preparation was excellent. The bowel                            preparation used was Miralax. Scope In: 8:13:40 AM Scope Out: 8:24:06 AM Scope Withdrawal Time: 0 hours 8 minutes 33 seconds  Total Procedure Duration: 0 hours 10 minutes 26 seconds  Findings:                 The perianal and digital rectal examinations were                            normal. Pertinent negatives include normal prostate                            (size, shape, and consistency).                           A few diverticula were found in the sigmoid colon.                           The exam was otherwise without abnormality on                            direct and retroflexion views. Complications:            No immediate complications. Estimated blood loss:                            None. Estimated Blood Loss:     Estimated blood loss: none. Recommendation:           - Repeat colonoscopy in 10 years for screening  purposes.                           - Resume previous diet.                           - Continue present medications.                           - Patient has a contact number available for                            emergencies. The signs and symptoms of potential                            delayed complications were discussed with the                            patient. Return to normal activities tomorrow.                            Written discharge instructions were provided to the                            patient. Iva Boop, MD 12/24/2015 8:32:04 AM This report has been signed electronically.

## 2015-12-24 NOTE — Patient Instructions (Addendum)
No polyps seen!  You do have diverticulosis - thickened muscle rings and pouches in the colon wall. Very mild and unlikely to be a problem. Please read the handout about this condition.  Next routine colonoscopy in 10 years - 2027  I appreciate the opportunity to care for you. Iva Boop, MD, FACG   YOU HAD AN ENDOSCOPIC PROCEDURE TODAY AT THE [facility]:   Refer to the procedure report that was given to you for any specific questions about what was found during the examination.  If the procedure report does not answer your questions, please call your gastroenterologist to clarify.  If you requested that your care partner not be given the details of your procedure findings, then the procedure report has been included in a sealed envelope for you to review at your convenience later.  YOU SHOULD EXPECT: Some feelings of bloating in the abdomen. Passage of more gas than usual.  Walking can help get rid of the air that was put into your GI tract during the procedure and reduce the bloating. If you had a lower endoscopy (such as a colonoscopy or flexible sigmoidoscopy) you may notice spotting of blood in your stool or on the toilet paper. If you underwent a bowel prep for your procedure, you may not have a normal bowel movement for a few days.  Please Note:  You might notice some irritation and congestion in your nose or some drainage.  This is from the oxygen used during your procedure.  There is no need for concern and it should clear up in a day or so.  SYMPTOMS TO REPORT IMMEDIATELY:   Following lower endoscopy (colonoscopy or flexible sigmoidoscopy):  Excessive amounts of blood in the stool  Significant tenderness or worsening of abdominal pains  Swelling of the abdomen that is new, acute  Fever of 100F or higher   For urgent or emergent issues, a gastroenterologist can be reached at any hour by calling [phone].   DIET:  We do recommend a small meal at  first, but then you may proceed to your regular diet.  Drink plenty of fluids but you should avoid alcoholic beverages for 24 hours.  ACTIVITY:  You should plan to take it easy for the rest of today and you should NOT DRIVE or use heavy machinery until tomorrow (because of the sedation medicines used during the test).    FOLLOW UP: Our staff will call the number listed on your records the next business day following your procedure to check on you and address any questions or concerns that you may have regarding the information given to you following your procedure. If we do not reach you, we will leave a message.  However, if you are feeling well and you are not experiencing any problems, there is no need to return our call.  We will assume that you have returned to your regular daily activities without incident.  If any biopsies were taken you will be contacted by phone or by letter within the next 1-3 weeks.  Please call us at [phone] if you have not heard about the biopsies in 3 weeks.    SIGNATURES/CONFIDENTIALITY: You and/or your care partner have signed paperwork which will be entered into your electronic medical record.  These signatures attest to the fact that that the information above on your After Visit Summary has been reviewed and is understood.  Full responsibility of the confidentiality of this discharge information lies with you  and/or your care-partner. 

## 2015-12-25 ENCOUNTER — Telehealth: Payer: Self-pay

## 2015-12-25 NOTE — Telephone Encounter (Signed)
  Follow up Call-  Call back number 12/24/2015  Post procedure Call Back phone  # [phone]  Permission to leave phone message Yes  Some recent data might be hidden     Patient questions:  Do you have a fever, pain , or abdominal swelling? No. Pain Score  0 *  Have you tolerated food without any problems? Yes.    Have you been able to return to your normal activities? Yes.    Do you have any questions about your discharge instructions: Diet   No. Medications  No. Follow up visit  No.  Do you have questions or concerns about your Care? No.  Actions: * If pain score is 4 or above: No action needed, pain <4.

## 2016-01-29 ENCOUNTER — Encounter: Payer: Self-pay | Admitting: Internal Medicine

## 2016-01-29 ENCOUNTER — Ambulatory Visit (INDEPENDENT_AMBULATORY_CARE_PROVIDER_SITE_OTHER): Payer: BLUE CROSS/BLUE SHIELD | Admitting: Internal Medicine

## 2016-01-29 DIAGNOSIS — N471 Phimosis: Secondary | ICD-10-CM | POA: Diagnosis not present

## 2016-01-29 DIAGNOSIS — I10 Essential (primary) hypertension: Secondary | ICD-10-CM

## 2016-01-29 DIAGNOSIS — E119 Type 2 diabetes mellitus without complications: Secondary | ICD-10-CM

## 2016-01-29 HISTORY — DX: Phimosis: N47.1

## 2016-01-29 MED ORDER — KETOCONAZOLE 2 % EX CREA: 1.0000 "application " | TOPICAL_CREAM | Freq: Two times a day (BID) | CUTANEOUS | 1 refills | Status: DC

## 2016-01-29 NOTE — Assessment & Plan Note (Signed)
10/17  ???due to Jardiance Letoconazole cream - let me know if not better

## 2016-01-29 NOTE — Progress Notes (Signed)
Subjective:  Patient ID: Andrew Carson, male    DOB: Nov 26, 1955  Age: 60 y.o. MRN: [id number]  CC: No chief complaint on file.   HPI Andrew Carson presents for HTN, DM, GERD f/u. C/o foreskin irritation x 3-4 mo  Outpatient Medications Prior to Visit  Medication Sig Dispense Refill  . benazepril-hydrochlorthiazide (LOTENSIN HCT) 20-12.5 MG tablet TAKE 2 TABLETS BY MOUTH EVERY DAY 180 tablet 3  . [facility]S D3 1000 UNITS capsule TAKE 1 TABLET (1,000 UNITS TOTAL) BY MOUTH DAILY.  3  . empagliflozin (JARDIANCE) 10 MG TABS tablet Take 10 mg by mouth daily. 90 tablet 3  . glucose blood test strip 1 each by Other route 2 (two) times daily. Use as instructed     . JENTADUETO 2.08-998 MG TABS TAKE 1 TABLET BY MOUTH TWICE A DAY 180 tablet 3  . mometasone (ELOCON) 0.1 % cream Apply 1 application topically daily. 45 g 3  . naproxen (NAPROSYN) 500 MG tablet TAKE 1 TABLET BY MOUTH TWICE A DAY WITH A MEAL 180 tablet 2  . omega-3 acid ethyl esters (LOVAZA) 1 G capsule TAKE 1 CAPSULE (1,000 MG TOTAL) BY MOUTH 2 (TWO) TIMES DAILY.  3  . omeprazole (PRILOSEC) 20 MG capsule TAKE ONE CAPSULE BY MOUTH EVERY DAY 90 capsule 3  . polyethylene glycol powder (GLYCOLAX/MIRALAX) powder Take 1 Container by mouth once. Miralax 238 grams as directed for colonoscopy prep.    Marland Kitchen aspirin EC 81 MG tablet Take 81 mg by mouth daily.    . bisacodyl (DULCOLAX) 5 MG EC tablet Take 5 mg by mouth daily as needed for moderate constipation. Dulcolax 5 mg tab take as directed for colonoscopy prep.     Facility-Administered Medications Prior to Visit  Medication Dose Route Frequency Provider Last Rate Last Dose  . 0.9 %  sodium chloride infusion  500 mL Intravenous Continuous Iva Boop, MD        ROS Review of Systems  Constitutional: Negative for appetite change, fatigue and unexpected weight change.  HENT: Negative for congestion, nosebleeds, sneezing, sore throat and trouble swallowing.   Eyes: Negative for itching and  visual disturbance.  Respiratory: Negative for cough.   Cardiovascular: Negative for chest pain, palpitations and leg swelling.  Gastrointestinal: Negative for abdominal distention, blood in stool, diarrhea and nausea.  Genitourinary: Negative for frequency and hematuria.  Musculoskeletal: Negative for back pain, gait problem, joint swelling and neck pain.  Skin: Positive for rash.  Neurological: Negative for dizziness, tremors, speech difficulty and weakness.  Psychiatric/Behavioral: Negative for agitation, dysphoric mood and sleep disturbance. The patient is not nervous/anxious.     Objective:  BP 140/90   Pulse 90   Wt (!) 309 lb (140.2 kg)   SpO2 96%   BMI 44.34 kg/m   BP Readings from Last 3 Encounters:  01/29/16 140/90  12/24/15 120/75  09/25/15 130/76    Wt Readings from Last 3 Encounters:  01/29/16 (!) 309 lb (140.2 kg)  12/24/15 (!) 303 lb (137.4 kg)  12/07/15 (!) 303 lb 9.6 oz (137.7 kg)    Physical Exam  Constitutional: He is oriented to person, place, and time. He appears well-developed. No distress.  NAD  HENT:  Mouth/Throat: Oropharynx is clear and moist.  Eyes: Conjunctivae are normal. Pupils are equal, round, and reactive to light.  Neck: Normal range of motion. No JVD present. No thyromegaly present.  Cardiovascular: Normal rate, regular rhythm, normal heart sounds and intact distal pulses.  Exam reveals no  gallop and no friction rub.   No murmur heard. Pulmonary/Chest: Effort normal and breath sounds normal. No respiratory distress. He has no wheezes. He has no rales. He exhibits no tenderness.  Abdominal: Soft. Bowel sounds are normal. He exhibits no distension and no mass. There is no tenderness. There is no rebound and no guarding.  Musculoskeletal: Normal range of motion. He exhibits no edema or tenderness.  Lymphadenopathy:    He has no cervical adenopathy.  Neurological: He is alert and oriented to person, place, and time. He has normal reflexes.  No cranial nerve deficit. He exhibits normal muscle tone. He displays a negative Romberg sign. Coordination and gait normal.  Skin: Skin is warm and dry. No rash noted.  Psychiatric: He has a normal mood and affect. His behavior is normal. Judgment and thought content normal.  Penis with swollen foreskin and glance penis erythema  Lab Results  Component Value Date   WBC 7.7 09/18/2015   HGB 13.7 09/18/2015   HCT 42.1 09/18/2015   PLT 260 09/18/2015   GLUCOSE 146 (H) 09/18/2015   CHOL 201 (H) 09/18/2015   TRIG 149 09/18/2015   HDL 33 (L) 09/18/2015   LDLDIRECT 149.9 03/12/2007   LDLCALC 138 (H) 09/18/2015   ALT 39 09/18/2015   AST 17 09/18/2015   NA 143 09/18/2015   K 4.6 09/18/2015   CL 105 09/18/2015   CREATININE 1.26 09/18/2015   BUN 18 09/18/2015   CO2 27 09/18/2015   TSH 2.13 09/18/2015   PSA 0.92 09/18/2015   HGBA1C 6.8 (H) 09/18/2015   MICROALBUR 0.5 09/18/2015    Dg Finger Ring Right  Result Date: 02/11/2011 *RADIOLOGY REPORT* Clinical Data: Right ring finger pain. RIGHT RING FINGER 2+V Comparison: None. Findings: Anatomic alignment.  No fracture.  Soft tissues appear within normal limits. No radiopaque foreign body. IMPRESSION: Negative. Original Report Authenticated By: Andrew Carson, M.D.   Assessment & Plan:   There are no diagnoses linked to this encounter. I have discontinued Andrew Carson's bisacodyl. I am also having him maintain his glucose blood, aspirin EC, CVS D3, omega-3 acid ethyl esters, mometasone, empagliflozin, benazepril-hydrochlorthiazide, JENTADUETO, naproxen, omeprazole, and polyethylene glycol powder. We will continue to administer sodium chloride.  No orders of the defined types were placed in this encounter.    Follow-up: No Follow-up on file.  Andrew PrimesAlex Lj Miyamoto, MD

## 2016-01-29 NOTE — Assessment & Plan Note (Signed)
  Lotensin HCT 

## 2016-01-29 NOTE — Progress Notes (Signed)
Pre visit review using our clinic review tool, if applicable. No additional management support is needed unless otherwise documented below in the visit note. 

## 2016-01-29 NOTE — Assessment & Plan Note (Signed)
A1c 6.8% last week Cont w/Jardiance unless phimosis persists

## 2016-02-10 ENCOUNTER — Other Ambulatory Visit: Payer: Self-pay | Admitting: Internal Medicine

## 2016-04-19 ENCOUNTER — Telehealth: Payer: Self-pay | Admitting: *Deleted

## 2016-04-19 NOTE — Telephone Encounter (Signed)
Rec fax from pharmacy stating Jardiance 10 mg is not covered. They are requesting a change to [place] OR [employer].   Please advise.

## 2016-04-20 ENCOUNTER — Other Ambulatory Visit: Payer: Self-pay | Admitting: Internal Medicine

## 2016-04-20 MED ORDER — DAPAGLIFLOZIN PROPANEDIOL 5 MG PO TABS: 5.0000 mg | ORAL_TABLET | Freq: Every day | ORAL | 11 refills | Status: DC

## 2016-04-20 NOTE — Telephone Encounter (Signed)
Rx and savings card are upfront for p/u. Pt informed

## 2016-04-20 NOTE — Telephone Encounter (Signed)
Ok [place] Thx

## 2016-05-16 ENCOUNTER — Telehealth: Payer: Self-pay | Admitting: Internal Medicine

## 2016-05-16 ENCOUNTER — Encounter: Payer: Self-pay | Admitting: Internal Medicine

## 2016-05-16 NOTE — Telephone Encounter (Signed)
Forms for Social Services completed, copy scanned in and given for patient.

## 2016-05-30 LAB — HEMOGLOBIN A1C: HEMOGLOBIN A1C: 6.7

## 2016-05-30 LAB — BASIC METABOLIC PANEL
BUN: 20 mg/dL (ref 4–21)
Creatinine: 1.2 mg/dL (ref 0.6–1.3)
Glucose: 137 mg/dL
POTASSIUM: 4.6 mmol/L (ref 3.4–5.3)
Sodium: 142 mmol/L (ref 137–147)

## 2016-06-03 ENCOUNTER — Ambulatory Visit (INDEPENDENT_AMBULATORY_CARE_PROVIDER_SITE_OTHER): Payer: BLUE CROSS/BLUE SHIELD | Admitting: Internal Medicine

## 2016-06-03 ENCOUNTER — Telehealth: Payer: Self-pay

## 2016-06-03 ENCOUNTER — Encounter: Payer: Self-pay | Admitting: Internal Medicine

## 2016-06-03 DIAGNOSIS — E119 Type 2 diabetes mellitus without complications: Secondary | ICD-10-CM

## 2016-06-03 DIAGNOSIS — I872 Venous insufficiency (chronic) (peripheral): Secondary | ICD-10-CM

## 2016-06-03 DIAGNOSIS — R972 Elevated prostate specific antigen [PSA]: Secondary | ICD-10-CM | POA: Diagnosis not present

## 2016-06-03 DIAGNOSIS — K219 Gastro-esophageal reflux disease without esophagitis: Secondary | ICD-10-CM | POA: Diagnosis not present

## 2016-06-03 NOTE — Assessment & Plan Note (Signed)
Monitoring PSA 

## 2016-06-03 NOTE — Assessment & Plan Note (Signed)
  On diet  

## 2016-06-03 NOTE — Progress Notes (Signed)
Subjective:  Patient ID: Andrew Carson, male    DOB: [birth year]  Age: 61 y.o. MRN: [id number]  CC: Follow-up (HTN, Type 2 )   HPI Andrew Carson presents for HTN, DM, GERD f/u  Outpatient Medications Prior to Visit  Medication Sig Dispense Refill  . benazepril-hydrochlorthiazide (LOTENSIN HCT) 20-12.5 MG tablet TAKE 2 TABLETS BY MOUTH EVERY DAY 180 tablet 3  . [facility] D3 1000 UNITS capsule TAKE 1 TABLET (1,000 UNITS TOTAL) BY MOUTH DAILY.  3  . dapagliflozin propanediol (FARXIGA) 5 MG TABS tablet Take 5 mg by mouth daily. 30 tablet 11  . glucose blood test strip 1 each by Other route 2 (two) times daily. Use as instructed     . JENTADUETO 2.08-998 MG TABS TAKE 1 TABLET BY MOUTH TWICE A DAY 180 tablet 3  . ketoconazole (NIZORAL) 2 % cream Apply 1 application topically 2 (two) times daily. 45 g 1  . mometasone (ELOCON) 0.1 % cream APPLY TO AFFECTED AREA EVERY DAY 45 g 1  . naproxen (NAPROSYN) 500 MG tablet TAKE 1 TABLET BY MOUTH TWICE A DAY WITH A MEAL 180 tablet 2  . omega-3 acid ethyl esters (LOVAZA) 1 G capsule TAKE 1 CAPSULE (1,000 MG TOTAL) BY MOUTH 2 (TWO) TIMES DAILY.  3  . omeprazole (PRILOSEC) 20 MG capsule TAKE ONE CAPSULE BY MOUTH EVERY DAY 90 capsule 3  . aspirin EC 81 MG tablet Take 81 mg by mouth daily.    . polyethylene glycol powder (GLYCOLAX/MIRALAX) powder Take 1 Container by mouth once. Miralax 238 grams as directed for colonoscopy prep.     Facility-Administered Medications Prior to Visit  Medication Dose Route Frequency Provider Last Rate Last Dose  . 0.9 %  sodium chloride infusion  500 mL Intravenous Continuous Iva Boop, MD        ROS Review of Systems  Constitutional: Negative for appetite change, fatigue and unexpected weight change.  HENT: Negative for congestion, nosebleeds, sneezing, sore throat and trouble swallowing.   Eyes: Negative for itching and visual disturbance.  Respiratory: Negative for cough.   Cardiovascular: Negative for chest pain,  palpitations and leg swelling.  Gastrointestinal: Negative for abdominal distention, blood in stool, diarrhea and nausea.  Genitourinary: Negative for frequency and hematuria.  Musculoskeletal: Negative for back pain, gait problem, joint swelling and neck pain.  Skin: Negative for rash.  Neurological: Negative for dizziness, tremors, speech difficulty and weakness.  Psychiatric/Behavioral: Negative for agitation, dysphoric mood and sleep disturbance. The patient is not nervous/anxious.     Objective:  BP 114/72   Pulse (!) 111   Temp 98.4 F (36.9 C) (Oral)   Resp 16   Ht 5\' 10"  (1.778 m)   Wt (!) 308 lb (139.7 kg)   SpO2 96%   BMI 44.19 kg/m   BP Readings from Last 3 Encounters:  06/03/16 114/72  01/29/16 140/90  12/24/15 120/75    Wt Readings from Last 3 Encounters:  06/03/16 (!) 308 lb (139.7 kg)  01/29/16 (!) 309 lb (140.2 kg)  12/24/15 (!) 303 lb (137.4 kg)    Physical Exam  Constitutional: He is oriented to person, place, and time. He appears well-developed. No distress.  NAD  HENT:  Mouth/Throat: Oropharynx is clear and moist.  Eyes: Conjunctivae are normal. Pupils are equal, round, and reactive to light.  Neck: Normal range of motion. No JVD present. No thyromegaly present.  Cardiovascular: Normal rate, regular rhythm, normal heart sounds and intact distal pulses.  Exam reveals  no gallop and no friction rub.   No murmur heard. Pulmonary/Chest: Effort normal and breath sounds normal. No respiratory distress. He has no wheezes. He has no rales. He exhibits no tenderness.  Abdominal: Soft. Bowel sounds are normal. He exhibits no distension and no mass. There is no tenderness. There is no rebound and no guarding.  Musculoskeletal: Normal range of motion. He exhibits no edema or tenderness.  Lymphadenopathy:    He has no cervical adenopathy.  Neurological: He is alert and oriented to person, place, and time. He has normal reflexes. No cranial nerve deficit. He  exhibits normal muscle tone. He displays a negative Romberg sign. Coordination and gait normal.  Skin: Skin is warm and dry. No rash noted.  Psychiatric: He has a normal mood and affect. His behavior is normal. Judgment and thought content normal.    Lab Results  Component Value Date   WBC 7.7 09/18/2015   HGB 13.7 09/18/2015   HCT 42.1 09/18/2015   PLT 260 09/18/2015   GLUCOSE 146 (H) 09/18/2015   CHOL 201 (H) 09/18/2015   TRIG 149 09/18/2015   HDL 33 (L) 09/18/2015   LDLDIRECT 149.9 03/12/2007   LDLCALC 138 (H) 09/18/2015   ALT 39 09/18/2015   AST 17 09/18/2015   NA 143 09/18/2015   K 4.6 09/18/2015   CL 105 09/18/2015   CREATININE 1.26 09/18/2015   BUN 18 09/18/2015   CO2 27 09/18/2015   TSH 2.13 09/18/2015   PSA 0.92 09/18/2015   HGBA1C 6.8 (H) 09/18/2015   MICROALBUR 0.5 09/18/2015    Dg Finger Ring Right  Result Date: 02/11/2011 *RADIOLOGY REPORT* Clinical Data: Right ring finger pain. RIGHT RING FINGER 2+V Comparison: None. Findings: Anatomic alignment.  No fracture.  Soft tissues appear within normal limits. No radiopaque foreign body. IMPRESSION: Negative. Original Report Authenticated By: Andreas Newport, M.D.   Assessment & Plan:   There are no diagnoses linked to this encounter. I have discontinued Mr. Zervos polyethylene glycol powder. I am also having him maintain his glucose blood, aspirin EC, [facility] D3, omega-3 acid ethyl esters, benazepril-hydrochlorthiazide, JENTADUETO, naproxen, omeprazole, ketoconazole, mometasone, and dapagliflozin propanediol. We will continue to administer sodium chloride.  No orders of the defined types were placed in this encounter.    Follow-up: No Follow-up on file.  Sonda Primes, MD

## 2016-06-03 NOTE — Assessment & Plan Note (Signed)
Labs last week.

## 2016-06-03 NOTE — Telephone Encounter (Signed)
Spoke with patient about labs from [facility]. Patient stated he had labs done with them recently last week 05/23/16-05/27/16. Contacted [facility] and they stated he only had labs from 05/2015 and 09/2015.Marland KitchenMarland Kitchen

## 2016-06-03 NOTE — Progress Notes (Signed)
Pre-visit discussion using our clinic review tool. No additional management support is needed unless otherwise documented below in the visit note.  

## 2016-06-03 NOTE — Assessment & Plan Note (Signed)
Compr socks 

## 2016-06-14 ENCOUNTER — Encounter: Payer: Self-pay | Admitting: Internal Medicine

## 2016-06-14 NOTE — Progress Notes (Unsigned)
Results entered and sent to scan  

## 2016-06-15 ENCOUNTER — Other Ambulatory Visit: Payer: Self-pay | Admitting: Internal Medicine

## 2016-07-15 ENCOUNTER — Other Ambulatory Visit: Payer: Self-pay | Admitting: Internal Medicine

## 2016-08-06 ENCOUNTER — Other Ambulatory Visit: Payer: Self-pay | Admitting: Internal Medicine

## 2016-08-20 LAB — HEPATIC FUNCTION PANEL
ALK PHOS: 50 U/L (ref 25–125)
ALT: 28 U/L (ref 10–40)
AST: 16 U/L (ref 14–40)
Bilirubin, Total: 0.4 mg/dL

## 2016-08-20 LAB — BASIC METABOLIC PANEL
BUN: 21 mg/dL (ref 4–21)
Creatinine: 1.2 mg/dL (ref 0.6–1.3)
GLUCOSE: 131 mg/dL
Potassium: 4.7 mmol/L (ref 3.4–5.3)
Sodium: 141 mmol/L (ref 137–147)

## 2016-08-20 LAB — HEMOGLOBIN A1C: HEMOGLOBIN A1C: 6.8

## 2016-08-22 ENCOUNTER — Other Ambulatory Visit: Payer: Self-pay | Admitting: Internal Medicine

## 2016-09-02 ENCOUNTER — Ambulatory Visit (INDEPENDENT_AMBULATORY_CARE_PROVIDER_SITE_OTHER): Payer: BLUE CROSS/BLUE SHIELD | Admitting: Internal Medicine

## 2016-09-02 ENCOUNTER — Encounter: Payer: Self-pay | Admitting: Internal Medicine

## 2016-09-02 DIAGNOSIS — Z6841 Body Mass Index (BMI) 40.0 and over, adult: Secondary | ICD-10-CM

## 2016-09-02 DIAGNOSIS — I10 Essential (primary) hypertension: Secondary | ICD-10-CM

## 2016-09-02 DIAGNOSIS — I872 Venous insufficiency (chronic) (peripheral): Secondary | ICD-10-CM

## 2016-09-02 DIAGNOSIS — E118 Type 2 diabetes mellitus with unspecified complications: Secondary | ICD-10-CM

## 2016-09-02 DIAGNOSIS — K219 Gastro-esophageal reflux disease without esophagitis: Secondary | ICD-10-CM

## 2016-09-02 DIAGNOSIS — R972 Elevated prostate specific antigen [PSA]: Secondary | ICD-10-CM

## 2016-09-02 NOTE — Patient Instructions (Addendum)
Wt loss center - Dr Quillian Quince  Shingrix vaccine

## 2016-09-02 NOTE — Assessment & Plan Note (Signed)
Wt loss should help 

## 2016-09-02 NOTE — Assessment & Plan Note (Signed)
  Lotensin HCT 

## 2016-09-02 NOTE — Assessment & Plan Note (Signed)
Linagl-metformin, Marcelline Deist

## 2016-09-02 NOTE — Assessment & Plan Note (Signed)
Wt loss w/Dr Dalbert Garnet advised

## 2016-09-02 NOTE — Assessment & Plan Note (Signed)
On Omeprazole 

## 2016-09-02 NOTE — Assessment & Plan Note (Signed)
F/u w/Dr Wrenn  

## 2016-09-02 NOTE — Progress Notes (Signed)
Subjective:  Patient ID: Andrew Carson, male    DOB: 06-16-1955  Age: 61 y.o. MRN: [id number]  CC: No chief complaint on file.   HPI Santhosh Ranft Milian presents for DM, HTN, GERD f/u   Outpatient Medications Prior to Visit  Medication Sig Dispense Refill  . benazepril-hydrochlorthiazide (LOTENSIN HCT) 20-12.5 MG tablet Take 2 tablets by mouth daily. 180 tablet 1  . [facility] D3 1000 units capsule TAKE ONE CAPSULE BY MOUTH EVERY DAY 90 capsule 3  . dapagliflozin propanediol (FARXIGA) 5 MG TABS tablet Take 5 mg by mouth daily. 30 tablet 11  . glucose blood test strip 1 each by Other route 2 (two) times daily. Use as instructed     . ketoconazole (NIZORAL) 2 % cream Apply 1 application topically 2 (two) times daily. 45 g 1  . Linagliptin-Metformin HCl (JENTADUETO) 2.08-998 MG TABS Take 1 tablet by mouth 2 (two) times daily. Yearly physical w/labs due in June must see MD for refills 180 tablet 0  . mometasone (ELOCON) 0.1 % cream APPLY TO AFFECTED AREA EVERY DAY 45 g 1  . naproxen (NAPROSYN) 500 MG tablet TAKE 1 TABLET BY MOUTH TWICE A DAY WITH A MEAL 180 tablet 0  . omega-3 acid ethyl esters (LOVAZA) 1 G capsule TAKE 1 CAPSULE (1,000 MG TOTAL) BY MOUTH 2 (TWO) TIMES DAILY.  3  . omeprazole (PRILOSEC) 20 MG capsule TAKE ONE CAPSULE BY MOUTH EVERY DAY 90 capsule 3  . aspirin EC 81 MG tablet Take 81 mg by mouth daily.     Facility-Administered Medications Prior to Visit  Medication Dose Route Frequency Provider Last Rate Last Dose  . 0.9 %  sodium chloride infusion  500 mL Intravenous Continuous Iva Boop, MD        ROS Review of Systems  Constitutional: Negative for appetite change, fatigue and unexpected weight change.  HENT: Negative for congestion, nosebleeds, sneezing, sore throat and trouble swallowing.   Eyes: Negative for itching and visual disturbance.  Respiratory: Negative for cough.   Cardiovascular: Negative for chest pain, palpitations and leg swelling.  Gastrointestinal:  Negative for abdominal distention, blood in stool, diarrhea and nausea.  Genitourinary: Negative for frequency and hematuria.  Musculoskeletal: Negative for back pain, gait problem, joint swelling and neck pain.  Skin: Negative for rash.  Neurological: Negative for dizziness, tremors, speech difficulty and weakness.  Psychiatric/Behavioral: Negative for agitation, dysphoric mood, sleep disturbance and suicidal ideas. The patient is not nervous/anxious.     Objective:  BP 118/76 (BP Location: Left Arm, Patient Position: Sitting, Cuff Size: Large)   Pulse 75   Temp 97.9 F (36.6 C) (Oral)   Ht 5\' 10"  (1.778 m)   Wt (!) 307 lb (139.3 kg)   SpO2 99%   BMI 44.05 kg/m   BP Readings from Last 3 Encounters:  09/02/16 118/76  06/03/16 114/72  01/29/16 140/90    Wt Readings from Last 3 Encounters:  09/02/16 (!) 307 lb (139.3 kg)  06/03/16 (!) 308 lb (139.7 kg)  01/29/16 (!) 309 lb (140.2 kg)    Physical Exam  Constitutional: He is oriented to person, place, and time. He appears well-developed. No distress.  NAD  HENT:  Mouth/Throat: Oropharynx is clear and moist.  Eyes: Conjunctivae are normal. Pupils are equal, round, and reactive to light.  Neck: Normal range of motion. No JVD present. No thyromegaly present.  Cardiovascular: Normal rate, regular rhythm, normal heart sounds and intact distal pulses.  Exam reveals no gallop and  no friction rub.   No murmur heard. Pulmonary/Chest: Effort normal and breath sounds normal. No respiratory distress. He has no wheezes. He has no rales. He exhibits no tenderness.  Abdominal: Soft. Bowel sounds are normal. He exhibits no distension and no mass. There is no tenderness. There is no rebound and no guarding.  Musculoskeletal: Normal range of motion. He exhibits no edema or tenderness.  Lymphadenopathy:    He has no cervical adenopathy.  Neurological: He is alert and oriented to person, place, and time. He has normal reflexes. No cranial nerve  deficit. He exhibits normal muscle tone. He displays a negative Romberg sign. Coordination and gait normal.  Skin: Skin is warm and dry. No rash noted.  Psychiatric: He has a normal mood and affect. His behavior is normal. Judgment and thought content normal.  Obese  Lab Results  Component Value Date   WBC 7.7 09/18/2015   HGB 13.7 09/18/2015   HCT 42.1 09/18/2015   PLT 260 09/18/2015   GLUCOSE 146 (H) 09/18/2015   CHOL 201 (H) 09/18/2015   TRIG 149 09/18/2015   HDL 33 (L) 09/18/2015   LDLDIRECT 149.9 03/12/2007   LDLCALC 138 (H) 09/18/2015   ALT 28 08/20/2016   AST 16 08/20/2016   NA 141 08/20/2016   K 4.7 08/20/2016   CL 105 09/18/2015   CREATININE 1.2 08/20/2016   BUN 21 08/20/2016   CO2 27 09/18/2015   TSH 2.13 09/18/2015   PSA 0.92 09/18/2015   HGBA1C 6.8 08/20/2016   MICROALBUR 0.5 09/18/2015    Dg Finger Ring Right  Result Date: 02/11/2011 *RADIOLOGY REPORT* Clinical Data: Right ring finger pain. RIGHT RING FINGER 2+V Comparison: None. Findings: Anatomic alignment.  No fracture.  Soft tissues appear within normal limits. No radiopaque foreign body. IMPRESSION: Negative. Original Report Authenticated By: Andreas Newport, M.D.   Assessment & Plan:   There are no diagnoses linked to this encounter. I am having Mr. Wainer maintain his glucose blood, aspirin EC, omega-3 acid ethyl esters, omeprazole, ketoconazole, mometasone, dapagliflozin propanediol, naproxen, benazepril-hydrochlorthiazide, Linagliptin-Metformin HCl, and [facility] D3. We will continue to administer sodium chloride.  No orders of the defined types were placed in this encounter.    Follow-up: No Follow-up on file.  Sonda Primes, MD

## 2016-09-12 ENCOUNTER — Other Ambulatory Visit: Payer: Self-pay | Admitting: Internal Medicine

## 2016-09-24 ENCOUNTER — Other Ambulatory Visit: Payer: Self-pay | Admitting: Internal Medicine

## 2016-11-06 ENCOUNTER — Other Ambulatory Visit: Payer: Self-pay | Admitting: Internal Medicine

## 2016-11-16 ENCOUNTER — Other Ambulatory Visit: Payer: Self-pay | Admitting: Internal Medicine

## 2016-12-11 ENCOUNTER — Other Ambulatory Visit: Payer: Self-pay | Admitting: Internal Medicine

## 2016-12-13 ENCOUNTER — Other Ambulatory Visit: Payer: Self-pay | Admitting: General Practice

## 2016-12-13 MED ORDER — NAPROXEN 500 MG PO TABS: ORAL_TABLET | ORAL | 0 refills | Status: DC

## 2016-12-19 ENCOUNTER — Encounter: Payer: Self-pay | Admitting: Internal Medicine

## 2016-12-20 LAB — CBC AND DIFFERENTIAL
HEMATOCRIT: 41 (ref 41–53)
HEMOGLOBIN: 13.2 — AB (ref 13.5–17.5)
Platelets: 299 (ref 150–399)
WBC: 7.2

## 2016-12-20 LAB — MICROALBUMIN, URINE: Microalb, Ur: 0.2

## 2016-12-20 LAB — LIPID PANEL
CHOLESTEROL: 200 (ref 0–200)
HDL: 32 — AB (ref 35–70)
LDL Cholesterol: 136
TRIGLYCERIDES: 187 — AB (ref 40–160)

## 2016-12-20 LAB — BASIC METABOLIC PANEL
BUN: 21 (ref 4–21)
Creatinine: 1.1 (ref 0.6–1.3)
Glucose: 132
POTASSIUM: 4.7 (ref 3.4–5.3)
Sodium: 141 (ref 137–147)

## 2016-12-20 LAB — TSH: TSH: 1.77 (ref 0.41–5.90)

## 2016-12-20 LAB — HEMOGLOBIN A1C: Hemoglobin A1C: 7.2

## 2016-12-20 LAB — PSA: PSA: 0.7

## 2016-12-30 ENCOUNTER — Ambulatory Visit (INDEPENDENT_AMBULATORY_CARE_PROVIDER_SITE_OTHER): Payer: BLUE CROSS/BLUE SHIELD | Admitting: Internal Medicine

## 2016-12-30 ENCOUNTER — Encounter: Payer: Self-pay | Admitting: Internal Medicine

## 2016-12-30 DIAGNOSIS — I10 Essential (primary) hypertension: Secondary | ICD-10-CM | POA: Diagnosis not present

## 2016-12-30 DIAGNOSIS — N471 Phimosis: Secondary | ICD-10-CM

## 2016-12-30 DIAGNOSIS — Z6841 Body Mass Index (BMI) 40.0 and over, adult: Secondary | ICD-10-CM | POA: Diagnosis not present

## 2016-12-30 DIAGNOSIS — E559 Vitamin D deficiency, unspecified: Secondary | ICD-10-CM | POA: Diagnosis not present

## 2016-12-30 DIAGNOSIS — E118 Type 2 diabetes mellitus with unspecified complications: Secondary | ICD-10-CM

## 2016-12-30 MED ORDER — DAPAGLIFLOZIN PROPANEDIOL 10 MG PO TABS: 10.0000 mg | ORAL_TABLET | Freq: Every day | ORAL | 3 refills | Status: DC

## 2016-12-30 NOTE — Assessment & Plan Note (Signed)
Vit D 

## 2016-12-30 NOTE — Assessment & Plan Note (Signed)
Wt Readings from Last 3 Encounters:  12/30/16 (!) 306 lb (138.8 kg)  09/02/16 (!) 307 lb (139.3 kg)  06/03/16 (!) 308 lb (139.7 kg)

## 2016-12-30 NOTE — Assessment & Plan Note (Signed)
  Lotensin HCT 

## 2016-12-30 NOTE — Assessment & Plan Note (Signed)
on Farxiga

## 2016-12-30 NOTE — Progress Notes (Signed)
Subjective:  Patient ID: Andrew Carson, male    DOB: January 27, 1956  Age: 61 y.o. MRN: [id number]  CC: No chief complaint on file.   HPI Andrew Carson presents for DM, HTN, dyslipidemia. Pt declined a flu shot  Outpatient Medications Prior to Visit  Medication Sig Dispense Refill  . benazepril-hydrochlorthiazide (LOTENSIN HCT) 20-12.5 MG tablet Take 2 tablets by mouth daily. 180 tablet 1  . [facility] D3 1000 units capsule TAKE ONE CAPSULE BY MOUTH EVERY DAY 90 capsule 3  . dapagliflozin propanediol (FARXIGA) 5 MG TABS tablet Take 5 mg by mouth daily. 30 tablet 11  . glucose blood test strip 1 each by Other route 2 (two) times daily. Use as instructed     . JENTADUETO 2.08-998 MG TABS TAKE 1 TABLET BY MOUTH TWICE A DAY 180 tablet 1  . ketoconazole (NIZORAL) 2 % cream Apply 1 application topically 2 (two) times daily. 45 g 1  . mometasone (ELOCON) 0.1 % cream APPLY TO AFFECTED AREA EVERY DAY 45 g 1  . naproxen (NAPROSYN) 500 MG tablet TAKE 1 TABLET BY MOUTH TWICE A DAY WITH A MEAL 180 tablet 0  . omega-3 acid ethyl esters (LOVAZA) 1 G capsule TAKE 1 CAPSULE (1,000 MG TOTAL) BY MOUTH 2 (TWO) TIMES DAILY.  3  . omeprazole (PRILOSEC) 20 MG capsule TAKE ONE CAPSULE BY MOUTH EVERY DAY 90 capsule 3  . aspirin EC 81 MG tablet Take 81 mg by mouth daily.    Marland Kitchen omega-3 acid ethyl esters (LOVAZA) 1 g capsule TAKE ONE CAPSULE BY MOUTH TWICE A DAY 180 capsule 2   Facility-Administered Medications Prior to Visit  Medication Dose Route Frequency Provider Last Rate Last Dose  . 0.9 %  sodium chloride infusion  500 mL Intravenous Continuous Iva Boop, MD        ROS Review of Systems  Constitutional: Negative for appetite change, fatigue and unexpected weight change.  HENT: Negative for congestion, nosebleeds, sneezing, sore throat and trouble swallowing.   Eyes: Negative for itching and visual disturbance.  Respiratory: Negative for cough.   Cardiovascular: Negative for chest pain, palpitations and  leg swelling.  Gastrointestinal: Negative for abdominal distention, blood in stool, diarrhea and nausea.  Genitourinary: Negative for frequency and hematuria.  Musculoskeletal: Negative for back pain, gait problem, joint swelling and neck pain.  Skin: Negative for rash.  Neurological: Negative for dizziness, tremors, speech difficulty and weakness.  Psychiatric/Behavioral: Negative for agitation, dysphoric mood and sleep disturbance. The patient is not nervous/anxious.     Objective:  BP 110/78 (BP Location: Left Arm, Patient Position: Sitting, Cuff Size: Large)   Pulse 71   Temp 97.9 F (36.6 C) (Oral)   Ht 5\' 10"  (1.778 m)   Wt (!) 306 lb (138.8 kg)   SpO2 99%   BMI 43.91 kg/m   BP Readings from Last 3 Encounters:  12/30/16 110/78  09/02/16 118/76  06/03/16 114/72    Wt Readings from Last 3 Encounters:  12/30/16 (!) 306 lb (138.8 kg)  09/02/16 (!) 307 lb (139.3 kg)  06/03/16 (!) 308 lb (139.7 kg)    Physical Exam  Constitutional: He is oriented to person, place, and time. He appears well-developed. No distress.  NAD  HENT:  Mouth/Throat: Oropharynx is clear and moist.  Eyes: Pupils are equal, round, and reactive to light. Conjunctivae are normal.  Neck: Normal range of motion. No JVD present. No thyromegaly present.  Cardiovascular: Normal rate, regular rhythm, normal heart sounds and intact  distal pulses.  Exam reveals no gallop and no friction rub.   No murmur heard. Pulmonary/Chest: Effort normal and breath sounds normal. No respiratory distress. He has no wheezes. He has no rales. He exhibits no tenderness.  Abdominal: Soft. Bowel sounds are normal. He exhibits no distension and no mass. There is no tenderness. There is no rebound and no guarding.  Musculoskeletal: Normal range of motion. He exhibits no edema or tenderness.  Lymphadenopathy:    He has no cervical adenopathy.  Neurological: He is alert and oriented to person, place, and time. He has normal reflexes.  No cranial nerve deficit. He exhibits normal muscle tone. He displays a negative Romberg sign. Coordination and gait normal.  Skin: Skin is warm and dry. Rash noted.  Psychiatric: He has a normal mood and affect. His behavior is normal. Judgment and thought content normal.  R shin w/chronic rash  Lab Results  Component Value Date   WBC 7.7 09/18/2015   HGB 13.7 09/18/2015   HCT 42.1 09/18/2015   PLT 260 09/18/2015   GLUCOSE 146 (H) 09/18/2015   CHOL 201 (H) 09/18/2015   TRIG 149 09/18/2015   HDL 33 (L) 09/18/2015   LDLDIRECT 149.9 03/12/2007   LDLCALC 138 (H) 09/18/2015   ALT 28 08/20/2016   AST 16 08/20/2016   NA 141 08/20/2016   K 4.7 08/20/2016   CL 105 09/18/2015   CREATININE 1.2 08/20/2016   BUN 21 08/20/2016   CO2 27 09/18/2015   TSH 2.13 09/18/2015   PSA 0.92 09/18/2015   HGBA1C 6.8 08/20/2016   MICROALBUR 0.5 09/18/2015    Dg Finger Ring Right  Result Date: 02/11/2011 *RADIOLOGY REPORT* Clinical Data: Right ring finger pain. RIGHT RING FINGER 2+V Comparison: None. Findings: Anatomic alignment.  No fracture.  Soft tissues appear within normal limits. No radiopaque foreign body. IMPRESSION: Negative. Original Report Authenticated By: Andreas Newport, M.D.   Assessment & Plan:   There are no diagnoses linked to this encounter. I am having Andrew Carson maintain his glucose blood, aspirin EC, omega-3 acid ethyl esters, ketoconazole, mometasone, dapagliflozin propanediol, benazepril-hydrochlorthiazide, [facility]S D3, JENTADUETO, omeprazole, and naproxen. We will continue to administer sodium chloride.  No orders of the defined types were placed in this encounter.    Follow-up: No Follow-up on file.  Sonda Primes, MD

## 2016-12-30 NOTE — Assessment & Plan Note (Signed)
candida due to [place] - tolerable

## 2017-01-06 ENCOUNTER — Other Ambulatory Visit: Payer: Self-pay | Admitting: Internal Medicine

## 2017-01-06 ENCOUNTER — Ambulatory Visit: Payer: BLUE CROSS/BLUE SHIELD | Admitting: Internal Medicine

## 2017-01-09 ENCOUNTER — Other Ambulatory Visit: Payer: Self-pay | Admitting: Internal Medicine

## 2017-02-13 ENCOUNTER — Other Ambulatory Visit: Payer: Self-pay | Admitting: Internal Medicine

## 2017-03-10 ENCOUNTER — Other Ambulatory Visit: Payer: Self-pay | Admitting: Internal Medicine

## 2017-04-14 ENCOUNTER — Encounter: Payer: Self-pay | Admitting: Internal Medicine

## 2017-04-21 ENCOUNTER — Ambulatory Visit: Payer: BLUE CROSS/BLUE SHIELD | Admitting: Internal Medicine

## 2017-04-21 ENCOUNTER — Encounter: Payer: Self-pay | Admitting: Internal Medicine

## 2017-04-21 DIAGNOSIS — I872 Venous insufficiency (chronic) (peripheral): Secondary | ICD-10-CM

## 2017-04-21 DIAGNOSIS — I10 Essential (primary) hypertension: Secondary | ICD-10-CM

## 2017-04-21 DIAGNOSIS — R972 Elevated prostate specific antigen [PSA]: Secondary | ICD-10-CM

## 2017-04-21 DIAGNOSIS — E118 Type 2 diabetes mellitus with unspecified complications: Secondary | ICD-10-CM

## 2017-04-21 NOTE — Progress Notes (Signed)
Subjective:  Patient ID: Andrew Carson, male    DOB: 02/23/1956  Age: 62 y.o. MRN: [id number]  CC: No chief complaint on file.   HPI CARRELL RAHMANI presents for HTN, DM, obesity f/u  Outpatient Medications Prior to Visit  Medication Sig Dispense Refill  . benazepril-hydrochlorthiazide (LOTENSIN HCT) 20-12.5 MG tablet TAKE 2 TABLETS BY MOUTH DAILY. 180 tablet 2  . [facility] D3 1000 units capsule TAKE ONE CAPSULE BY MOUTH EVERY DAY 90 capsule 3  . dapagliflozin propanediol (FARXIGA) 10 MG TABS tablet Take 10 mg by mouth daily. 90 tablet 3  . glucose blood test strip 1 each by Other route 2 (two) times daily. Use as instructed     . JENTADUETO 2.08-998 MG TABS TAKE 1 TABLET BY MOUTH TWICE A DAY 180 tablet 1  . mometasone (ELOCON) 0.1 % cream APPLY TO AFFECTED AREA EVERY DAY 45 g 1  . naproxen (NAPROSYN) 500 MG tablet TAKE 1 TABLET BY MOUTH TWICE A DAY WITH A MEAL 180 tablet 0  . omega-3 acid ethyl esters (LOVAZA) 1 G capsule TAKE 1 CAPSULE (1,000 MG TOTAL) BY MOUTH 2 (TWO) TIMES DAILY.  3  . omeprazole (PRILOSEC) 20 MG capsule TAKE ONE CAPSULE BY MOUTH EVERY DAY 90 capsule 3  . aspirin EC 81 MG tablet Take 81 mg by mouth daily.    Marland Kitchen FARXIGA 5 MG TABS tablet TAKE 1 TABLET BY MOUTH EVERY DAY 30 tablet 11   Facility-Administered Medications Prior to Visit  Medication Dose Route Frequency Provider Last Rate Last Dose  . 0.9 %  sodium chloride infusion  500 mL Intravenous Continuous Iva Boop, MD        ROS Review of Systems  Constitutional: Positive for unexpected weight change. Negative for appetite change and fatigue.  HENT: Negative for congestion, nosebleeds, sneezing, sore throat and trouble swallowing.   Eyes: Negative for itching and visual disturbance.  Respiratory: Negative for cough.   Cardiovascular: Negative for chest pain, palpitations and leg swelling.  Gastrointestinal: Negative for abdominal distention, blood in stool, diarrhea and nausea.  Genitourinary: Negative  for frequency and hematuria.  Musculoskeletal: Negative for back pain, gait problem, joint swelling and neck pain.  Skin: Negative for rash.  Neurological: Negative for dizziness, tremors, speech difficulty and weakness.  Psychiatric/Behavioral: Negative for agitation, dysphoric mood and sleep disturbance. The patient is not nervous/anxious.     Objective:  BP 124/82 (BP Location: Left Arm, Patient Position: Sitting, Cuff Size: Large)   Pulse 63   Temp 98.4 F (36.9 C) (Oral)   Ht 5\' 10"  (1.778 m)   Wt 295 lb (133.8 kg)   SpO2 100%   BMI 42.33 kg/m   BP Readings from Last 3 Encounters:  04/21/17 124/82  12/30/16 110/78  09/02/16 118/76    Wt Readings from Last 3 Encounters:  04/21/17 295 lb (133.8 kg)  12/30/16 (!) 306 lb (138.8 kg)  09/02/16 (!) 307 lb (139.3 kg)    Physical Exam  Constitutional: He is oriented to person, place, and time. He appears well-developed. No distress.  NAD  HENT:  Mouth/Throat: Oropharynx is clear and moist.  Eyes: Conjunctivae are normal. Pupils are equal, round, and reactive to light.  Neck: Normal range of motion. No JVD present. No thyromegaly present.  Cardiovascular: Normal rate, regular rhythm, normal heart sounds and intact distal pulses. Exam reveals no gallop and no friction rub.  No murmur heard. Pulmonary/Chest: Effort normal and breath sounds normal. No respiratory distress. He has  no wheezes. He has no rales. He exhibits no tenderness.  Abdominal: Soft. Bowel sounds are normal. He exhibits no distension and no mass. There is no tenderness. There is no rebound and no guarding.  Musculoskeletal: Normal range of motion. He exhibits no edema or tenderness.  Lymphadenopathy:    He has no cervical adenopathy.  Neurological: He is alert and oriented to person, place, and time. He has normal reflexes. No cranial nerve deficit. He exhibits normal muscle tone. He displays a negative Romberg sign. Coordination and gait normal.  Skin: Skin is  warm and dry. No rash noted.  Psychiatric: He has a normal mood and affect. His behavior is normal. Judgment and thought content normal.    Lab Results  Component Value Date   WBC 7.2 12/20/2016   HGB 13.2 (A) 12/20/2016   HCT 41 12/20/2016   PLT 299 12/20/2016   GLUCOSE 146 (H) 09/18/2015   CHOL 200 12/20/2016   TRIG 187 (A) 12/20/2016   HDL 32 (A) 12/20/2016   LDLDIRECT 149.9 03/12/2007   LDLCALC 136 12/20/2016   ALT 28 08/20/2016   AST 16 08/20/2016   NA 141 12/20/2016   K 4.7 12/20/2016   CL 105 09/18/2015   CREATININE 1.1 12/20/2016   BUN 21 12/20/2016   CO2 27 09/18/2015   TSH 1.77 12/20/2016   PSA 0.7 12/20/2016   HGBA1C 7.2 12/20/2016   MICROALBUR 0.2 12/20/2016    Dg Finger Ring Right  Result Date: 02/11/2011 *RADIOLOGY REPORT* Clinical Data: Right ring finger pain. RIGHT RING FINGER 2+V Comparison: None. Findings: Anatomic alignment.  No fracture.  Soft tissues appear within normal limits. No radiopaque foreign body. IMPRESSION: Negative. Original Report Authenticated By: Andreas Newport, M.D.   Assessment & Plan:   There are no diagnoses linked to this encounter. I have discontinued Vashti Hey. [employer] "Marty"'s FARXIGA. I am also having him maintain his glucose blood, aspirin EC, omega-3 acid ethyl esters, mometasone, CVS D3, JENTADUETO, omeprazole, dapagliflozin propanediol, benazepril-hydrochlorthiazide, and naproxen. We will continue to administer sodium chloride.  No orders of the defined types were placed in this encounter.    Follow-up: No Follow-up on file.  Sonda Primes, MD

## 2017-04-21 NOTE — Assessment & Plan Note (Signed)
  Lotensin HCT 

## 2017-04-21 NOTE — Assessment & Plan Note (Signed)
Better on Farxiga 10 mg/d

## 2017-04-21 NOTE — Assessment & Plan Note (Signed)
No change 

## 2017-04-21 NOTE — Assessment & Plan Note (Signed)
F/u w/Dr Wrenn  

## 2017-04-30 ENCOUNTER — Other Ambulatory Visit: Payer: Self-pay | Admitting: Internal Medicine

## 2017-05-11 ENCOUNTER — Telehealth: Payer: Self-pay

## 2017-05-11 NOTE — Telephone Encounter (Signed)
Rec'd fax from [facility], JENTADUETO 2.08-998 MG TABS no lover covered by insurance, alternative Janumet [phone]  Please advise

## 2017-05-12 NOTE — Telephone Encounter (Signed)
Janumet [id number]

## 2017-05-12 NOTE — Telephone Encounter (Signed)
What alternative is covered? Thank you

## 2017-06-05 ENCOUNTER — Other Ambulatory Visit: Payer: Self-pay | Admitting: Internal Medicine

## 2017-06-07 ENCOUNTER — Other Ambulatory Visit: Payer: Self-pay | Admitting: Internal Medicine

## 2017-06-07 NOTE — Telephone Encounter (Signed)
Routing to dr plotnikov, please advise, thanks 

## 2017-06-08 NOTE — Telephone Encounter (Signed)
See below about covered alternative

## 2017-06-10 MED ORDER — SITAGLIPTIN PHOS-METFORMIN HCL 50-1000 MG PO TABS: 1.0000 | ORAL_TABLET | Freq: Two times a day (BID) | ORAL | 11 refills | Status: DC

## 2017-06-10 NOTE — Telephone Encounter (Signed)
OK Janumet D/c Jentadueto thx

## 2017-06-10 NOTE — Addendum Note (Signed)
Addended by: Tresa Garter on: 06/10/2017 10:14 AM   Modules accepted: Orders

## 2017-06-11 ENCOUNTER — Other Ambulatory Visit: Payer: Self-pay | Admitting: Internal Medicine

## 2017-06-19 ENCOUNTER — Other Ambulatory Visit: Payer: Self-pay | Admitting: Internal Medicine

## 2017-07-28 ENCOUNTER — Other Ambulatory Visit: Payer: Self-pay | Admitting: Internal Medicine

## 2017-08-04 ENCOUNTER — Encounter: Payer: Self-pay | Admitting: Internal Medicine

## 2017-08-08 ENCOUNTER — Other Ambulatory Visit: Payer: Self-pay | Admitting: Internal Medicine

## 2017-08-11 ENCOUNTER — Encounter: Payer: Self-pay | Admitting: Internal Medicine

## 2017-08-11 ENCOUNTER — Ambulatory Visit (INDEPENDENT_AMBULATORY_CARE_PROVIDER_SITE_OTHER): Payer: BLUE CROSS/BLUE SHIELD | Admitting: Internal Medicine

## 2017-08-11 DIAGNOSIS — I10 Essential (primary) hypertension: Secondary | ICD-10-CM

## 2017-08-11 DIAGNOSIS — D649 Anemia, unspecified: Secondary | ICD-10-CM

## 2017-08-11 DIAGNOSIS — Z6841 Body Mass Index (BMI) 40.0 and over, adult: Secondary | ICD-10-CM

## 2017-08-11 DIAGNOSIS — I872 Venous insufficiency (chronic) (peripheral): Secondary | ICD-10-CM | POA: Diagnosis not present

## 2017-08-11 DIAGNOSIS — E118 Type 2 diabetes mellitus with unspecified complications: Secondary | ICD-10-CM

## 2017-08-11 HISTORY — DX: Anemia, unspecified: D64.9

## 2017-08-11 MED ORDER — MOMETASONE FUROATE 0.1 % EX CREA: TOPICAL_CREAM | CUTANEOUS | 3 refills | Status: DC

## 2017-08-11 NOTE — Assessment & Plan Note (Signed)
Elocon prn

## 2017-08-11 NOTE — Progress Notes (Signed)
Subjective:  Patient ID: Andrew Carson, male    DOB: [birth year]  Age: 62 y.o. MRN: [id number]  CC: No chief complaint on file.   HPI Andrew Carson presents for DM, HTN, GERD f/u  Hgb 12.9, MCV 73.8  Outpatient Medications Prior to Visit  Medication Sig Dispense Refill  . benazepril-hydrochlorthiazide (LOTENSIN HCT) 20-12.5 MG tablet TAKE 2 TABLETS BY MOUTH DAILY. 180 tablet 2  . [facility] D3 1000 units capsule TAKE ONE CAPSULE BY MOUTH EVERY DAY 90 capsule 3  . dapagliflozin propanediol (FARXIGA) 10 MG TABS tablet Take 10 mg by mouth daily. 90 tablet 3  . glucose blood test strip 1 each by Other route 2 (two) times daily. Use as instructed     . ketoconazole (NIZORAL) 2 % cream APPLY TOPICALLY TWICE A DAY 45 g 1  . mometasone (ELOCON) 0.1 % cream APPLY TO AFFECTED AREA EVERY DAY 45 g 1  . naproxen (NAPROSYN) 500 MG tablet TAKE 1 TABLET BY MOUTH TWICE A DAY WITH A MEAL 180 tablet 0  . omega-3 acid ethyl esters (LOVAZA) 1 g capsule TAKE 1 CAPSULE BY MOUTH TWICE A DAY 180 capsule 1  . omeprazole (PRILOSEC) 20 MG capsule TAKE ONE CAPSULE BY MOUTH EVERY DAY 90 capsule 3  . sitaGLIPtin-metformin (JANUMET) 50-1000 MG tablet Take 1 tablet by mouth 2 (two) times daily with a meal. 60 tablet 11  . aspirin EC 81 MG tablet Take 81 mg by mouth daily.    . JENTADUETO 2.08-998 MG TABS TAKE 1 TABLET BY MOUTH TWICE A DAY 180 tablet 3  . JENTADUETO 2.08-998 MG TABS TAKE 1 TABLET BY MOUTH TWICE A DAY 180 tablet 1  . omega-3 acid ethyl esters (LOVAZA) 1 G capsule TAKE 1 CAPSULE (1,000 MG TOTAL) BY MOUTH 2 (TWO) TIMES DAILY.  3   Facility-Administered Medications Prior to Visit  Medication Dose Route Frequency Provider Last Rate Last Dose  . 0.9 %  sodium chloride infusion  500 mL Intravenous Continuous Iva Boop, MD        ROS Review of Systems  Constitutional: Negative for appetite change, fatigue and unexpected weight change.  HENT: Negative for congestion, nosebleeds, sneezing, sore throat  and trouble swallowing.   Eyes: Negative for itching and visual disturbance.  Respiratory: Negative for cough.   Cardiovascular: Negative for chest pain, palpitations and leg swelling.  Gastrointestinal: Negative for abdominal distention, blood in stool, diarrhea and nausea.  Genitourinary: Negative for frequency and hematuria.  Musculoskeletal: Negative for back pain, gait problem, joint swelling and neck pain.  Skin: Positive for rash.  Neurological: Negative for dizziness, tremors, speech difficulty and weakness.  Psychiatric/Behavioral: Negative for agitation, dysphoric mood, sleep disturbance and suicidal ideas. The patient is not nervous/anxious.     Objective:  BP 128/82 (BP Location: Left Arm, Patient Position: Sitting, Cuff Size: Large)   Pulse 76   Temp 98.6 F (37 C) (Oral)   Ht 5\' 10"  (1.778 m)   Wt (!) 305 lb (138.3 kg)   SpO2 99%   BMI 43.76 kg/m   BP Readings from Last 3 Encounters:  08/11/17 128/82  04/21/17 124/82  12/30/16 110/78    Wt Readings from Last 3 Encounters:  08/11/17 (!) 305 lb (138.3 kg)  04/21/17 295 lb (133.8 kg)  12/30/16 (!) 306 lb (138.8 kg)    Physical Exam  Constitutional: He is oriented to person, place, and time. He appears well-developed. No distress.  NAD  HENT:  Mouth/Throat: Oropharynx is clear  and moist.  Eyes: Pupils are equal, round, and reactive to light. Conjunctivae are normal.  Neck: Normal range of motion. No JVD present. No thyromegaly present.  Cardiovascular: Normal rate, regular rhythm, normal heart sounds and intact distal pulses. Exam reveals no gallop and no friction rub.  No murmur heard. Pulmonary/Chest: Effort normal and breath sounds normal. No respiratory distress. He has no wheezes. He has no rales. He exhibits no tenderness.  Abdominal: Soft. Bowel sounds are normal. He exhibits no distension and no mass. There is no tenderness. There is no rebound and no guarding.  Musculoskeletal: Normal range of motion.  He exhibits edema. He exhibits no tenderness.  Lymphadenopathy:    He has no cervical adenopathy.  Neurological: He is alert and oriented to person, place, and time. He has normal reflexes. No cranial nerve deficit. He exhibits normal muscle tone. He displays a negative Romberg sign. Coordination and gait normal.  Skin: Skin is warm and dry. No rash noted.  Psychiatric: He has a normal mood and affect. His behavior is normal. Judgment and thought content normal.    Lab Results  Component Value Date   WBC 7.2 12/20/2016   HGB 13.2 (A) 12/20/2016   HCT 41 12/20/2016   PLT 299 12/20/2016   GLUCOSE 146 (H) 09/18/2015   CHOL 200 12/20/2016   TRIG 187 (A) 12/20/2016   HDL 32 (A) 12/20/2016   LDLDIRECT 149.9 03/12/2007   LDLCALC 136 12/20/2016   ALT 28 08/20/2016   AST 16 08/20/2016   NA 141 12/20/2016   K 4.7 12/20/2016   CL 105 09/18/2015   CREATININE 1.1 12/20/2016   BUN 21 12/20/2016   CO2 27 09/18/2015   TSH 1.77 12/20/2016   PSA 0.7 12/20/2016   HGBA1C 7.2 12/20/2016   MICROALBUR 0.2 12/20/2016    Dg Finger Ring Right  Result Date: 02/11/2011 *RADIOLOGY REPORT* Clinical Data: Right ring finger pain. RIGHT RING FINGER 2+V Comparison: None. Findings: Anatomic alignment.  No fracture.  Soft tissues appear within normal limits. No radiopaque foreign body. IMPRESSION: Negative. Original Report Authenticated By: Andreas Newport, M.D.   Assessment & Plan:   There are no diagnoses linked to this encounter. I have discontinued Andrew Hey. [place] "Marty"'s JENTADUETO and JENTADUETO. I am also having him maintain his glucose blood, aspirin EC, mometasone, CVS D3, omeprazole, dapagliflozin propanediol, benazepril-hydrochlorthiazide, naproxen, sitaGLIPtin-metformin, omega-3 acid ethyl esters, and ketoconazole. We will continue to administer sodium chloride.  No orders of the defined types were placed in this encounter.    Follow-up: No follow-ups on file.  Sonda Primes, MD

## 2017-08-11 NOTE — Assessment & Plan Note (Signed)
  Lotensin HCT 

## 2017-08-11 NOTE — Assessment & Plan Note (Signed)
Wt Readings from Last 3 Encounters:  08/11/17 (!) 305 lb (138.3 kg)  04/21/17 295 lb (133.8 kg)  12/30/16 (!) 306 lb (138.8 kg)

## 2017-08-11 NOTE — Assessment & Plan Note (Signed)
Linagl-metformin Marcelline Deist

## 2017-08-30 ENCOUNTER — Other Ambulatory Visit: Payer: Self-pay | Admitting: Internal Medicine

## 2017-09-02 ENCOUNTER — Other Ambulatory Visit: Payer: Self-pay | Admitting: Internal Medicine

## 2017-11-03 ENCOUNTER — Other Ambulatory Visit: Payer: Self-pay | Admitting: Internal Medicine

## 2017-11-10 ENCOUNTER — Telehealth: Payer: Self-pay | Admitting: Internal Medicine

## 2017-11-10 NOTE — Telephone Encounter (Signed)
Routed to wrong practice.  

## 2017-11-10 NOTE — Telephone Encounter (Signed)
Copied from CRM [id number]. Topic: General - Other >> Nov 10, 2017  1:08 PM Tamela Oddi wrote: Reason for CRM: Raiann with [facility] called to inform doctor that they received orders with no diagnostics codes listed.  Please advise.  CB# [phone].

## 2017-11-11 LAB — HEMOGLOBIN A1C: HEMOGLOBIN A1C: 6.7

## 2017-11-11 LAB — CBC AND DIFFERENTIAL
HCT: 40 — AB (ref 41–53)
Hemoglobin: 13.2 — AB (ref 13.5–17.5)
PLATELETS: 293 (ref 150–399)
WBC: 7.5

## 2017-11-11 LAB — IRON,TIBC AND FERRITIN PANEL
%SAT: 11
Iron: 39
TIBC: 350

## 2017-11-11 LAB — BASIC METABOLIC PANEL
BUN: 20 (ref 4–21)
CREATININE: 1.2 (ref 0.6–1.3)
GLUCOSE: 115
Potassium: 4.2 (ref 3.4–5.3)
Sodium: 139 (ref 137–147)

## 2017-11-11 LAB — HEPATIC FUNCTION PANEL
ALT: 29 (ref 10–40)
AST: 15 (ref 14–40)
Alkaline Phosphatase: 45 (ref 25–125)
BILIRUBIN, TOTAL: 0.6
Bilirubin, Direct: 0.1 (ref 0.01–0.4)

## 2017-11-14 NOTE — Telephone Encounter (Signed)
LMTCB, DX codes are E11.9, I10

## 2017-11-17 ENCOUNTER — Encounter: Payer: Self-pay | Admitting: Internal Medicine

## 2017-11-17 ENCOUNTER — Ambulatory Visit: Payer: BLUE CROSS/BLUE SHIELD | Admitting: Internal Medicine

## 2017-11-17 DIAGNOSIS — K219 Gastro-esophageal reflux disease without esophagitis: Secondary | ICD-10-CM

## 2017-11-17 DIAGNOSIS — I10 Essential (primary) hypertension: Secondary | ICD-10-CM

## 2017-11-17 DIAGNOSIS — E118 Type 2 diabetes mellitus with unspecified complications: Secondary | ICD-10-CM | POA: Diagnosis not present

## 2017-11-17 DIAGNOSIS — D649 Anemia, unspecified: Secondary | ICD-10-CM

## 2017-11-17 DIAGNOSIS — E559 Vitamin D deficiency, unspecified: Secondary | ICD-10-CM

## 2017-11-17 DIAGNOSIS — R972 Elevated prostate specific antigen [PSA]: Secondary | ICD-10-CM | POA: Diagnosis not present

## 2017-11-17 MED ORDER — FERROUS SULFATE 325 (65 FE) MG PO TABS: 325.0000 mg | ORAL_TABLET | Freq: Every day | ORAL | 1 refills | Status: DC

## 2017-11-17 NOTE — Assessment & Plan Note (Signed)
Dr Wrenn 

## 2017-11-17 NOTE — Assessment & Plan Note (Signed)
No sx's on Omeprazole

## 2017-11-17 NOTE — Assessment & Plan Note (Addendum)
Better - cont Marcelline Deist

## 2017-11-17 NOTE — Progress Notes (Signed)
Subjective:  Patient ID: Andrew Carson, male    DOB: 07-16-1955  Age: 62 y.o. MRN: [id number]  CC: No chief complaint on file.   HPI Andrew Carson presents for anemia, HTN, DM   Outpatient Medications Prior to Visit  Medication Sig Dispense Refill  . benazepril-hydrochlorthiazide (LOTENSIN HCT) 20-12.5 MG tablet TAKE 2 TABLETS BY MOUTH DAILY. 180 tablet 2  . [facility]S D3 1000 units capsule TAKE 1 CAPSULE BY MOUTH EVERY DAY 90 capsule 3  . dapagliflozin propanediol (FARXIGA) 10 MG TABS tablet Take 10 mg by mouth daily. 90 tablet 3  . glucose blood test strip 1 each by Other route 2 (two) times daily. Use as instructed     . ketoconazole (NIZORAL) 2 % cream APPLY TOPICALLY TWICE A DAY 45 g 1  . mometasone (ELOCON) 0.1 % cream APPLY TO AFFECTED AREA EVERY DAY 45 g 3  . naproxen (NAPROSYN) 500 MG tablet TAKE 1 TABLET BY MOUTH TWICE A DAY WITH A MEAL 180 tablet 0  . omega-3 acid ethyl esters (LOVAZA) 1 g capsule TAKE 1 CAPSULE BY MOUTH TWICE A DAY 180 capsule 1  . omeprazole (PRILOSEC) 20 MG capsule TAKE 1 CAPSULE BY MOUTH EVERY DAY 90 capsule 3  . sitaGLIPtin-metformin (JANUMET) 50-1000 MG tablet Take 1 tablet by mouth 2 (two) times daily with a meal. 60 tablet 11  . aspirin EC 81 MG tablet Take 81 mg by mouth daily.     Facility-Administered Medications Prior to Visit  Medication Dose Route Frequency Provider Last Rate Last Dose  . 0.9 %  sodium chloride infusion  500 mL Intravenous Continuous Iva Boop, MD        ROS: Review of Systems  Constitutional: Negative for appetite change, fatigue and unexpected weight change.  HENT: Negative for congestion, nosebleeds, sneezing, sore throat and trouble swallowing.   Eyes: Negative for itching and visual disturbance.  Respiratory: Negative for cough.   Cardiovascular: Negative for chest pain, palpitations and leg swelling.  Gastrointestinal: Negative for abdominal distention, blood in stool, diarrhea and nausea.  Genitourinary:  Negative for frequency and hematuria.  Musculoskeletal: Negative for back pain, gait problem, joint swelling and neck pain.  Skin: Negative for rash.  Neurological: Negative for dizziness, tremors, speech difficulty and weakness.  Psychiatric/Behavioral: Negative for agitation, dysphoric mood, sleep disturbance and suicidal ideas. The patient is not nervous/anxious.     Objective:  BP 118/70 (BP Location: Left Arm, Patient Position: Sitting, Cuff Size: Large)   Pulse 79   Ht 5\' 10"  (1.778 m)   Wt 293 lb (132.9 kg)   SpO2 95%   BMI 42.04 kg/m   BP Readings from Last 3 Encounters:  11/17/17 118/70  08/11/17 128/82  04/21/17 124/82    Wt Readings from Last 3 Encounters:  11/17/17 293 lb (132.9 kg)  08/11/17 (!) 305 lb (138.3 kg)  04/21/17 295 lb (133.8 kg)    Physical Exam  Constitutional: He is oriented to person, place, and time. He appears well-developed. No distress.  NAD  HENT:  Mouth/Throat: Oropharynx is clear and moist.  Eyes: Pupils are equal, round, and reactive to light. Conjunctivae are normal.  Neck: Normal range of motion. No JVD present. No thyromegaly present.  Cardiovascular: Normal rate, regular rhythm, normal heart sounds and intact distal pulses. Exam reveals no gallop and no friction rub.  No murmur heard. Pulmonary/Chest: Effort normal and breath sounds normal. No respiratory distress. He has no wheezes. He has no rales. He exhibits no  tenderness.  Abdominal: Soft. Bowel sounds are normal. He exhibits no distension and no mass. There is no tenderness. There is no rebound and no guarding.  Musculoskeletal: Normal range of motion. He exhibits no edema or tenderness.  Lymphadenopathy:    He has no cervical adenopathy.  Neurological: He is alert and oriented to person, place, and time. He has normal reflexes. No cranial nerve deficit. He exhibits normal muscle tone. He displays a negative Romberg sign. Coordination and gait normal.  Skin: Skin is warm and  dry. No rash noted.  Psychiatric: He has a normal mood and affect. His behavior is normal. Judgment and thought content normal.    Lab Results  Component Value Date   WBC 7.2 12/20/2016   HGB 13.2 (A) 12/20/2016   HCT 41 12/20/2016   PLT 299 12/20/2016   GLUCOSE 146 (H) 09/18/2015   CHOL 200 12/20/2016   TRIG 187 (A) 12/20/2016   HDL 32 (A) 12/20/2016   LDLDIRECT 149.9 03/12/2007   LDLCALC 136 12/20/2016   ALT 28 08/20/2016   AST 16 08/20/2016   NA 141 12/20/2016   K 4.7 12/20/2016   CL 105 09/18/2015   CREATININE 1.1 12/20/2016   BUN 21 12/20/2016   CO2 27 09/18/2015   TSH 1.77 12/20/2016   PSA 0.7 12/20/2016   HGBA1C 7.2 12/20/2016   MICROALBUR 0.2 12/20/2016    Dg Finger Ring Right  Result Date: 02/11/2011 *RADIOLOGY REPORT* Clinical Data: Right ring finger pain. RIGHT RING FINGER 2+V Comparison: None. Findings: Anatomic alignment.  No fracture.  Soft tissues appear within normal limits. No radiopaque foreign body. IMPRESSION: Negative. Original Report Authenticated By: Andreas Newport, M.D.   Assessment & Plan:   There are no diagnoses linked to this encounter.   No orders of the defined types were placed in this encounter.    Follow-up: No follow-ups on file.  Sonda Primes, MD

## 2017-11-17 NOTE — Assessment & Plan Note (Signed)
  Lotensin HCT 

## 2017-11-17 NOTE — Assessment & Plan Note (Addendum)
Colon  2017 Dr Leone Payor Start iron GI ref Hgb not better

## 2017-11-17 NOTE — Assessment & Plan Note (Signed)
Vit d 

## 2017-11-18 ENCOUNTER — Other Ambulatory Visit: Payer: Self-pay | Admitting: Internal Medicine

## 2017-12-01 ENCOUNTER — Other Ambulatory Visit: Payer: Self-pay | Admitting: Internal Medicine

## 2017-12-13 ENCOUNTER — Other Ambulatory Visit: Payer: Self-pay | Admitting: Internal Medicine

## 2018-01-18 ENCOUNTER — Other Ambulatory Visit: Payer: Self-pay | Admitting: Internal Medicine

## 2018-02-12 ENCOUNTER — Encounter: Payer: Self-pay | Admitting: Internal Medicine

## 2018-02-12 ENCOUNTER — Ambulatory Visit (INDEPENDENT_AMBULATORY_CARE_PROVIDER_SITE_OTHER): Payer: BLUE CROSS/BLUE SHIELD | Admitting: Internal Medicine

## 2018-02-12 DIAGNOSIS — E119 Type 2 diabetes mellitus without complications: Secondary | ICD-10-CM | POA: Diagnosis not present

## 2018-02-12 DIAGNOSIS — I10 Essential (primary) hypertension: Secondary | ICD-10-CM | POA: Diagnosis not present

## 2018-02-12 DIAGNOSIS — Z6841 Body Mass Index (BMI) 40.0 and over, adult: Secondary | ICD-10-CM

## 2018-02-12 DIAGNOSIS — D649 Anemia, unspecified: Secondary | ICD-10-CM | POA: Diagnosis not present

## 2018-02-12 DIAGNOSIS — E785 Hyperlipidemia, unspecified: Secondary | ICD-10-CM

## 2018-02-12 DIAGNOSIS — M25562 Pain in left knee: Secondary | ICD-10-CM

## 2018-02-12 NOTE — Assessment & Plan Note (Signed)
  Lotensin HCT 

## 2018-02-12 NOTE — Assessment & Plan Note (Addendum)
Cardiac CT scoring info given

## 2018-02-12 NOTE — Assessment & Plan Note (Signed)
Hgb is better

## 2018-02-12 NOTE — Assessment & Plan Note (Signed)
Wt Readings from Last 3 Encounters:  02/12/18 293 lb (132.9 kg)  11/17/17 293 lb (132.9 kg)  08/11/17 (!) 305 lb (138.3 kg)

## 2018-02-12 NOTE — Patient Instructions (Signed)

## 2018-02-12 NOTE — Assessment & Plan Note (Addendum)
Sports med ref offered The pt will go to [facility]

## 2018-02-12 NOTE — Assessment & Plan Note (Signed)
Doing well 

## 2018-02-12 NOTE — Progress Notes (Signed)
Subjective:  Patient ID: Andrew Carson, male    DOB: Jan 06, 1956  Age: 62 y.o. MRN: [id number]  CC: No chief complaint on file.   HPI Andrew Carson presents for DM, HTN C/o L knee pain  - used Naproxen before. Worse going down steps x 2 mo  Outpatient Medications Prior to Visit  Medication Sig Dispense Refill  . benazepril-hydrochlorthiazide (LOTENSIN HCT) 20-12.5 MG tablet TAKE 2 TABLETS BY MOUTH EVERY DAY 180 tablet 3  . [facility]S D3 1000 units capsule TAKE 1 CAPSULE BY MOUTH EVERY DAY 90 capsule 3  . FARXIGA 10 MG TABS tablet TAKE 1 TABLET BY MOUTH EVERY DAY 90 tablet 3  . ferrous sulfate 325 (65 FE) MG tablet Take 1 tablet (325 mg total) by mouth daily. 90 tablet 1  . glucose blood test strip 1 each by Other route 2 (two) times daily. Use as instructed     . ketoconazole (NIZORAL) 2 % cream APPLY TOPICALLY TWICE A DAY 45 g 1  . mometasone (ELOCON) 0.1 % cream APPLY TO AFFECTED AREA EVERY DAY 45 g 3  . naproxen (NAPROSYN) 500 MG tablet TAKE 1 TABLET BY MOUTH TWICE A DAY WITH A MEAL 180 tablet 0  . omega-3 acid ethyl esters (LOVAZA) 1 g capsule TAKE 1 CAPSULE BY MOUTH TWICE A DAY 180 capsule 1  . omeprazole (PRILOSEC) 20 MG capsule TAKE 1 CAPSULE BY MOUTH EVERY DAY 90 capsule 3  . aspirin EC 81 MG tablet Take 81 mg by mouth daily.     Facility-Administered Medications Prior to Visit  Medication Dose Route Frequency Provider Last Rate Last Dose  . 0.9 %  sodium chloride infusion  500 mL Intravenous Continuous Iva Boop, MD        ROS: Review of Systems  Constitutional: Negative for appetite change, fatigue and unexpected weight change.  HENT: Negative for congestion, nosebleeds, sneezing, sore throat and trouble swallowing.   Eyes: Negative for itching and visual disturbance.  Respiratory: Negative for cough.   Cardiovascular: Negative for chest pain, palpitations and leg swelling.  Gastrointestinal: Negative for abdominal distention, blood in stool, diarrhea and nausea.    Genitourinary: Negative for frequency and hematuria.  Musculoskeletal: Positive for arthralgias and gait problem. Negative for back pain, joint swelling and neck pain.  Skin: Negative for rash.  Neurological: Negative for dizziness, tremors, speech difficulty and weakness.  Psychiatric/Behavioral: Negative for agitation, dysphoric mood and sleep disturbance. The patient is not nervous/anxious.     Objective:  BP 120/78 (BP Location: Left Arm, Patient Position: Sitting, Cuff Size: Large)   Pulse 69   Temp 98.2 F (36.8 C) (Oral)   Ht 5\' 10"  (1.778 m)   Wt 293 lb (132.9 kg)   SpO2 97%   BMI 42.04 kg/m   BP Readings from Last 3 Encounters:  02/12/18 120/78  11/17/17 118/70  08/11/17 128/82    Wt Readings from Last 3 Encounters:  02/12/18 293 lb (132.9 kg)  11/17/17 293 lb (132.9 kg)  08/11/17 (!) 305 lb (138.3 kg)    Physical Exam  Constitutional: He is oriented to person, place, and time. He appears well-developed. No distress.  NAD  HENT:  Mouth/Throat: Oropharynx is clear and moist.  Eyes: Pupils are equal, round, and reactive to light. Conjunctivae are normal.  Neck: Normal range of motion. No JVD present. No thyromegaly present.  Cardiovascular: Normal rate, regular rhythm, normal heart sounds and intact distal pulses. Exam reveals no gallop and no friction rub.  No murmur heard. Pulmonary/Chest: Effort normal and breath sounds normal. No respiratory distress. He has no wheezes. He has no rales. He exhibits no tenderness.  Abdominal: Soft. Bowel sounds are normal. He exhibits no distension and no mass. There is no tenderness. There is no rebound and no guarding.  Musculoskeletal: Normal range of motion. He exhibits no edema or tenderness.  Lymphadenopathy:    He has no cervical adenopathy.  Neurological: He is alert and oriented to person, place, and time. He has normal reflexes. No cranial nerve deficit. He exhibits normal muscle tone. He displays a negative Romberg  sign. Coordination and gait normal.  Skin: Skin is warm and dry. No rash noted.  Psychiatric: He has a normal mood and affect. His behavior is normal. Judgment and thought content normal.  L knee - tender w/ROM  Lab Results  Component Value Date   WBC 7.5 11/11/2017   HGB 13.2 (A) 11/11/2017   HCT 40 (A) 11/11/2017   PLT 293 11/11/2017   GLUCOSE 146 (H) 09/18/2015   CHOL 200 12/20/2016   TRIG 187 (A) 12/20/2016   HDL 32 (A) 12/20/2016   LDLDIRECT 149.9 03/12/2007   LDLCALC 136 12/20/2016   ALT 29 11/11/2017   AST 15 11/11/2017   NA 139 11/10/2017   K 4.2 11/10/2017   CL 105 09/18/2015   CREATININE 1.2 11/10/2017   BUN 20 11/10/2017   CO2 27 09/18/2015   TSH 1.77 12/20/2016   PSA 0.7 12/20/2016   HGBA1C 6.7 11/11/2017   MICROALBUR 0.2 12/20/2016    Dg Finger Ring Right  Result Date: 02/11/2011 *RADIOLOGY REPORT* Clinical Data: Right ring finger pain. RIGHT RING FINGER 2+V Comparison: None. Findings: Anatomic alignment.  No fracture.  Soft tissues appear within normal limits. No radiopaque foreign body. IMPRESSION: Negative. Original Report Authenticated By: Andreas Newport, M.D.   Assessment & Plan:   There are no diagnoses linked to this encounter.   No orders of the defined types were placed in this encounter.    Follow-up: No follow-ups on file.  Sonda Primes, MD

## 2018-02-25 ENCOUNTER — Other Ambulatory Visit: Payer: Self-pay | Admitting: Internal Medicine

## 2018-02-28 ENCOUNTER — Other Ambulatory Visit: Payer: Self-pay | Admitting: Internal Medicine

## 2018-03-20 ENCOUNTER — Other Ambulatory Visit: Payer: Self-pay | Admitting: Internal Medicine

## 2018-05-14 ENCOUNTER — Encounter: Payer: Self-pay | Admitting: Internal Medicine

## 2018-05-14 ENCOUNTER — Ambulatory Visit: Payer: BLUE CROSS/BLUE SHIELD | Admitting: Internal Medicine

## 2018-05-14 DIAGNOSIS — I10 Essential (primary) hypertension: Secondary | ICD-10-CM | POA: Diagnosis not present

## 2018-05-14 DIAGNOSIS — E785 Hyperlipidemia, unspecified: Secondary | ICD-10-CM | POA: Diagnosis not present

## 2018-05-14 DIAGNOSIS — R972 Elevated prostate specific antigen [PSA]: Secondary | ICD-10-CM

## 2018-05-14 DIAGNOSIS — E119 Type 2 diabetes mellitus without complications: Secondary | ICD-10-CM

## 2018-05-14 NOTE — Assessment & Plan Note (Signed)
Cardiac CT scoring test offered 11/19 

## 2018-05-14 NOTE — Assessment & Plan Note (Signed)
PSA

## 2018-05-14 NOTE — Progress Notes (Signed)
Subjective:  Patient ID: Andrew Carson, male    DOB: [birth year]  Age: 63 y.o. MRN: [id number]  CC: No chief complaint on file.   HPI Andrew Carson presents for DM, HTN, obesity f/u  Outpatient Medications Prior to Visit  Medication Sig Dispense Refill  . benazepril-hydrochlorthiazide (LOTENSIN HCT) 20-12.5 MG tablet TAKE 2 TABLETS BY MOUTH EVERY DAY 180 tablet 3  . [facility]S D3 1000 units capsule TAKE 1 CAPSULE BY MOUTH EVERY DAY 90 capsule 3  . FARXIGA 10 MG TABS tablet TAKE 1 TABLET BY MOUTH EVERY DAY 90 tablet 3  . ferrous sulfate 325 (65 FE) MG tablet Take 1 tablet (325 mg total) by mouth daily. 90 tablet 1  . glucose blood test strip 1 each by Other route 2 (two) times daily. Use as instructed     . ketoconazole (NIZORAL) 2 % cream APPLY TOPICALLY TWICE A DAY 45 g 1  . mometasone (ELOCON) 0.1 % cream APPLY TO AFFECTED AREA EVERY DAY 45 g 3  . naproxen (NAPROSYN) 500 MG tablet TAKE 1 TABLET BY MOUTH TWICE A DAY WITH MEALS 180 tablet 0  . omega-3 acid ethyl esters (LOVAZA) 1 g capsule TAKE 1 CAPSULE BY MOUTH TWICE A DAY 180 capsule 1  . omeprazole (PRILOSEC) 20 MG capsule TAKE 1 CAPSULE BY MOUTH EVERY DAY 90 capsule 3  . sitaGLIPtin-metformin (JANUMET) 50-1000 MG tablet Take 1 tablet by mouth 2 (two) times daily with a meal. Follow-up appt due in Feb must see provider for future refills 180 tablet 0  . aspirin EC 81 MG tablet Take 81 mg by mouth daily.    . naproxen (NAPROSYN) 500 MG tablet TAKE 1 TABLET BY MOUTH TWICE A DAY WITH MEALS 180 tablet 0   Facility-Administered Medications Prior to Visit  Medication Dose Route Frequency Provider Last Rate Last Dose  . 0.9 %  sodium chloride infusion  500 mL Intravenous Continuous Iva Boop, MD        ROS: Review of Systems  Constitutional: Positive for unexpected weight change. Negative for appetite change and fatigue.  HENT: Negative for congestion, nosebleeds, sneezing, sore throat and trouble swallowing.   Eyes: Negative for  itching and visual disturbance.  Respiratory: Negative for cough.   Cardiovascular: Negative for chest pain, palpitations and leg swelling.  Gastrointestinal: Negative for abdominal distention, blood in stool, diarrhea and nausea.  Genitourinary: Negative for frequency and hematuria.  Musculoskeletal: Negative for back pain, gait problem, joint swelling and neck pain.  Skin: Negative for rash.  Neurological: Negative for dizziness, tremors, speech difficulty and weakness.  Psychiatric/Behavioral: Negative for agitation, dysphoric mood and sleep disturbance. The patient is not nervous/anxious.     Objective:  BP 124/76 (BP Location: Left Arm, Patient Position: Sitting, Cuff Size: Large)   Pulse 79   Temp 97.6 F (36.4 C) (Oral)   Ht 5\' 10"  (1.778 m)   Wt 298 lb (135.2 kg)   SpO2 98%   BMI 42.76 kg/m   BP Readings from Last 3 Encounters:  05/14/18 124/76  02/12/18 120/78  11/17/17 118/70    Wt Readings from Last 3 Encounters:  05/14/18 298 lb (135.2 kg)  02/12/18 293 lb (132.9 kg)  11/17/17 293 lb (132.9 kg)    Physical Exam Constitutional:      General: He is not in acute distress.    Appearance: He is well-developed.     Comments: NAD  Eyes:     Conjunctiva/sclera: Conjunctivae normal.  Pupils: Pupils are equal, round, and reactive to light.  Neck:     Musculoskeletal: Normal range of motion.     Thyroid: No thyromegaly.     Vascular: No JVD.  Cardiovascular:     Rate and Rhythm: Normal rate and regular rhythm.     Heart sounds: Normal heart sounds. No murmur. No friction rub. No gallop.   Pulmonary:     Effort: Pulmonary effort is normal. No respiratory distress.     Breath sounds: Normal breath sounds. No wheezing or rales.  Chest:     Chest wall: No tenderness.  Abdominal:     General: Bowel sounds are normal. There is no distension.     Palpations: Abdomen is soft. There is no mass.     Tenderness: There is no abdominal tenderness. There is no guarding  or rebound.  Musculoskeletal: Normal range of motion.        General: No tenderness.  Lymphadenopathy:     Cervical: No cervical adenopathy.  Skin:    General: Skin is warm and dry.     Findings: No rash.  Neurological:     Mental Status: He is alert and oriented to person, place, and time.     Cranial Nerves: No cranial nerve deficit.     Motor: No abnormal muscle tone.     Coordination: Coordination normal.     Gait: Gait normal.     Deep Tendon Reflexes: Reflexes are normal and symmetric.  Psychiatric:        Behavior: Behavior normal.        Thought Content: Thought content normal.        Judgment: Judgment normal.     Lab Results  Component Value Date   WBC 7.5 11/11/2017   HGB 13.2 (A) 11/11/2017   HCT 40 (A) 11/11/2017   PLT 293 11/11/2017   GLUCOSE 146 (H) 09/18/2015   CHOL 200 12/20/2016   TRIG 187 (A) 12/20/2016   HDL 32 (A) 12/20/2016   LDLDIRECT 149.9 03/12/2007   LDLCALC 136 12/20/2016   ALT 29 11/11/2017   AST 15 11/11/2017   NA 139 11/10/2017   K 4.2 11/10/2017   CL 105 09/18/2015   CREATININE 1.2 11/10/2017   BUN 20 11/10/2017   CO2 27 09/18/2015   TSH 1.77 12/20/2016   PSA 0.7 12/20/2016   HGBA1C 6.7 11/11/2017   MICROALBUR 0.2 12/20/2016    Dg Finger Ring Right  Result Date: 02/11/2011 *RADIOLOGY REPORT* Clinical Data: Right ring finger pain. RIGHT RING FINGER 2+V Comparison: None. Findings: Anatomic alignment.  No fracture.  Soft tissues appear within normal limits. No radiopaque foreign body. IMPRESSION: Negative. Original Report Authenticated By: Andreas Newport, M.D.   Assessment & Plan:   There are no diagnoses linked to this encounter.   No orders of the defined types were placed in this encounter.    Follow-up: No follow-ups on file.  Sonda Primes, MD

## 2018-05-14 NOTE — Patient Instructions (Signed)

## 2018-05-14 NOTE — Assessment & Plan Note (Signed)
Cardiac CT scoring test offered 11/19

## 2018-06-07 ENCOUNTER — Other Ambulatory Visit: Payer: Self-pay | Admitting: Internal Medicine

## 2018-06-28 ENCOUNTER — Other Ambulatory Visit: Payer: Self-pay | Admitting: Internal Medicine

## 2018-07-19 ENCOUNTER — Other Ambulatory Visit: Payer: Self-pay | Admitting: Internal Medicine

## 2018-08-07 ENCOUNTER — Ambulatory Visit: Payer: BLUE CROSS/BLUE SHIELD | Admitting: Internal Medicine

## 2018-08-07 ENCOUNTER — Encounter: Payer: Self-pay | Admitting: Internal Medicine

## 2018-08-07 DIAGNOSIS — I2583 Coronary atherosclerosis due to lipid rich plaque: Secondary | ICD-10-CM

## 2018-08-07 DIAGNOSIS — I2510 Atherosclerotic heart disease of native coronary artery without angina pectoris: Secondary | ICD-10-CM | POA: Insufficient documentation

## 2018-08-07 DIAGNOSIS — E785 Hyperlipidemia, unspecified: Secondary | ICD-10-CM

## 2018-08-07 DIAGNOSIS — I10 Essential (primary) hypertension: Secondary | ICD-10-CM

## 2018-08-07 DIAGNOSIS — I219 Acute myocardial infarction, unspecified: Secondary | ICD-10-CM | POA: Insufficient documentation

## 2018-08-07 HISTORY — DX: Atherosclerotic heart disease of native coronary artery without angina pectoris: I25.10

## 2018-08-07 MED ORDER — CARVEDILOL 12.5 MG PO TABS: 12.5000 mg | ORAL_TABLET | Freq: Two times a day (BID) | ORAL | 3 refills | Status: DC

## 2018-08-07 MED ORDER — TICAGRELOR 60 MG PO TABS: 60.0000 mg | ORAL_TABLET | Freq: Two times a day (BID) | ORAL | 2 refills | Status: DC

## 2018-08-07 MED ORDER — ATORVASTATIN CALCIUM 80 MG PO TABS: 80.0000 mg | ORAL_TABLET | Freq: Every day | ORAL | 3 refills | Status: DC

## 2018-08-07 MED ORDER — ASPIRIN EC 81 MG PO TBEC: 81.0000 mg | DELAYED_RELEASE_TABLET | Freq: Every day | ORAL | 3 refills | Status: DC

## 2018-08-07 NOTE — Assessment & Plan Note (Signed)
Marty developed chest pain or shortness of breath in the [employer] truck while going through [place] on July 29, 2018, Sunday.  His partner was driving.  They stopped and called the ambulance.  The chest pain was severe and lasted for about 20 minutes.  He was given nitroglycerin and aspirin.  They took him to [facility] in [place], [place] on April 20 where he was found to have elevated cardiac enzymes.    He was transferred to [facility].  He had a heart catheterization on Monday and was found to have a 90% blockage of the artery (he is not sure which one).  One stent was placed.  He was discharged on Tuesday and flew back home.    He is new medicines are Coreg 12.5 mg twice daily, Brilinta.  He has stopped naproxen and fish oil.  Cardiology referral.  Continue current meds  

## 2018-08-07 NOTE — Assessment & Plan Note (Signed)
-

## 2018-08-07 NOTE — Assessment & Plan Note (Signed)
Andrew Carson developed chest pain or shortness of breath in the [employer] truck while going through [place] on July 29, 2018, Sunday.  His partner was driving.  They stopped and called the ambulance.  The chest pain was severe and lasted for about 20 minutes.  He was given nitroglycerin and aspirin.  They took him to [facility] in [place], [place] on April 20 where he was found to have elevated cardiac enzymes.    He was transferred to [facility].  He had a heart catheterization on Monday and was found to have a 90% blockage of the artery (he is not sure which one).  One stent was placed.  He was discharged on Tuesday and flew back home.    He is new medicines are Coreg 12.5 mg twice daily, Brilinta.  He has stopped naproxen and fish oil.  Cardiology referral.  Continue current meds

## 2018-08-07 NOTE — Progress Notes (Signed)
Virtual Visit via Video Note  I connected with Donielle Heckel Bidwell on 08/07/18 at 10:40 AM EDT by a video enabled telemedicine application and verified that I am speaking with the correct person using two identifiers.   I discussed the limitations of evaluation and management by telemedicine and the availability of in person appointments. The patient expressed understanding and agreed to proceed.  History of Present Illness:   Andrew Carson developed chest pain or shortness of breath in the [employer] truck while going through [place] on July 29, 2018, Sunday.  His partner was driving.  They stopped and called the ambulance.  The chest pain was severe and lasted for about 20 minutes.  He was given nitroglycerin and aspirin.  They took him to [facility] in [place], [place] on April 20 where he was found to have elevated cardiac enzymes.    He was transferred to [facility].  He had a heart catheterization on Monday and was found to have a 90% blockage of the artery (he is not sure which one).  One stent was placed.  He was discharged on Tuesday and flew back home.    He is at home now.  He is doing well.  He is planning to go back to work on Saturday night.  He is on a low-salt low-fat diet now.  He lost 15 pounds.  His blood pressure today is 122/87 with a heart rate of 67.  He is new medicines are Coreg 12.5 mg twice daily, Brilinta.  He has stopped naproxen and fish oil.  Observations/Objective:  He is in no acute distress.  He is overweight Assessment and Plan:  See plan Follow Up Instructions:    I discussed the assessment and treatment plan with the patient. The patient was provided an opportunity to ask questions and all were answered. The patient agreed with the plan and demonstrated an understanding of the instructions.   The patient was advised to call back or seek an in-person evaluation if the symptoms worsen or if the condition fails to improve as  anticipated.  I provided 25 minutes of non-face-to-face time during this encounter.   Sonda Primes, MD

## 2018-08-07 NOTE — Assessment & Plan Note (Signed)
Continue with Lotensin HCT, Coreg

## 2018-08-13 ENCOUNTER — Ambulatory Visit: Payer: BLUE CROSS/BLUE SHIELD | Admitting: Internal Medicine

## 2018-08-15 ENCOUNTER — Telehealth (HOSPITAL_COMMUNITY): Payer: Self-pay | Admitting: *Deleted

## 2018-08-15 ENCOUNTER — Telehealth: Payer: Self-pay | Admitting: Cardiology

## 2018-08-15 NOTE — Telephone Encounter (Signed)
Virtual Visit Pre-Appointment Phone Call  "(Name), I am calling you today to discuss your upcoming appointment. We are currently trying to limit exposure to the virus that causes COVID-19 by seeing patients at home rather than in the office."  1. "What is the BEST phone number to call the day of the visit?" - include this in appointment notes  2. Do you have or have access to (through a family member/friend) a smartphone with video capability that we can use for your visit?" a. If yes - list this number in appt notes as cell (if different from BEST phone #) and list the appointment type as a VIDEO visit in appointment notes b. If no - list the appointment type as a PHONE visit in appointment notes  Confirm consent - "In the setting of the current Covid19 crisis, you are scheduled for a (phone or video) visit with your provider on (date) at (time).  Just as we do with many in-office visits, in order for you to participate in this visit, we must obtain consent.  If you'd like, I can send this to your mychart (if signed up) or email for you to review.  Otherwise, I can obtain your verbal consent now.  All virtual visits are billed to your insurance company just like a normal visit would be.  By agreeing to a virtual visit, we'd like you to understand that the technology does not allow for your provider to perform an examination, and thus may limit your provider's ability to fully assess your condition. If your provider identifies any concerns that need to be evaluated in person, we will make arrangements to do so.  Finally, though the technology is pretty good, we cannot assure that it will always work on either your or our end, and in the setting of a video visit, we may have to convert it to a phone-only visit.  In either situation, we cannot ensure that we have a secure connection.  Are you willing to proceed?"   Yes/new pt/[email or web address]/ smartphone  [phone] consent  08/15/27/vitals/  3. Advise patient to be prepared - "Two hours prior to your appointment, go ahead and check your blood pressure, pulse, oxygen saturation, and your weight (if you have the equipment to check those) and write them all down. When your visit starts, your provider will ask you for this information. If you have an Apple Watch or Kardia device, please plan to have heart rate information ready on the day of your appointment. Please have a pen and paper handy nearby the day of the visit as well."  4. Give patient instructions for MyChart download to smartphone OR Doximity/[email or web address] as below if video visit (depending on what platform provider is using)  5. Inform patient they will receive a phone call 15 minutes prior to their appointment time (may be from unknown caller ID) so they should be prepared to answer    TELEPHONE CALL NOTE  Andrew Carson has been deemed a candidate for a follow-up tele-health visit to limit community exposure during the Covid-19 pandemic. I spoke with the patient via phone to ensure availability of phone/video source, confirm preferred email & phone number, and discuss instructions and expectations.  I reminded Andrew Carson to be prepared with any vital sign and/or heart rhythm information that could potentially be obtained via home monitoring, at the time of his visit. I reminded Andrew Carson to expect a phone call prior to his visit.  [name]  08/15/2018 9:38 AM   INSTRUCTIONS FOR DOWNLOADING THE MYCHART APP TO SMARTPHONE  - The patient must first make sure to have activated MyChart and know their login information - If Apple, go to [employer] and type in MyChart in the search bar and download the app. If Android, ask patient to go to [employer] and type in [place] in the search bar and download the app. The app is free but as with any other app downloads, their phone may require them to verify saved payment information or Apple/Android  password.  - The patient will need to then log into the app with their MyChart username and password, and select  as their healthcare provider to link the account. When it is time for your visit, go to the MyChart app, find appointments, and click Begin Video Visit. Be sure to Select Allow for your device to access the Microphone and Camera for your visit. You will then be connected, and your provider will be with you shortly.  **If they have any issues connecting, or need assistance please contact MyChart service desk [phone])**  **If using a computer, in order to ensure the best quality for their visit they will need to use either of the following Internet Browsers: [employer] or Google Chrome**  IF USING DOXIMITY or DOXY.ME - The patient will receive a link just prior to their visit by text.     FULL LENGTH CONSENT FOR TELE-HEALTH VISIT   I hereby voluntarily request, consent and authorize [facility] and its employed or contracted physicians, physician assistants, nurse practitioners or other licensed health care professionals (the Practitioner), to provide me with telemedicine health care services (the Services") as deemed necessary by the treating Practitioner. I acknowledge and consent to receive the Services by the Practitioner via telemedicine. I understand that the telemedicine visit will involve communicating with the Practitioner through live audiovisual communication technology and the disclosure of certain medical information by electronic transmission. I acknowledge that I have been given the opportunity to request an in-person assessment or other available alternative prior to the telemedicine visit and am voluntarily participating in the telemedicine visit.  I understand that I have the right to withhold or withdraw my consent to the use of telemedicine in the course of my care at any time, without affecting my right to future care or treatment,  and that the Practitioner or I may terminate the telemedicine visit at any time. I understand that I have the right to inspect all information obtained and/or recorded in the course of the telemedicine visit and may receive copies of available information for a reasonable fee.  I understand that some of the potential risks of receiving the Services via telemedicine include:   Delay or interruption in medical evaluation due to technological equipment failure or disruption;  Information transmitted may not be sufficient (e.g. poor resolution of images) to allow for appropriate medical decision making by the Practitioner; and/or   In rare instances, security protocols could fail, causing a breach of personal health information.  Furthermore, I acknowledge that it is my responsibility to provide information about my medical history, conditions and care that is complete and accurate to the best of my ability. I acknowledge that Practitioner's advice, recommendations, and/or decision may be based on factors not within their control, such as incomplete or inaccurate data provided by me or distortions of diagnostic images or specimens that may result from electronic transmissions. I understand that the practice of medicine  is not an Chief Strategy Officer and that Practitioner makes no warranties or guarantees regarding treatment outcomes. I acknowledge that I will receive a copy of this consent concurrently upon execution via email to the email address I last provided but may also request a printed copy by calling the office of [place].    I understand that my insurance will be billed for this visit.   I have read or had this consent read to me.  I understand the contents of this consent, which adequately explains the benefits and risks of the Services being provided via telemedicine.   I have been provided ample opportunity to ask questions regarding this consent and the Services and have had my questions  answered to my satisfaction.  I give my informed consent for the services to be provided through the use of telemedicine in my medical care  By participating in this telemedicine visit I agree to the above.

## 2018-08-15 NOTE — Telephone Encounter (Signed)
Called and spoke to pt wife. Pt working out of town.  Pt is a truck driver for [employer]. Received notification of pt hospitalization in [place] for 07/29/18 NSTEMi and 07/30/18 DES Lad.  Pt has upcoming appt on 5/8 to establish cardiology care with Dr. Mayford Knife.  Provided a general overview of Cardiac Rehab phase II and continued closure due to adherence of national recommendation for Covid-19 in group setting. Pt wife felt with his work schedule that takes him out of town most of the week this would be difficult for him. Asked if pt has a smartphone for possible virtual home base Cardiac Rehab.  Pt wife indicated that he did. Pt wife admits that she is uncertain of what her husband should eat particularly when he is on the road.  Will send pt information on low sodium, heart healthy with diabetic modifications along with exercise. Pt and pt wife will talk more with Dr. Mayford Knife regarding referral for cardiac rehab.  Will continue to follow. Alanson Aly, BSN Cardiac and Emergency planning/management officer

## 2018-08-16 NOTE — Progress Notes (Signed)
Virtual Visit via Video Note   This visit type was conducted due to national recommendations for restrictions regarding the COVID-19 Pandemic (e.g. social distancing) in an effort to limit this patient's exposure and mitigate transmission in our community.  Due to his co-morbid illnesses, this patient is at least at moderate risk for complications without adequate follow up.  This format is felt to be most appropriate for this patient at this time.  All issues noted in this document were discussed and addressed.  A limited physical exam was performed with this format.  Please refer to the patient's chart for his consent to telehealth for [facility].   Evaluation Performed: Cardiology Consult  This visit type was conducted due to national recommendations for restrictions regarding the COVID-19 Pandemic (e.g. social distancing).  This format is felt to be most appropriate for this patient at this time.  All issues noted in this document were discussed and addressed.  No physical exam was performed (except for noted visual exam findings with Video Visits).  Please refer to the patient's chart (MyChart message for video visits and phone note for telephone visits) for the patient's consent to telehealth for [facility].  Date:  08/17/2018   ID:  Andrew Carson, DOB 1955/09/17, MRN [id number]  Patient Location:  [place]  Provider location:   [place]  PCP:  Tresa Garter, MD  Cardiologist:  NEW Electrophysiologist:  None   Chief Complaint:  Chest pain and SOB  History of Present Illness:    Andrew Carson is a 63 y.o. male who presents via audio/video conferencing for a telehealth visit today in referral by Jacinta Shoe, MD for evaluation of chest pain.  This is a 63yo male with a history of HTN and  Hyperlipidemia.  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  The pain was very severe and lasted 20 minutes.  He stopped  and called an ambulance and was given NGT and ASA.  In ER in [place] he ruled in for MI with elevated troponin.  He was transferred to [facility] and underwent cath showing a 90% blockage although he is unsure which artery it was.  He had 1 stent placed and then flew home.  Since then he has done well with no further CP.  He is on DAPT with ASA and Brilinta and also on BB.  He is also on high dose statin.   He is here today for followup and is doing well.  He denies any chest pain or pressure, SOB, DOE, PND, orthopnea, LE edema, dizziness, palpitations or syncope. He is compliant with his meds and is tolerating meds with no SE.    The patient does not have symptoms concerning for COVID-19 infection (fever, chills, cough, or new shortness of breath).    Prior CV studies:   The following studies were reviewed today:  none  Past Medical History:  Diagnosis Date  . ABNORMAL GLUCOSE NEC 08/24/2007   Qualifier: Diagnosis of  By: Plotnikov MD, Georgina Quint   . Anemia 08/11/2017   Colon  2017 Dr Leone Payor Start iron 2019 GI ref if Hgb not better  . Coronary artery disease 08/07/2018   April 2020  [name] developed chest pain or shortness of breath in the [employer] truck while going through [place] on July 29, 2018, Sunday.  His partner was driving.  They stopped and called the ambulance.  The chest pain  was severe and lasted for about 20 minutes.  He was given nitroglycerin and aspirin.  They took him to [facility] in [place], [place] on April 20 where he was found t  . Diabetes mellitus 2011   type 2  . Dyslipidemia (high LDL; low HDL) 10/15/2010   Chronic Cardiac CT scoring info given 11/19  . Elevated PSA 09/06/2013   5/15 due to UTI Dr Annabell Howells   . Essential hypertension 02/23/2007   Chronic Lotensin HCT, Coreg  Cardiac CT scoring test offered 11/19  . Finger pain, right 02/11/2011   10/12 R 4th prox phal - likely a partial flexor tendon rupture   . GERD 03/12/2007     On Omeprazole  . GERD (gastroesophageal reflux disease)   . Hypertension   . Hypogonadism male   . KNEE PAIN 03/12/2007   11/19 L   . Myocardial infarction [facility]) 08/07/2018   July 29, 2018  [name] developed chest pain or shortness of breath in the [employer] truck while going through [place] on July 29, 2018, Sunday.  His partner was driving.  They stopped and called the ambulance.  The chest pain was severe and lasted for about 20 minutes.  He was given nitroglycerin and aspirin.  They took him to [facility] in [place], [place] on April 20 where he was fou  . Obesity   . Osteoarthritis   . OSTEOARTHRITIS 03/12/2007   Chronic     . Phimosis 01/29/2016   10/17 candida due to [place] - tolerable  . UPPER RESPIRATORY INFECTION, ACUTE 06/11/2010   Qualifier: Diagnosis of  By: Posey Rea MD, Georgina Quint URTICARIA 06/05/2009   Qualifier: Diagnosis of  By: Posey Rea MD, Georgina Quint   . UTI (urinary tract infection) 09/06/2013   5/15 dr Annabell Howells   . Venous insufficiency 08/19/2011   B LE mild R>L   . Vitamin D deficiency 03/14/2007   Chronic      Past Surgical History:  Procedure Laterality Date  . LUMBAR LAMINECTOMY  07/1995   L5-S1  . WISDOM TOOTH EXTRACTION       Current Meds  Medication Sig  . aspirin EC 81 MG tablet Take 1 tablet (81 mg total) by mouth daily.  Marland Kitchen atorvastatin (LIPITOR) 80 MG tablet Take 1 tablet (80 mg total) by mouth daily.  . benazepril-hydrochlorthiazide (LOTENSIN HCT) 20-12.5 MG tablet TAKE 2 TABLETS BY MOUTH EVERY DAY  . carvedilol (COREG) 12.5 MG tablet Take 1 tablet (12.5 mg total) by mouth 2 (two) times daily with a meal.  . [employer] D3 25 MCG (1000 UT) capsule TAKE 1 CAPSULE BY MOUTH EVERY DAY  . FARXIGA 10 MG TABS tablet TAKE 1 TABLET BY MOUTH EVERY DAY  . ferrous sulfate 325 (65 FE) MG tablet Take 1 tablet (325 mg total) by mouth daily.  Marland Kitchen glucose blood test strip 1 each by Other route 2 (two) times daily. Use as instructed   . ketoconazole (NIZORAL) 2 %  cream Apply 1 application topically as needed for irritation.  . mometasone (ELOCON) 0.1 % cream APPLY TO AFFECTED AREA EVERY DAY  . omeprazole (PRILOSEC) 20 MG capsule TAKE 1 CAPSULE BY MOUTH EVERY DAY  . sitaGLIPtin-metformin (JANUMET) 50-1000 MG tablet Take 1 tablet by mouth 2 (two) times daily with a meal.  . ticagrelor (BRILINTA) 90 MG TABS tablet Take by mouth 2 (two) times daily.  . [DISCONTINUED] ticagrelor (BRILINTA) 60 MG TABS tablet Take 1 tablet (60 mg total) by mouth 2 (two)  times daily.   Current Facility-Administered Medications for the 08/17/18 encounter (Telemedicine) with Quintella Reichert, MD  Medication  . 0.9 %  sodium chloride infusion     Allergies:   Triamcinolone   Social History   Tobacco Use  . Smoking status: Never Smoker  . Smokeless tobacco: Never Used  Substance Use Topics  . Alcohol use: No  . Drug use: No     Family Hx: The patient's family history includes COPD in his mother; Heart disease (age of onset: 7) in his father; Heart disease (age of onset: 82) in his mother; Kidney disease in an other family member. There is no history of Colon cancer, Esophageal cancer, Rectal cancer, or Stomach cancer.  ROS:   Please see the history of present illness.     All other systems reviewed and are negative.   Labs/Other Tests and Data Reviewed:    Recent Labs: 11/10/2017: BUN 20; Creatinine 1.2; Potassium 4.2; Sodium 139 11/11/2017: ALT 29; Hemoglobin 13.2; Platelets 293   Recent Lipid Panel Lab Results  Component Value Date/Time   CHOL 200 12/20/2016   TRIG 187 (A) 12/20/2016   HDL 32 (A) 12/20/2016   CHOLHDL 6.1 (H) 09/18/2015 08:17 AM   LDLCALC 136 12/20/2016   LDLDIRECT 149.9 03/12/2007 08:34 AM    Wt Readings from Last 3 Encounters:  08/17/18 279 lb (126.6 kg)  05/14/18 298 lb (135.2 kg)  02/12/18 293 lb (132.9 kg)     Objective:    Vital Signs:  BP 138/86   Pulse 96   Ht 5\' 10"  (1.778 m)   Wt 279 lb (126.6 kg)   BMI 40.03 kg/m     CONSTITUTIONAL:  Well nourished, well developed male in no acute distress.  EYES: anicteric MOUTH: oral mucosa is pink RESPIRATORY: Normal respiratory effort, symmetric expansion CARDIOVASCULAR: No peripheral edema SKIN: No rash, lesions or ulcers MUSCULOSKELETAL: no digital cyanosis NEURO: Cranial Nerves II-XII grossly intact, moves all extremities PSYCH: Intact judgement and insight.  A&O x 3, Mood/affect appropriate   ASSESSMENT & PLAN:    1.  ASCAD - he is s/p NSTEMI with cath showing 90% stenosis and now s/p PCI but patient does not know which artery it was and we have no records at this time.  I will get his records of the cath and labs for review.  He has not had any further anginal sx.  He will continue on DAPT with ASA and brilinta for at least 12 months.  He is on high dose statin as well and BB.    2.  Hypertension - his BP is well controlled.  He will continue on Carvedilol 12.5mg  BID and benazepril-HCT 20-12.5mg  daily.    3.  Hyperlipidemia - his LDL goal is < 70.  He is on high dose lipitor 80mg  daily.  I will repeat an FLP and ALT in June.   4.  Type 2 DM - this is followed by his PCP.  His HbA1C was 6.7 last fall.   He will continue on Sitabliptin-metformin 50-1000mg  BID.    5.  COVID-19 Education:The signs and symptoms of COVID-19 were discussed with the patient and how to seek care for testing (follow up with PCP or arrange E-visit).  The importance of social distancing was discussed today.  Patient Risk:   After full review of this patient's clinical status, I feel that they are at least moderate risk at this time.  Time:   Today, I have spent 20 minutes directly with  the patient on video discussing medical problems including CAD, MI, lipids, DM, HTN.  We also reviewed the symptoms of COVID 19 and the ways to protect against contracting the virus with telehealth technology.  I spent an additional 10 minutes reviewing patient's chart including office notes from PCP.   Medication Adjustments/Labs and Tests Ordered: Current medicines are reviewed at length with the patient today.  Concerns regarding medicines are outlined above.  Tests Ordered: No orders of the defined types were placed in this encounter.  Medication Changes: No orders of the defined types were placed in this encounter.   Disposition:  Follow up 3 months  Signed, Armanda Magic, MD  08/17/2018 9:58 AM    [facility]

## 2018-08-17 ENCOUNTER — Encounter: Payer: Self-pay | Admitting: Cardiology

## 2018-08-17 ENCOUNTER — Other Ambulatory Visit: Payer: Self-pay

## 2018-08-17 ENCOUNTER — Telehealth (INDEPENDENT_AMBULATORY_CARE_PROVIDER_SITE_OTHER): Payer: BLUE CROSS/BLUE SHIELD | Admitting: Cardiology

## 2018-08-17 VITALS — BP 138/86 | HR 96 | Ht 70.0 in | Wt 279.0 lb

## 2018-08-17 DIAGNOSIS — Z7189 Other specified counseling: Secondary | ICD-10-CM

## 2018-08-17 DIAGNOSIS — E785 Hyperlipidemia, unspecified: Secondary | ICD-10-CM

## 2018-08-17 DIAGNOSIS — E1122 Type 2 diabetes mellitus with diabetic chronic kidney disease: Secondary | ICD-10-CM

## 2018-08-17 DIAGNOSIS — I2510 Atherosclerotic heart disease of native coronary artery without angina pectoris: Secondary | ICD-10-CM | POA: Diagnosis not present

## 2018-08-17 DIAGNOSIS — I10 Essential (primary) hypertension: Secondary | ICD-10-CM

## 2018-08-17 DIAGNOSIS — Z794 Long term (current) use of insulin: Secondary | ICD-10-CM

## 2018-08-17 DIAGNOSIS — N181 Chronic kidney disease, stage 1: Secondary | ICD-10-CM

## 2018-08-17 MED ORDER — TICAGRELOR 90 MG PO TABS: 90.0000 mg | ORAL_TABLET | Freq: Two times a day (BID) | ORAL | 3 refills | Status: DC

## 2018-08-17 MED ORDER — CARVEDILOL 12.5 MG PO TABS: 12.5000 mg | ORAL_TABLET | Freq: Two times a day (BID) | ORAL | 3 refills | Status: DC

## 2018-08-17 MED ORDER — BENAZEPRIL-HYDROCHLOROTHIAZIDE 20-12.5 MG PO TABS: 1.0000 | ORAL_TABLET | Freq: Every day | ORAL | 3 refills | Status: DC

## 2018-08-17 MED ORDER — ATORVASTATIN CALCIUM 80 MG PO TABS: 80.0000 mg | ORAL_TABLET | Freq: Every day | ORAL | 3 refills | Status: DC

## 2018-08-17 NOTE — Patient Instructions (Signed)
Medication Instructions:  Your physician recommends that you continue on your current medications as directed. Please refer to the Current Medication list given to you today.  If you need a refill on your cardiac medications before your next appointment, please call your pharmacy.   Lab work: Lipid and Liver: Will send a script to your home on 08/27/18  If you have labs (blood work) drawn today and your tests are completely normal, you will receive your results only by: Marland Kitchen MyChart Message (if you have MyChart) OR . A paper copy in the mail If you have any lab test that is abnormal or we need to change your treatment, we will call you to review the results.  Testing/Procedures: None  Follow-Up: on 11/23/18 at 8 am

## 2018-08-21 ENCOUNTER — Other Ambulatory Visit (HOSPITAL_COMMUNITY): Payer: Self-pay | Admitting: *Deleted

## 2018-08-21 DIAGNOSIS — Z955 Presence of coronary angioplasty implant and graft: Secondary | ICD-10-CM

## 2018-08-21 DIAGNOSIS — I214 Non-ST elevation (NSTEMI) myocardial infarction: Secondary | ICD-10-CM

## 2018-08-21 NOTE — Telephone Encounter (Signed)
-----   Message from Quintella Reichert, MD sent at 08/17/2018  4:24 PM EDT ----- Regarding: RE: Cardiac Rehab I definitely would like him in rehab if you could place referral and I can sign it ----- Message ----- From: Chelsea Aus, RN Sent: 08/17/2018   3:52 PM EDT To: Quintella Reichert, MD Subject: Cardiac Rehab                                  Dr. Mayford Knife,  Received notification from [facility] [place] for the above pt who had Stemi and DES to LAD  in April lives in the [place] area.  Contacted and spoke to pt wife regarding cardiac rehab. Some hesitation due to her husband work schedule - Pt is only home on Fridays. Seen today by you for establishing care. If you feel he would benefit from Cardiac rehab please place a referral or I can place one pending your co signature whichever works best for you. In case he can not come I have already sent him education material on exercise, heart healthy diet and diabetes management.  Thanks so much Pharmacist, hospital, BSN Cardiac and Emergency planning/management officer

## 2018-09-10 ENCOUNTER — Other Ambulatory Visit: Payer: Self-pay | Admitting: Cardiology

## 2018-09-11 LAB — HEPATIC FUNCTION PANEL
AG Ratio: 1.5 (calc) (ref 1.0–2.5)
ALT: 32 U/L (ref 9–46)
AST: 20 U/L (ref 10–35)
Albumin: 4 g/dL (ref 3.6–5.1)
Alkaline phosphatase (APISO): 45 U/L (ref 35–144)
Bilirubin, Direct: 0.2 mg/dL (ref 0.0–0.2)
Globulin: 2.6 g/dL (calc) (ref 1.9–3.7)
Indirect Bilirubin: 0.6 mg/dL (calc) (ref 0.2–1.2)
Total Bilirubin: 0.8 mg/dL (ref 0.2–1.2)
Total Protein: 6.6 g/dL (ref 6.1–8.1)

## 2018-09-11 LAB — LIPID PANEL
Cholesterol: 93 mg/dL (ref <200)
HDL: 30 mg/dL — ABNORMAL LOW (ref >=40)
LDL Cholesterol (Calc): 41 mg/dL (calc)
Non-HDL Cholesterol (Calc): 63 mg/dL (calc) (ref <130)
Total CHOL/HDL Ratio: 3.1 (calc) (ref <5.0)
Triglycerides: 132 mg/dL (ref <150)

## 2018-09-24 ENCOUNTER — Other Ambulatory Visit: Payer: Self-pay | Admitting: Internal Medicine

## 2018-09-26 ENCOUNTER — Telehealth (HOSPITAL_COMMUNITY): Payer: Self-pay

## 2018-09-26 NOTE — Telephone Encounter (Signed)
Called and spoke with pt in regards to our Virtual Cardiac Rehab, pt stated he would like to wait until the in gym opens back open.

## 2018-10-01 ENCOUNTER — Encounter: Payer: Self-pay | Admitting: Internal Medicine

## 2018-10-05 ENCOUNTER — Telehealth (HOSPITAL_COMMUNITY): Payer: Self-pay

## 2018-10-05 NOTE — Telephone Encounter (Signed)
Pt insurance is active and benefits verified through [facility]. Co-pay $0.00, DED $100.00/$100.00 met, out of pocket $1,000.00/$863.70 met, co-insurance 20%. No pre-authorization required. Passport, 10/05/2018 @ 11:17AM, ITV#[id number]

## 2018-10-08 ENCOUNTER — Ambulatory Visit: Payer: BLUE CROSS/BLUE SHIELD | Admitting: Internal Medicine

## 2018-10-16 ENCOUNTER — Telehealth (HOSPITAL_COMMUNITY): Payer: Self-pay

## 2018-10-16 NOTE — Telephone Encounter (Signed)
Phone call made to Pt to inquire about Phase II CR. Pt is scheduled for orientation and was informed to wear a face covering when reporting to CR. Pt was given directions to department. Insurance coverage was covered with Pt.

## 2018-10-17 ENCOUNTER — Telehealth (HOSPITAL_COMMUNITY): Payer: Self-pay | Admitting: *Deleted

## 2018-10-17 ENCOUNTER — Telehealth (HOSPITAL_COMMUNITY): Payer: Self-pay

## 2018-10-17 NOTE — Telephone Encounter (Signed)
Called to do a verbal health assessment with patient in preparation for orientation to Cardiac Rehab 10/18/2018.

## 2018-10-17 NOTE — Telephone Encounter (Signed)
Cardiac Rehab Medication Review by a Pharmacist  Does the patient  feel that his/her medications are working for him/her?  yes  Has the patient been experiencing any side effects to the medications prescribed?  no  Does the patient measure his/her own blood pressure or blood glucose at home?  BP: yes - 120/70s; Glucose: no   Does the patient have any problems obtaining medications due to transportation or finances?   no  Understanding of regimen: good Understanding of indications: good Potential of compliance: good   Pharmacist comments: none   Andrew Carson, PharmD PGY2 Cardiology Pharmacy Resident Phone [phone] 10/17/2018       9:53 AM  Please check [email or web address] for unit-specific pharmacist phone numbers

## 2018-10-18 ENCOUNTER — Ambulatory Visit (INDEPENDENT_AMBULATORY_CARE_PROVIDER_SITE_OTHER): Payer: BC Managed Care – PPO | Admitting: Internal Medicine

## 2018-10-18 ENCOUNTER — Other Ambulatory Visit: Payer: Self-pay

## 2018-10-18 ENCOUNTER — Encounter (HOSPITAL_COMMUNITY)
Admission: RE | Admit: 2018-10-18 | Discharge: 2018-10-18 | Disposition: A | Discharge status: Home / Self Care | Payer: BC Managed Care – PPO | Source: Ambulatory Visit | Attending: Cardiology | Admitting: Cardiology

## 2018-10-18 ENCOUNTER — Encounter (HOSPITAL_COMMUNITY): Payer: Self-pay

## 2018-10-18 ENCOUNTER — Other Ambulatory Visit: Payer: Self-pay | Admitting: Internal Medicine

## 2018-10-18 ENCOUNTER — Encounter: Payer: Self-pay | Admitting: Internal Medicine

## 2018-10-18 VITALS — Ht 70.0 in | Wt 274.9 lb

## 2018-10-18 DIAGNOSIS — Z955 Presence of coronary angioplasty implant and graft: Secondary | ICD-10-CM | POA: Diagnosis present

## 2018-10-18 DIAGNOSIS — Z6841 Body Mass Index (BMI) 40.0 and over, adult: Secondary | ICD-10-CM

## 2018-10-18 DIAGNOSIS — E1122 Type 2 diabetes mellitus with diabetic chronic kidney disease: Secondary | ICD-10-CM | POA: Diagnosis not present

## 2018-10-18 DIAGNOSIS — I214 Non-ST elevation (NSTEMI) myocardial infarction: Secondary | ICD-10-CM | POA: Diagnosis present

## 2018-10-18 DIAGNOSIS — N181 Chronic kidney disease, stage 1: Secondary | ICD-10-CM

## 2018-10-18 DIAGNOSIS — Z794 Long term (current) use of insulin: Secondary | ICD-10-CM

## 2018-10-18 DIAGNOSIS — I872 Venous insufficiency (chronic) (peripheral): Secondary | ICD-10-CM | POA: Insufficient documentation

## 2018-10-18 DIAGNOSIS — R972 Elevated prostate specific antigen [PSA]: Secondary | ICD-10-CM | POA: Diagnosis not present

## 2018-10-18 DIAGNOSIS — I2510 Atherosclerotic heart disease of native coronary artery without angina pectoris: Secondary | ICD-10-CM

## 2018-10-18 LAB — GLUCOSE, CAPILLARY: Glucose-Capillary: 104 mg/dL — ABNORMAL HIGH (ref 70–99)

## 2018-10-18 NOTE — Assessment & Plan Note (Signed)
F/u w/Dr Wende Mott, Lipitor, Brilinta, ASA 81

## 2018-10-18 NOTE — Assessment & Plan Note (Signed)
A1c is better Lost wt

## 2018-10-18 NOTE — Assessment & Plan Note (Signed)
RLE - chronic Try GLYTONE exfoliating body lotion by Desmond Dike (free acid value 17.5) Mometasone cream

## 2018-10-18 NOTE — Patient Instructions (Signed)
Try GLYTONE exfoliating body lotion by Pierre Fabre (free acid value 17.5)  

## 2018-10-18 NOTE — Progress Notes (Signed)
Subjective:  Patient ID: Andrew Carson, male    DOB: [birth year]  Age: 63 y.o. MRN: [id number]  CC: No chief complaint on file.   HPI Andrew Carson presents for HTN, obesity, DM f/u Lost wt on diet  Outpatient Medications Prior to Visit  Medication Sig Dispense Refill  . aspirin EC 81 MG tablet Take 1 tablet (81 mg total) by mouth daily. 100 tablet 3  . atorvastatin (LIPITOR) 80 MG tablet Take 1 tablet (80 mg total) by mouth daily. 90 tablet 3  . benazepril-hydrochlorthiazide (LOTENSIN HCT) 20-12.5 MG tablet Take 1 tablet by mouth daily. 90 tablet 3  . carvedilol (COREG) 12.5 MG tablet Take 1 tablet (12.5 mg total) by mouth 2 (two) times daily with a meal. 180 tablet 3  . [facility] D3 25 MCG (1000 UT) capsule TAKE 1 CAPSULE BY MOUTH EVERY DAY 90 capsule 3  . FARXIGA 10 MG TABS tablet TAKE 1 TABLET BY MOUTH EVERY DAY 90 tablet 3  . ferrous sulfate 325 (65 FE) MG tablet TAKE 1 TABLET BY MOUTH EVERY DAY 90 tablet 1  . glucose blood test strip 1 each by Other route 2 (two) times daily. Use as instructed     . ketoconazole (NIZORAL) 2 % cream Apply 1 application topically as needed for irritation.    . mometasone (ELOCON) 0.1 % cream APPLY TO AFFECTED AREA EVERY DAY 45 g 3  . nitroGLYCERIN (NITROSTAT) 0.4 MG SL tablet Place 0.4 mg under the tongue every 5 (five) minutes as needed for chest pain.    Marland Kitchen omeprazole (PRILOSEC) 20 MG capsule TAKE 1 CAPSULE BY MOUTH EVERY DAY 90 capsule 3  . sitaGLIPtin-metformin (JANUMET) 50-1000 MG tablet Take 1 tablet by mouth 2 (two) times daily with a meal. 180 tablet 1  . ticagrelor (BRILINTA) 90 MG TABS tablet Take 1 tablet (90 mg total) by mouth 2 (two) times daily. 180 tablet 3   Facility-Administered Medications Prior to Visit  Medication Dose Route Frequency Provider Last Rate Last Dose  . 0.9 %  sodium chloride infusion  500 mL Intravenous Continuous Iva Boop, MD        ROS: Review of Systems  Constitutional: Negative for appetite change,  fatigue and unexpected weight change.  HENT: Negative for congestion, nosebleeds, sneezing, sore throat and trouble swallowing.   Eyes: Negative for itching and visual disturbance.  Respiratory: Negative for cough.   Cardiovascular: Negative for chest pain, palpitations and leg swelling.  Gastrointestinal: Negative for abdominal distention, blood in stool, diarrhea and nausea.  Genitourinary: Negative for frequency and hematuria.  Musculoskeletal: Negative for back pain, gait problem, joint swelling and neck pain.  Skin: Positive for color change and rash.  Neurological: Negative for dizziness, tremors, speech difficulty and weakness.  Psychiatric/Behavioral: Negative for agitation, dysphoric mood, sleep disturbance and suicidal ideas. The patient is not nervous/anxious.     Objective:  BP 128/74 (BP Location: Left Arm, Patient Position: Sitting, Cuff Size: Large)   Pulse 63   Temp 98 F (36.7 C) (Oral)   Ht 5\' 10"  (1.778 m)   Wt 275 lb (124.7 kg)   SpO2 98%   BMI 39.46 kg/m   BP Readings from Last 3 Encounters:  10/18/18 128/74  08/17/18 138/86  05/14/18 124/76    Wt Readings from Last 3 Encounters:  10/18/18 275 lb (124.7 kg)  08/17/18 279 lb (126.6 kg)  05/14/18 298 lb (135.2 kg)    Physical Exam Constitutional:      General:  He is not in acute distress.    Appearance: He is well-developed.     Comments: NAD  Eyes:     Conjunctiva/sclera: Conjunctivae normal.     Pupils: Pupils are equal, round, and reactive to light.  Neck:     Musculoskeletal: Normal range of motion.     Thyroid: No thyromegaly.     Vascular: No JVD.  Cardiovascular:     Rate and Rhythm: Normal rate and regular rhythm.     Heart sounds: Normal heart sounds. No murmur. No friction rub. No gallop.   Pulmonary:     Effort: Pulmonary effort is normal. No respiratory distress.     Breath sounds: Normal breath sounds. No wheezing or rales.  Chest:     Chest wall: No tenderness.  Abdominal:      General: Bowel sounds are normal. There is no distension.     Palpations: Abdomen is soft. There is no mass.     Tenderness: There is no abdominal tenderness. There is no guarding or rebound.  Musculoskeletal: Normal range of motion.        General: No tenderness.  Lymphadenopathy:     Cervical: No cervical adenopathy.  Skin:    General: Skin is warm and dry.     Findings: Rash present.  Neurological:     Mental Status: He is alert and oriented to person, place, and time.     Cranial Nerves: No cranial nerve deficit.     Motor: No abnormal muscle tone.     Coordination: Coordination normal.     Gait: Gait normal.     Deep Tendon Reflexes: Reflexes are normal and symmetric.  Psychiatric:        Behavior: Behavior normal.        Thought Content: Thought content normal.        Judgment: Judgment normal.   obese Rash - RLE, scaly  Lab Results  Component Value Date   WBC 7.5 11/11/2017   HGB 13.2 (A) 11/11/2017   HCT 40 (A) 11/11/2017   PLT 293 11/11/2017   GLUCOSE 146 (H) 09/18/2015   CHOL 93 09/10/2018   TRIG 132 09/10/2018   HDL 30 (L) 09/10/2018   LDLDIRECT 149.9 03/12/2007   LDLCALC 41 09/10/2018   ALT 32 09/10/2018   AST 20 09/10/2018   NA 139 11/10/2017   K 4.2 11/10/2017   CL 105 09/18/2015   CREATININE 1.2 11/10/2017   BUN 20 11/10/2017   CO2 27 09/18/2015   TSH 1.77 12/20/2016   PSA 0.7 12/20/2016   HGBA1C 6.7 11/11/2017   MICROALBUR 0.2 12/20/2016    Dg Finger Ring Right  Result Date: 02/11/2011 *RADIOLOGY REPORT* Clinical Data: Right ring finger pain. RIGHT RING FINGER 2+V Comparison: None. Findings: Anatomic alignment.  No fracture.  Soft tissues appear within normal limits. No radiopaque foreign body. IMPRESSION: Negative. Original Report Authenticated By: Dereck Ligas, M.D.   Assessment & Plan:   There are no diagnoses linked to this encounter.   No orders of the defined types were placed in this encounter.    Follow-up: No follow-ups on  file.  Walker Kehr, MD

## 2018-10-18 NOTE — Assessment & Plan Note (Signed)
Wt Readings from Last 3 Encounters:  10/18/18 275 lb (124.7 kg)  08/17/18 279 lb (126.6 kg)  05/14/18 298 lb (135.2 kg)

## 2018-10-18 NOTE — Assessment & Plan Note (Signed)
F/u w/dr Wrenn 

## 2018-10-18 NOTE — Progress Notes (Signed)
Cardiac Individual Treatment Plan  Patient Details  Name: Andrew Carson MRN: [id number] Date of Birth: 1956-03-30 Referring Provider:     CARDIAC REHAB PHASE II ORIENTATION from 10/18/2018 in [facility]  Referring Provider  Mayford Knifeurner, Ohioraci R       Initial Encounter Date:    CARDIAC REHAB PHASE II ORIENTATION from 10/18/2018 in [facility]  Date  10/18/18      Visit Diagnosis: NSTEMI (non-ST elevated myocardial infarction) [facility](HCC)  Status post coronary artery stent placement  Patient's Home Medications on Admission:  Current Outpatient Medications:  .  aspirin EC 81 MG tablet, Take 1 tablet (81 mg total) by mouth daily., Disp: 100 tablet, Rfl: 3 .  atorvastatin (LIPITOR) 80 MG tablet, Take 1 tablet (80 mg total) by mouth daily., Disp: 90 tablet, Rfl: 3 .  benazepril-hydrochlorthiazide (LOTENSIN HCT) 20-12.5 MG tablet, Take 1 tablet by mouth daily., Disp: 90 tablet, Rfl: 3 .  carvedilol (COREG) 12.5 MG tablet, Take 1 tablet (12.5 mg total) by mouth 2 (two) times daily with a meal., Disp: 180 tablet, Rfl: 3 .  [employer] D3 25 MCG (1000 UT) capsule, TAKE 1 CAPSULE BY MOUTH EVERY DAY, Disp: 90 capsule, Rfl: 3 .  FARXIGA 10 MG TABS tablet, TAKE 1 TABLET BY MOUTH EVERY DAY, Disp: 90 tablet, Rfl: 3 .  ferrous sulfate 325 (65 FE) MG tablet, TAKE 1 TABLET BY MOUTH EVERY DAY, Disp: 90 tablet, Rfl: 1 .  glucose blood test strip, 1 each by Other route 2 (two) times daily. Use as instructed , Disp: , Rfl:  .  ketoconazole (NIZORAL) 2 % cream, Apply 1 application topically as needed for irritation., Disp: , Rfl:  .  mometasone (ELOCON) 0.1 % cream, APPLY TO AFFECTED AREA EVERY DAY, Disp: 45 g, Rfl: 3 .  nitroGLYCERIN (NITROSTAT) 0.4 MG SL tablet, Place 0.4 mg under the tongue every 5 (five) minutes as needed for chest pain., Disp: , Rfl:  .  omeprazole (PRILOSEC) 20 MG capsule, TAKE 1 CAPSULE BY MOUTH EVERY DAY, Disp: 90 capsule, Rfl: 3 .   sitaGLIPtin-metformin (JANUMET) 50-1000 MG tablet, Take 1 tablet by mouth 2 (two) times daily with a meal., Disp: 180 tablet, Rfl: 1 .  ticagrelor (BRILINTA) 90 MG TABS tablet, Take 1 tablet (90 mg total) by mouth 2 (two) times daily., Disp: 180 tablet, Rfl: 3  Current Facility-Administered Medications:  .  0.9 %  sodium chloride infusion, 500 mL, Intravenous, Continuous, Iva BoopGessner, Carl E, MD  Past Medical History: Past Medical History:  Diagnosis Date  . ABNORMAL GLUCOSE NEC 08/24/2007   Qualifier: Diagnosis of  By: Plotnikov MD, Georgina QuintAleksei V   . Anemia 08/11/2017   Colon  2017 Dr Leone PayorGessner Start iron 2019 GI ref if Hgb not better  . Coronary artery disease 08/07/2018   April 2020  Sharl MaMarty developed chest pain or shortness of breath in the [employer] truck while going through [place] on July 29, 2018, Sunday.  His partner was driving.  They stopped and called the ambulance.  The chest pain was severe and lasted for about 20 minutes.  He was given nitroglycerin and aspirin.  [facility] in [place], [place] on April 20 where he was found t  . Diabetes mellitus 2011   type 2  . Dyslipidemia (high LDL; low HDL) 10/15/2010   Chronic Cardiac CT scoring info given 11/19  . Elevated PSA 09/06/2013   5/15 due to UTI Dr  Wrenn   . Essential hypertension 02/23/2007   Chronic Lotensin HCT, Coreg  Cardiac CT scoring test offered 11/19  . Finger pain, right 02/11/2011   10/12 R 4th prox phal - likely a partial flexor tendon rupture   . GERD 03/12/2007    On Omeprazole  . GERD (gastroesophageal reflux disease)   . Hypertension   . Hypogonadism male   . KNEE PAIN 03/12/2007   11/19 L   . Myocardial infarction [facility]) 08/07/2018   July 29, 2018  [name] developed chest pain or shortness of breath in the [employer] truck while going through [place] on July 29, 2018, Sunday.  His partner was driving.  They stopped and called the ambulance.  The chest pain was severe and lasted for about 20 minutes.   He was given nitroglycerin and aspirin.  They took him to [facility] in [place], [place] on April 20 where he was fou  . Obesity   . Osteoarthritis   . OSTEOARTHRITIS 03/12/2007   Chronic     . Phimosis 01/29/2016   10/17 candida due to [place] - tolerable  . UPPER RESPIRATORY INFECTION, ACUTE 06/11/2010   Qualifier: Diagnosis of  By: Posey Rea MD, Georgina Quint URTICARIA 06/05/2009   Qualifier: Diagnosis of  By: Posey Rea MD, Georgina Quint   . UTI (urinary tract infection) 09/06/2013   5/15 dr Annabell Howells   . Venous insufficiency 08/19/2011   B LE mild R>L   . Vitamin D deficiency 03/14/2007   Chronic       Tobacco Use: Social History   Tobacco Use  Smoking Status Never Smoker  Smokeless Tobacco Never Used    Labs: Recent Review Flowsheet Data    Labs for ITP Cardiac and Pulmonary Rehab Latest Ref Rng & Units 05/30/2016 08/20/2016 12/20/2016 11/11/2017 09/10/2018   Cholestrol <200 mg/dL - - 161 - 93   LDLCALC mg/dL (calc) - - 096 - 41   LDLDIRECT mg/dL - - - - -   HDL > OR = 40 mg/dL - - 04(V) - 40(J)   Trlycerides <150 mg/dL - - 811(B) - 147   Hemoglobin A1c - 6.7 6.8 7.2 6.7 -      Capillary Blood Glucose: Lab Results  Component Value Date   GLUCAP 104 (H) 10/18/2018     Exercise Target Goals: Exercise Program Goal: Individual exercise prescription set using results from initial 6 min walk test and THRR while considering  patient's activity barriers and safety.   Exercise Prescription Goal: Initial exercise prescription builds to 30-45 minutes a day of aerobic activity, 2-3 days per week.  Home exercise guidelines will be given to patient during program as part of exercise prescription that the participant will acknowledge.  Activity Barriers & Risk Stratification: Activity Barriers & Cardiac Risk Stratification - 10/18/18 1548      Activity Barriers & Cardiac Risk Stratification   Activity Barriers  None    Cardiac Risk Stratification  High       6 Minute  Walk: 6 Minute Walk    Row Name 10/18/18 1543         6 Minute Walk   Phase  Initial     Distance  1677 feet     Walk Time  6 minutes     # of Rest Breaks  0     MPH  3.18     METS  3.45     RPE  11     Perceived Dyspnea   0  VO2 Peak  12.06     Symptoms  No     Resting HR  70 bpm     Resting BP  98/68     Resting Oxygen Saturation   97 %     Exercise Oxygen Saturation  during 6 min walk  98 %     Max Ex. HR  105 bpm     Max Ex. BP  145/88     2 Minute Post BP  123/83        Oxygen Initial Assessment:   Oxygen Re-Evaluation:   Oxygen Discharge (Final Oxygen Re-Evaluation):   Initial Exercise Prescription: Initial Exercise Prescription - 10/18/18 1500      Date of Initial Exercise RX and Referring Provider   Date  10/18/18    Referring Provider  Armanda Magicurner, Traci R     Expected Discharge Date  11/30/18      Treadmill   MPH  2.6    Grade  1    Minutes  15    METs  3.35      NuStep   Level  2    SPM  85    Minutes  15    METs  3      Prescription Details   Frequency (times per week)  3x    Duration  Progress to 30 minutes of continuous aerobic without signs/symptoms of physical distress      Intensity   THRR 40-80% of Max Heartrate  63-126    Ratings of Perceived Exertion  11-13    Perceived Dyspnea  0-4      Progression   Progression  Continue progressive overload as per policy without signs/symptoms or physical distress.      Resistance Training   Training Prescription  Yes    Weight  5lbs    Reps  10-15       Perform Capillary Blood Glucose checks as needed.  Exercise Prescription Changes:   Exercise Comments:   Exercise Goals and Review: Exercise Goals    Row Name 10/18/18 1552             Exercise Goals   Increase Physical Activity  Yes       Intervention  Provide advice, education, support and counseling about physical activity/exercise needs.;Develop an individualized exercise prescription for aerobic and resistive  training based on initial evaluation findings, risk stratification, comorbidities and participant's personal goals.       Expected Outcomes  Short Term: Attend rehab on a regular basis to increase amount of physical activity.;Long Term: Add in home exercise to make exercise part of routine and to increase amount of physical activity.;Long Term: Exercising regularly at least 3-5 days a week.       Increase Strength and Stamina  Yes       Intervention  Provide advice, education, support and counseling about physical activity/exercise needs.;Develop an individualized exercise prescription for aerobic and resistive training based on initial evaluation findings, risk stratification, comorbidities and participant's personal goals.       Expected Outcomes  Short Term: Increase workloads from initial exercise prescription for resistance, speed, and METs.;Short Term: Perform resistance training exercises routinely during rehab and add in resistance training at home;Long Term: Improve cardiorespiratory fitness, muscular endurance and strength as measured by increased METs and functional capacity (6MWT)       Able to understand and use rate of perceived exertion (RPE) scale  Yes       Intervention  Provide education and  explanation on how to use RPE scale       Expected Outcomes  Short Term: Able to use RPE daily in rehab to express subjective intensity level;Long Term:  Able to use RPE to guide intensity level when exercising independently       Knowledge and understanding of Target Heart Rate Carson (THRR)  Yes       Intervention  Provide education and explanation of THRR including how the numbers were predicted and where they are located for reference       Expected Outcomes  Long Term: Able to use THRR to govern intensity when exercising independently;Short Term: Able to use daily as guideline for intensity in rehab;Short Term: Able to state/look up THRR       Able to check pulse independently  Yes        Intervention  Provide education and demonstration on how to check pulse in carotid and radial arteries.;Review the importance of being able to check your own pulse for safety during independent exercise       Expected Outcomes  Short Term: Able to explain why pulse checking is important during independent exercise;Long Term: Able to check pulse independently and accurately       Understanding of Exercise Prescription  Yes       Intervention  Provide education, explanation, and written materials on patient's individual exercise prescription       Expected Outcomes  Short Term: Able to explain program exercise prescription;Long Term: Able to explain home exercise prescription to exercise independently          Exercise Goals Re-Evaluation :   Discharge Exercise Prescription (Final Exercise Prescription Changes):   Nutrition:  Target Goals: Understanding of nutrition guidelines, daily intake of sodium 1500mg , cholesterol 200mg , calories 30% from fat and 7% or less from saturated fats, daily to have 5 or more servings of fruits and vegetables.  Biometrics: Pre Biometrics - 10/18/18 1548      Pre Biometrics   Height   (1.778 m)    Weight  124.7 kg    Waist Circumference  47.5 inches    Hip Circumference  52 inches    Waist to Hip Ratio  0.91 %    BMI (Calculated)  39.45    Triceps Skinfold  35 mm    % Body Fat  36 %    Grip Strength  46 kg    Flexibility  19 in    Single Leg Stand  5.18 seconds        Nutrition Therapy Plan and Nutrition Goals:   Nutrition Assessments:   Nutrition Goals Re-Evaluation:   Nutrition Goals Re-Evaluation:   Nutrition Goals Discharge (Final Nutrition Goals Re-Evaluation):   Psychosocial: Target Goals: Acknowledge presence or absence of significant depression and/or stress, maximize coping skills, provide positive support system. Participant is able to verbalize types and ability to use techniques and skills needed for reducing stress  and depression.  Initial Review & Psychosocial Screening: Initial Psych Review & Screening - 10/18/18 1607      Initial Review   Current issues with  None Identified      Family Dynamics   Good Support System?  Yes      Barriers   Psychosocial barriers to participate in program  There are no identifiable barriers or psychosocial needs.       Quality of Life Scores: Quality of Life - 10/18/18 1556      Quality of Life   Select  Quality  of Life      Quality of Life Scores   Health/Function Pre  21.63 %    Socioeconomic Pre  24.83 %    Psych/Spiritual Pre  23 %    Family Pre  22.8 %    GLOBAL Pre  22.68 %      Scores of 19 and below usually indicate a poorer quality of life in these areas.  A difference of  2-3 points is a clinically meaningful difference.  A difference of 2-3 points in the total score of the Quality of Life Index has been associated with significant improvement in overall quality of life, self-image, physical symptoms, and general health in studies assessing change in quality of life.  PHQ-9: Recent Review Flowsheet Data    Depression screen [facility] 2/9 11/17/2017 09/02/2016 09/25/2015   Decreased Interest 0 0 0   Down, Depressed, Hopeless 0 0 0   PHQ - 2 Score 0 0 0     Interpretation of Total Score  Total Score Depression Severity:  1-4 = Minimal depression, 5-9 = Mild depression, 10-14 = Moderate depression, 15-19 = Moderately severe depression, 20-27 = Severe depression   Psychosocial Evaluation and Intervention:   Psychosocial Re-Evaluation:   Psychosocial Discharge (Final Psychosocial Re-Evaluation):   Vocational Rehabilitation: Provide vocational rehab assistance to qualifying candidates.   Vocational Rehab Evaluation & Intervention: Vocational Rehab - 10/18/18 1607      Initial Vocational Rehab Evaluation & Intervention   Assessment shows need for Vocational Rehabilitation  No       Education: Education Goals: Education classes will be  provided on a weekly basis, covering required topics. Participant will state understanding/return demonstration of topics presented.  Learning Barriers/Preferences: Learning Barriers/Preferences - 10/18/18 1556      Learning Barriers/Preferences   Learning Barriers  None    Learning Preferences  Video;Skilled Demonstration;Verbal Instruction       Education Topics: Count Your Pulse:  -Group instruction provided by verbal instruction, demonstration, patient participation and written materials to support subject.  Instructors address importance of being able to find your pulse and how to count your pulse when at home without a heart monitor.  Patients get hands on experience counting their pulse with staff help and individually.   Heart Attack, Angina, and Risk Factor Modification:  -Group instruction provided by verbal instruction, video, and written materials to support subject.  Instructors address signs and symptoms of angina and heart attacks.    Also discuss risk factors for heart disease and how to make changes to improve heart health risk factors.   Functional Fitness:  -Group instruction provided by verbal instruction, demonstration, patient participation, and written materials to support subject.  Instructors address safety measures for doing things around the house.  Discuss how to get up and down off the floor, how to pick things up properly, how to safely get out of a chair without assistance, and balance training.   Meditation and Mindfulness:  -Group instruction provided by verbal instruction, patient participation, and written materials to support subject.  Instructor addresses importance of mindfulness and meditation practice to help reduce stress and improve awareness.  Instructor also leads participants through a meditation exercise.    Stretching for Flexibility and Mobility:  -Group instruction provided by verbal instruction, patient participation, and written  materials to support subject.  Instructors lead participants through series of stretches that are designed to increase flexibility thus improving mobility.  These stretches are additional exercise for major muscle groups that are typically performed  during regular warm up and cool down.   Hands Only CPR:  -Group verbal, video, and participation provides a basic overview of AHA guidelines for community CPR. Role-play of emergencies allow participants the opportunity to practice calling for help and chest compression technique with discussion of AED use.   Hypertension: -Group verbal and written instruction that provides a basic overview of hypertension including the most recent diagnostic guidelines, risk factor reduction with self-care instructions and medication management.    Nutrition I class: Heart Healthy Eating:  -Group instruction provided by PowerPoint slides, verbal discussion, and written materials to support subject matter. The instructor gives an explanation and review of the Therapeutic Lifestyle Changes diet recommendations, which includes a discussion on lipid goals, dietary fat, sodium, fiber, plant stanol/sterol esters, sugar, and the components of a well-balanced, healthy diet.   Nutrition II class: Lifestyle Skills:  -Group instruction provided by PowerPoint slides, verbal discussion, and written materials to support subject matter. The instructor gives an explanation and review of label reading, grocery shopping for heart health, heart healthy recipe modifications, and ways to make healthier choices when eating out.   Diabetes Question & Answer:  -Group instruction provided by PowerPoint slides, verbal discussion, and written materials to support subject matter. The instructor gives an explanation and review of diabetes co-morbidities, pre- and post-prandial blood glucose goals, pre-exercise blood glucose goals, signs, symptoms, and treatment of hypoglycemia and  hyperglycemia, and foot care basics.   Diabetes Blitz:  -Group instruction provided by PowerPoint slides, verbal discussion, and written materials to support subject matter. The instructor gives an explanation and review of the physiology behind type 1 and type 2 diabetes, diabetes medications and rational behind using different medications, pre- and post-prandial blood glucose recommendations and Hemoglobin A1c goals, diabetes diet, and exercise including blood glucose guidelines for exercising safely.    Portion Distortion:  -Group instruction provided by PowerPoint slides, verbal discussion, written materials, and food models to support subject matter. The instructor gives an explanation of serving size versus portion size, changes in portions sizes over the last 20 years, and what consists of a serving from each food group.   Stress Management:  -Group instruction provided by verbal instruction, video, and written materials to support subject matter.  Instructors review role of stress in heart disease and how to cope with stress positively.     Exercising on Your Own:  -Group instruction provided by verbal instruction, power point, and written materials to support subject.  Instructors discuss benefits of exercise, components of exercise, frequency and intensity of exercise, and end points for exercise.  Also discuss use of nitroglycerin and activating EMS.  Review options of places to exercise outside of rehab.  Review guidelines for sex with heart disease.   Cardiac Drugs I:  -Group instruction provided by verbal instruction and written materials to support subject.  Instructor reviews cardiac drug classes: antiplatelets, anticoagulants, beta blockers, and statins.  Instructor discusses reasons, side effects, and lifestyle considerations for each drug class.   Cardiac Drugs II:  -Group instruction provided by verbal instruction and written materials to support subject.  Instructor  reviews cardiac drug classes: angiotensin converting enzyme inhibitors (ACE-I), angiotensin II receptor blockers (ARBs), nitrates, and calcium channel blockers.  Instructor discusses reasons, side effects, and lifestyle considerations for each drug class.   Anatomy and Physiology of the Circulatory System:  Group verbal and written instruction and models provide basic cardiac anatomy and physiology, with the coronary electrical and arterial systems. Review of: AMI, Angina,  Valve disease, Heart Failure, Peripheral Artery Disease, Cardiac Arrhythmia, Pacemakers, and the ICD.   Other Education:  -Group or individual verbal, written, or video instructions that support the educational goals of the cardiac rehab program.   Holiday Eating Survival Tips:  -Group instruction provided by PowerPoint slides, verbal discussion, and written materials to support subject matter. The instructor gives patients tips, tricks, and techniques to help them not only survive but enjoy the holidays despite the onslaught of food that accompanies the holidays.   Knowledge Questionnaire Score: Knowledge Questionnaire Score - 10/18/18 1553      Knowledge Questionnaire Score   Pre Score  20/24       Core Components/Risk Factors/Patient Goals at Admission: Personal Goals and Risk Factors at Admission - 10/18/18 1555      Core Components/Risk Factors/Patient Goals on Admission   Hypertension  Yes    Intervention  Provide education on lifestyle modifcations including regular physical activity/exercise, weight management, moderate sodium restriction and increased consumption of fresh fruit, vegetables, and low fat dairy, alcohol moderation, and smoking cessation.;Monitor prescription use compliance.    Expected Outcomes  Short Term: Continued assessment and intervention until BP is < 140/19mm HG in hypertensive participants. < 130/81mm HG in hypertensive participants with diabetes, heart failure or chronic kidney  disease.;Long Term: Maintenance of blood pressure at goal levels.    Lipids  Yes    Intervention  Provide education and support for participant on nutrition & aerobic/resistive exercise along with prescribed medications to achieve LDL 70mg , HDL >40mg .    Expected Outcomes  Short Term: Participant states understanding of desired cholesterol values and is compliant with medications prescribed. Participant is following exercise prescription and nutrition guidelines.;Long Term: Cholesterol controlled with medications as prescribed, with individualized exercise RX and with personalized nutrition plan. Value goals: LDL < 70mg , HDL > 40 mg.       Core Components/Risk Factors/Patient Goals Review:    Core Components/Risk Factors/Patient Goals at Discharge (Final Review):    ITP Comments: ITP Comments    Row Name 10/18/18 1433           ITP Comments  Dr. Fransico Him, Medical Director          Comments: Patient attended orientation on 10/18/2018 to review rules and guidelines for program.  Completed 6 minute walk test, Intitial ITP, and exercise prescription.  VSS. Telemetry-SR.  Asymptomatic. Safety measures and social distancing in place per CDC guidelines.

## 2018-10-22 ENCOUNTER — Other Ambulatory Visit: Payer: Self-pay

## 2018-10-22 ENCOUNTER — Encounter (HOSPITAL_COMMUNITY)
Admission: RE | Admit: 2018-10-22 | Discharge: 2018-10-22 | Disposition: A | Discharge status: Home / Self Care | Payer: BC Managed Care – PPO | Source: Ambulatory Visit | Attending: Cardiology | Admitting: Cardiology

## 2018-10-22 DIAGNOSIS — I214 Non-ST elevation (NSTEMI) myocardial infarction: Secondary | ICD-10-CM

## 2018-10-22 DIAGNOSIS — Z955 Presence of coronary angioplasty implant and graft: Secondary | ICD-10-CM

## 2018-10-22 LAB — GLUCOSE, CAPILLARY
Glucose-Capillary: 177 mg/dL — ABNORMAL HIGH (ref 70–99)
Glucose-Capillary: 104 mg/dL — ABNORMAL HIGH (ref 70–99)

## 2018-10-22 NOTE — Progress Notes (Signed)
Daily Session Note  Patient Details  Name: Andrew Carson MRN: [id number] Date of Birth: Aug 23, 1955 Referring Provider:     CARDIAC REHAB PHASE II ORIENTATION from 10/18/2018 in [place]  Referring Provider  Sueanne Margarita       Encounter Date: 10/22/2018  Check In: Session Check In - 10/22/18 1049      Check-In   Supervising physician immediately available to respond to emergencies  [facility] immediately available    Physician(s)  Dr. Louanne Belton    Location  [facility]    Staff Present  Hoy Register, MS, Exercise Physiologist;Olinty Celesta Aver, MS, ACSM CEP, Exercise Physiologist;Brittany Durene Fruits, BS, ACSM CEP, Exercise Physiologist;Tonya Wantz Karle Starch, RN, BSN    Resistance Training Performed  Yes    VAD Patient?  No    PAD/SET Patient?  No      Pain Assessment   Currently in Pain?  No/denies    Multiple Pain Sites  No       Capillary Blood Glucose: Results for orders placed or performed during the hospital encounter of 10/22/18 (from the past 24 hour(s))  Glucose, capillary     Status: Abnormal   Collection Time: 10/22/18 10:33 AM  Result Value Ref Range   Glucose-Capillary 177 (H) 70 - 99 mg/dL  Glucose, capillary     Status: Abnormal   Collection Time: 10/22/18 11:44 AM  Result Value Ref Range   Glucose-Capillary 104 (H) 70 - 99 mg/dL      Social History   Tobacco Use  Smoking Status Never Smoker  Smokeless Tobacco Never Used    Goals Met:  Exercise tolerated well  Goals Unmet:  Not Applicable  Comments: Pt started cardiac rehab today.  Pt tolerated light exercise without difficulty. VSS, telemetry-SR, asymptomatic.  Medication list reconciled. Pt denies barriers to medicaiton compliance.  PSYCHOSOCIAL ASSESSMENT:  PHQ-0. Pt exhibits positive coping skills, hopeful outlook with supportive family. No psychosocial needs identified at this time, no psychosocial interventions necessary.  Pt oriented to exercise  equipment and routine.    Understanding verbalized.    Dr. Fransico Him is Medical Director for Cardiac Rehab at [facility].

## 2018-10-24 ENCOUNTER — Encounter (HOSPITAL_COMMUNITY): Payer: BC Managed Care – PPO

## 2018-10-26 ENCOUNTER — Other Ambulatory Visit: Payer: Self-pay

## 2018-10-26 ENCOUNTER — Encounter (HOSPITAL_COMMUNITY)
Admission: RE | Admit: 2018-10-26 | Discharge: 2018-10-26 | Disposition: A | Discharge status: Home / Self Care | Payer: BC Managed Care – PPO | Source: Ambulatory Visit | Attending: Cardiology | Admitting: Cardiology

## 2018-10-26 DIAGNOSIS — I214 Non-ST elevation (NSTEMI) myocardial infarction: Secondary | ICD-10-CM

## 2018-10-26 DIAGNOSIS — Z955 Presence of coronary angioplasty implant and graft: Secondary | ICD-10-CM

## 2018-10-26 LAB — GLUCOSE, CAPILLARY
Glucose-Capillary: 122 mg/dL — ABNORMAL HIGH (ref 70–99)
Glucose-Capillary: 104 mg/dL — ABNORMAL HIGH (ref 70–99)

## 2018-10-29 ENCOUNTER — Other Ambulatory Visit: Payer: Self-pay

## 2018-10-29 ENCOUNTER — Encounter (HOSPITAL_COMMUNITY)
Admission: RE | Admit: 2018-10-29 | Discharge: 2018-10-29 | Disposition: A | Discharge status: Home / Self Care | Payer: BC Managed Care – PPO | Source: Ambulatory Visit | Attending: Cardiology | Admitting: Cardiology

## 2018-10-29 DIAGNOSIS — I214 Non-ST elevation (NSTEMI) myocardial infarction: Secondary | ICD-10-CM

## 2018-10-29 DIAGNOSIS — Z955 Presence of coronary angioplasty implant and graft: Secondary | ICD-10-CM

## 2018-10-31 ENCOUNTER — Encounter (HOSPITAL_COMMUNITY): Payer: BC Managed Care – PPO

## 2018-11-01 NOTE — Progress Notes (Signed)
Cardiac Individual Treatment Plan  Patient Details  Name: Andrew Carson MRN: [id number] Date of Birth: 1955/06/23 Referring Provider:     CARDIAC REHAB PHASE II ORIENTATION [facility] 10/18/2018 [facility]  Referring Provider  Mayford Knifeurner, Ohioraci R       Initial Encounter Date:    CARDIAC REHAB PHASE II ORIENTATION from 10/18/2018 in [facility]  Date  10/18/18      Visit Diagnosis: NSTEMI (non-ST elevated myocardial infarction) [facility](HCC)  Status post coronary artery stent placement  Patient's Home Medications on Admission:  Current Outpatient Medications:  .  aspirin EC 81 MG tablet, Take 1 tablet (81 mg total) by mouth daily., Disp: 100 tablet, Rfl: 3 .  atorvastatin (LIPITOR) 80 MG tablet, Take 1 tablet (80 mg total) by mouth daily., Disp: 90 tablet, Rfl: 3 .  benazepril-hydrochlorthiazide (LOTENSIN HCT) 20-12.5 MG tablet, Take 1 tablet by mouth daily., Disp: 90 tablet, Rfl: 3 .  carvedilol (COREG) 12.5 MG tablet, Take 1 tablet (12.5 mg total) by mouth 2 (two) times daily with a meal., Disp: 180 tablet, Rfl: 3 .  [employer] D3 25 MCG (1000 UT) capsule, TAKE 1 CAPSULE BY MOUTH EVERY DAY, Disp: 90 capsule, Rfl: 3 .  FARXIGA 10 MG TABS tablet, TAKE 1 TABLET BY MOUTH EVERY DAY, Disp: 90 tablet, Rfl: 3 .  ferrous sulfate 325 (65 FE) MG tablet, TAKE 1 TABLET BY MOUTH EVERY DAY, Disp: 90 tablet, Rfl: 1 .  glucose blood test strip, 1 each by Other route 2 (two) times daily. Use as instructed , Disp: , Rfl:  .  ketoconazole (NIZORAL) 2 % cream, Apply 1 application topically as needed for irritation., Disp: , Rfl:  .  mometasone (ELOCON) 0.1 % cream, APPLY TO AFFECTED AREA EVERY DAY, Disp: 45 g, Rfl: 3 .  nitroGLYCERIN (NITROSTAT) 0.4 MG SL tablet, Place 0.4 mg under the tongue every 5 (five) minutes as needed for chest pain., Disp: , Rfl:  .  omeprazole (PRILOSEC) 20 MG capsule, TAKE 1 CAPSULE BY MOUTH EVERY DAY, Disp: 90 capsule, Rfl: 3 .   sitaGLIPtin-metformin (JANUMET) 50-1000 MG tablet, Take 1 tablet by mouth 2 (two) times daily with a meal., Disp: 180 tablet, Rfl: 1 .  ticagrelor (BRILINTA) 90 MG TABS tablet, Take 1 tablet (90 mg total) by mouth 2 (two) times daily., Disp: 180 tablet, Rfl: 3  Current Facility-Administered Medications:  .  0.9 %  sodium chloride infusion, 500 mL, Intravenous, Continuous, Iva BoopGessner, Carl E, MD  Past Medical History: Past Medical History:  Diagnosis Date  . ABNORMAL GLUCOSE NEC 08/24/2007   Qualifier: Diagnosis of  By: Plotnikov MD, Georgina QuintAleksei V   . Anemia 08/11/2017   Colon  2017 Dr Leone PayorGessner Start iron 2019 GI ref if Hgb not better  . Coronary artery disease 08/07/2018   April 2020  Andrew MaMarty developed chest pain or shortness of breath in the [employer] truck while going through [place] on July 29, 2018, Sunday.  His partner was driving.  They stopped and called the ambulance.  The chest pain was severe and lasted for about 20 minutes.  He was given nitroglycerin and aspirin.  [facility] to [facility] in [place], [place] on April 20 where he was found t  . Diabetes mellitus 2011   type 2  . Dyslipidemia (high LDL; low HDL) 10/15/2010   Chronic Cardiac CT scoring info given 11/19  . Elevated PSA 09/06/2013   5/15 due to UTI Dr  Wrenn   . Essential hypertension 02/23/2007   Chronic Lotensin HCT, Coreg  Cardiac CT scoring test offered 11/19  . Finger pain, right 02/11/2011   10/12 R 4th prox phal - likely a partial flexor tendon rupture   . GERD 03/12/2007    On Omeprazole  . GERD (gastroesophageal reflux disease)   . Hypertension   . Hypogonadism male   . KNEE PAIN 03/12/2007   11/19 L   . Myocardial infarction [facility](HCC) 08/07/2018   July 29, 2018  Andrew MaMarty developed chest pain or shortness of breath in the [employer] truck while going through [place] on July 29, 2018, Sunday.  His partner was driving.  They stopped and called the ambulance.  The chest pain was severe and lasted for about 20 minutes.   He was given nitroglycerin and aspirin.  [facility] to [facility] in [place], [place] on April 20 where he was fou  . Obesity   . Osteoarthritis   . OSTEOARTHRITIS 03/12/2007   Chronic     . Phimosis 01/29/2016   10/17 candida due to [place] - tolerable  . UPPER RESPIRATORY INFECTION, ACUTE 06/11/2010   Qualifier: Diagnosis of  By: Posey ReaPlotnikov MD, Georgina QuintAleksei V   . URTICARIA 06/05/2009   Qualifier: Diagnosis of  By: Posey ReaPlotnikov MD, Georgina QuintAleksei V   . UTI (urinary tract infection) 09/06/2013   5/15 dr Annabell HowellsWrenn   . Venous insufficiency 08/19/2011   B LE mild R>L   . Vitamin D deficiency 03/14/2007   Chronic       Tobacco Use: Social History   Tobacco Use  Smoking Status Never Smoker  Smokeless Tobacco Never Used    Labs: Recent Review Flowsheet Data    Labs for ITP Cardiac and Pulmonary Rehab Latest Ref Rng & Units 05/30/2016 08/20/2016 12/20/2016 11/11/2017 09/10/2018   Cholestrol <200 mg/dL - - 132200 - 93   LDLCALC mg/dL (calc) - - 440136 - 41   LDLDIRECT mg/dL - - - - -   HDL > OR = 40 mg/dL - - 10(U32(A) - 72(Z30(L)   Trlycerides <150 mg/dL - - 366(Y187(A) - 403132   Hemoglobin A1c - 6.7 6.8 7.2 6.7 -      Capillary Blood Glucose: Lab Results  Component Value Date   GLUCAP 104 (H) 10/26/2018   GLUCAP 122 (H) 10/26/2018   GLUCAP 104 (H) 10/22/2018   GLUCAP 177 (H) 10/22/2018   GLUCAP 104 (H) 10/18/2018     Exercise Target Goals: Exercise Program Goal: Individual exercise prescription set using results from initial 6 min walk test and THRR while considering  patient's activity barriers and safety.   Exercise Prescription Goal: Initial exercise prescription builds to 30-45 minutes a day of aerobic activity, 2-3 days per week.  Home exercise guidelines will be given to patient during program as part of exercise prescription that the participant will acknowledge.  Activity Barriers & Risk Stratification: Activity Barriers & Cardiac Risk Stratification - 10/18/18 1548      Activity Barriers  & Cardiac Risk Stratification   Activity Barriers  None    Cardiac Risk Stratification  High       6 Minute Walk: 6 Minute Walk    Row Name 10/18/18 1543         6 Minute Walk   Phase  Initial     Distance  1677 feet     Walk Time  6 minutes     # of Rest Breaks  0     MPH  3.18     METS  3.45     RPE  11     Perceived Dyspnea   0     VO2 Peak  12.06     Symptoms  No     Resting HR  70 bpm     Resting BP  98/68     Resting Oxygen Saturation   97 %     Exercise Oxygen Saturation  during 6 min walk  98 %     Max Ex. HR  105 bpm     Max Ex. BP  145/88     2 Minute Post BP  123/83        Oxygen Initial Assessment:   Oxygen Re-Evaluation:   Oxygen Discharge (Final Oxygen Re-Evaluation):   Initial Exercise Prescription: Initial Exercise Prescription - 10/18/18 1500      Date of Initial Exercise RX and Referring Provider   Date  10/18/18    Referring Provider  Armanda Magicurner, Traci R     Expected Discharge Date  11/30/18      Treadmill   MPH  2.6    Grade  1    Minutes  15    METs  3.35      NuStep   Level  2    SPM  85    Minutes  15    METs  3      Prescription Details   Frequency (times per week)  3x    Duration  Progress to 30 minutes of continuous aerobic without signs/symptoms of physical distress      Intensity   THRR 40-80% of Max Heartrate  63-126    Ratings of Perceived Exertion  11-13    Perceived Dyspnea  0-4      Progression   Progression  Continue progressive overload as per policy without signs/symptoms or physical distress.      Resistance Training   Training Prescription  Yes    Weight  5lbs    Reps  10-15       Perform Capillary Blood Glucose checks as needed.  Exercise Prescription Changes: Exercise Prescription Changes    Row Name 10/22/18 1200             Response to Exercise   Blood Pressure (Admit)  124/70       Blood Pressure (Exercise)  150/72       Blood Pressure (Exit)  110/68       Heart Rate (Admit)  78 bpm        Heart Rate (Exercise)  103 bpm       Heart Rate (Exit)  72 bpm       Rating of Perceived Exertion (Exercise)  12       Symptoms  none       Comments  Pt first day of exercise       Duration  Continue with 30 min of aerobic exercise without signs/symptoms of physical distress.       Intensity  THRR unchanged         Progression   Progression  Continue to progress workloads to maintain intensity without signs/symptoms of physical distress.       Average METs  2.9         Resistance Training   Training Prescription  Yes       Weight  5lbs       Reps  10-15       Time  10 Minutes  Treadmill   MPH  2.8       Grade  1       Minutes  15       METs  3.35         NuStep   Level  3       SPM  85       Minutes  15       METs  2.6          Exercise Comments: Exercise Comments    Row Name 10/22/18 1208           Exercise Comments  Pt first day of exercise. Tolerated exercise well.          Exercise Goals and Review: Exercise Goals    Row Name 10/18/18 1552             Exercise Goals   Increase Physical Activity  Yes       Intervention  Provide advice, education, support and counseling about physical activity/exercise needs.;Develop an individualized exercise prescription for aerobic and resistive training based on initial evaluation findings, risk stratification, comorbidities and participant's personal goals.       Expected Outcomes  Short Term: Attend rehab on a regular basis to increase amount of physical activity.;Long Term: Add in home exercise to make exercise part of routine and to increase amount of physical activity.;Long Term: Exercising regularly at least 3-5 days a week.       Increase Strength and Stamina  Yes       Intervention  Provide advice, education, support and counseling about physical activity/exercise needs.;Develop an individualized exercise prescription for aerobic and resistive training based on initial evaluation findings, risk  stratification, comorbidities and participant's personal goals.       Expected Outcomes  Short Term: Increase workloads from initial exercise prescription for resistance, speed, and METs.;Short Term: Perform resistance training exercises routinely during rehab and add in resistance training at home;Long Term: Improve cardiorespiratory fitness, muscular endurance and strength as measured by increased METs and functional capacity (6MWT)       Able to understand and use rate of perceived exertion (RPE) scale  Yes       Intervention  Provide education and explanation on how to use RPE scale       Expected Outcomes  Short Term: Able to use RPE daily in rehab to express subjective intensity level;Long Term:  Able to use RPE to guide intensity level when exercising independently       Knowledge and understanding of Target Heart Rate Range (THRR)  Yes       Intervention  Provide education and explanation of THRR including how the numbers were predicted and where they are located for reference       Expected Outcomes  Long Term: Able to use THRR to govern intensity when exercising independently;Short Term: Able to use daily as guideline for intensity in rehab;Short Term: Able to state/look up THRR       Able to check pulse independently  Yes       Intervention  Provide education and demonstration on how to check pulse in carotid and radial arteries.;Review the importance of being able to check your own pulse for safety during independent exercise       Expected Outcomes  Short Term: Able to explain why pulse checking is important during independent exercise;Long Term: Able to check pulse independently and accurately       Understanding of Exercise Prescription  Yes  Intervention  Provide education, explanation, and written materials on patient's individual exercise prescription       Expected Outcomes  Short Term: Able to explain program exercise prescription;Long Term: Able to explain home exercise  prescription to exercise independently          Exercise Goals Re-Evaluation : Exercise Goals Re-Evaluation    Row Name 10/22/18 1206             Exercise Goal Re-Evaluation   Exercise Goals Review  Increase Physical Activity;Increase Strength and Stamina;Able to understand and use rate of perceived exertion (RPE) scale;Knowledge and understanding of Target Heart Rate Range (THRR);Able to check pulse independently;Understanding of Exercise Prescription       Comments  Pt first day of exercise. Pt tolerated exercise prescription. Pt increased speed on treadmill and tolerated it well.       Expected Outcomes  Will continue to monitor and progress Pt as tolerated.          Discharge Exercise Prescription (Final Exercise Prescription Changes): Exercise Prescription Changes - 10/22/18 1200      Response to Exercise   Blood Pressure (Admit)  124/70    Blood Pressure (Exercise)  150/72    Blood Pressure (Exit)  110/68    Heart Rate (Admit)  78 bpm    Heart Rate (Exercise)  103 bpm    Heart Rate (Exit)  72 bpm    Rating of Perceived Exertion (Exercise)  12    Symptoms  none    Comments  Pt first day of exercise    Duration  Continue with 30 min of aerobic exercise without signs/symptoms of physical distress.    Intensity  THRR unchanged      Progression   Progression  Continue to progress workloads to maintain intensity without signs/symptoms of physical distress.    Average METs  2.9      Resistance Training   Training Prescription  Yes    Weight  5lbs    Reps  10-15    Time  10 Minutes      Treadmill   MPH  2.8    Grade  1    Minutes  15    METs  3.35      NuStep   Level  3    SPM  85    Minutes  15    METs  2.6       Nutrition:  Target Goals: Understanding of nutrition guidelines, daily intake of sodium 1500mg , cholesterol 200mg , calories 30% from fat and 7% or less from saturated fats, daily to have 5 or more servings of fruits and  vegetables.  Biometrics: Pre Biometrics - 10/18/18 1548      Pre Biometrics   Height  5\' 10"  (1.778 m)    Weight  124.7 kg    Waist Circumference  47.5 inches    Hip Circumference  52 inches    Waist to Hip Ratio  0.91 %    BMI (Calculated)  39.45    Triceps Skinfold  35 mm    % Body Fat  36 %    Grip Strength  46 kg    Flexibility  19 in    Single Leg Stand  5.18 seconds        Nutrition Therapy Plan and Nutrition Goals:   Nutrition Assessments:   Nutrition Goals Re-Evaluation:   Nutrition Goals Re-Evaluation:   Nutrition Goals Discharge (Final Nutrition Goals Re-Evaluation):   Psychosocial: Target Goals: Acknowledge presence or  absence of significant depression and/or stress, maximize coping skills, provide positive support system. Participant is able to verbalize types and ability to use techniques and skills needed for reducing stress and depression.  Initial Review & Psychosocial Screening: Initial Psych Review & Screening - 10/18/18 1607      Initial Review   Current issues with  None Identified      Family Dynamics   Good Support System?  Yes      Barriers   Psychosocial barriers to participate in program  There are no identifiable barriers or psychosocial needs.       Quality of Life Scores: Quality of Life - 10/18/18 1556      Quality of Life   Select  Quality of Life      Quality of Life Scores   Health/Function Pre  21.63 %    Socioeconomic Pre  24.83 %    Psych/Spiritual Pre  23 %    Family Pre  22.8 %    GLOBAL Pre  22.68 %      Scores of 19 and below usually indicate a poorer quality of life in these areas.  A difference of  2-3 points is a clinically meaningful difference.  A difference of 2-3 points in the total score of the Quality of Life Index has been associated with significant improvement in overall quality of life, self-image, physical symptoms, and general health in studies assessing change in quality of  life.  PHQ-9: Recent Review Flowsheet Data    Depression screen [facility] 2/9 10/22/2018 11/17/2017 09/02/2016 09/25/2015   Decreased Interest 0 0 0 0   Down, Depressed, Hopeless 0 0 0 0   PHQ - 2 Score 0 0 0 0     Interpretation of Total Score  Total Score Depression Severity:  1-4 = Minimal depression, 5-9 = Mild depression, 10-14 = Moderate depression, 15-19 = Moderately severe depression, 20-27 = Severe depression   Psychosocial Evaluation and Intervention: Psychosocial Evaluation - 10/22/18 1153      Psychosocial Evaluation & Interventions   Interventions  Encouraged to exercise with the program and follow exercise prescription    Comments  No psychosocial needs identified.    Expected Outcomes  Andrew Carson will maintain a positive outlook utilizing good coping skills.    Continue Psychosocial Services   No Follow up required       Psychosocial Re-Evaluation: Psychosocial Re-Evaluation    Row Name 10/29/18 [phone]             Psychosocial Re-Evaluation   Current issues with  None Identified       Comments  No psychosocial needs identified.       Expected Outcomes  Andrew Carson will maintain a positive outlook with good coping skills.       Interventions  Encouraged to attend Cardiac Rehabilitation for the exercise       Continue Psychosocial Services   No Follow up required          Psychosocial Discharge (Final Psychosocial Re-Evaluation): Psychosocial Re-Evaluation - 10/29/18 0917      Psychosocial Re-Evaluation   Current issues with  None Identified    Comments  No psychosocial needs identified.    Expected Outcomes  Andrew Carson will maintain a positive outlook with good coping skills.    Interventions  Encouraged to attend Cardiac Rehabilitation for the exercise    Continue Psychosocial Services   No Follow up required       Vocational Rehabilitation: Provide vocational rehab assistance to qualifying  candidates.   Vocational Rehab Evaluation & Intervention: Vocational Rehab -  10/18/18 1607      Initial Vocational Rehab Evaluation & Intervention   Assessment shows need for Vocational Rehabilitation  No       Education: Education Goals: Education classes will be provided on a weekly basis, covering required topics. Participant will state understanding/return demonstration of topics presented.  Learning Barriers/Preferences: Learning Barriers/Preferences - 10/18/18 1556      Learning Barriers/Preferences   Learning Barriers  None    Learning Preferences  Video;Skilled Demonstration;Verbal Instruction       Education Topics: Count Your Pulse:  -Group instruction provided by verbal instruction, demonstration, patient participation and written materials to support subject.  Instructors address importance of being able to find your pulse and how to count your pulse when at home without a heart monitor.  Patients get hands on experience counting their pulse with staff help and individually.   Heart Attack, Angina, and Risk Factor Modification:  -Group instruction provided by verbal instruction, video, and written materials to support subject.  Instructors address signs and symptoms of angina and heart attacks.    Also discuss risk factors for heart disease and how to make changes to improve heart health risk factors.   Functional Fitness:  -Group instruction provided by verbal instruction, demonstration, patient participation, and written materials to support subject.  Instructors address safety measures for doing things around the house.  Discuss how to get up and down off the floor, how to pick things up properly, how to safely get out of a chair without assistance, and balance training.   Meditation and Mindfulness:  -Group instruction provided by verbal instruction, patient participation, and written materials to support subject.  Instructor addresses importance of mindfulness and meditation practice to help reduce stress and improve awareness.  Instructor  also leads participants through a meditation exercise.    Stretching for Flexibility and Mobility:  -Group instruction provided by verbal instruction, patient participation, and written materials to support subject.  Instructors lead participants through series of stretches that are designed to increase flexibility thus improving mobility.  These stretches are additional exercise for major muscle groups that are typically performed during regular warm up and cool down.   Hands Only CPR:  -Group verbal, video, and participation provides a basic overview of AHA guidelines for community CPR. Role-play of emergencies allow participants the opportunity to practice calling for help and chest compression technique with discussion of AED use.   Hypertension: -Group verbal and written instruction that provides a basic overview of hypertension including the most recent diagnostic guidelines, risk factor reduction with self-care instructions and medication management.    Nutrition I class: Heart Healthy Eating:  -Group instruction provided by PowerPoint slides, verbal discussion, and written materials to support subject matter. The instructor gives an explanation and review of the Therapeutic Lifestyle Changes diet recommendations, which includes a discussion on lipid goals, dietary fat, sodium, fiber, plant stanol/sterol esters, sugar, and the components of a well-balanced, healthy diet.   Nutrition II class: Lifestyle Skills:  -Group instruction provided by PowerPoint slides, verbal discussion, and written materials to support subject matter. The instructor gives an explanation and review of label reading, grocery shopping for heart health, heart healthy recipe modifications, and ways to make healthier choices when eating out.   Diabetes Question & Answer:  -Group instruction provided by PowerPoint slides, verbal discussion, and written materials to support subject matter. The instructor gives an  explanation and review of diabetes co-morbidities,  pre- and post-prandial blood glucose goals, pre-exercise blood glucose goals, signs, symptoms, and treatment of hypoglycemia and hyperglycemia, and foot care basics.   Diabetes Blitz:  -Group instruction provided by PowerPoint slides, verbal discussion, and written materials to support subject matter. The instructor gives an explanation and review of the physiology behind type 1 and type 2 diabetes, diabetes medications and rational behind using different medications, pre- and post-prandial blood glucose recommendations and Hemoglobin A1c goals, diabetes diet, and exercise including blood glucose guidelines for exercising safely.    Portion Distortion:  -Group instruction provided by PowerPoint slides, verbal discussion, written materials, and food models to support subject matter. The instructor gives an explanation of serving size versus portion size, changes in portions sizes over the last 20 years, and what consists of a serving from each food group.   Stress Management:  -Group instruction provided by verbal instruction, video, and written materials to support subject matter.  Instructors review role of stress in heart disease and how to cope with stress positively.     Exercising on Your Own:  -Group instruction provided by verbal instruction, power point, and written materials to support subject.  Instructors discuss benefits of exercise, components of exercise, frequency and intensity of exercise, and end points for exercise.  Also discuss use of nitroglycerin and activating EMS.  Review options of places to exercise outside of rehab.  Review guidelines for sex with heart disease.   Cardiac Drugs I:  -Group instruction provided by verbal instruction and written materials to support subject.  Instructor reviews cardiac drug classes: antiplatelets, anticoagulants, beta blockers, and statins.  Instructor discusses reasons, side effects, and  lifestyle considerations for each drug class.   Cardiac Drugs II:  -Group instruction provided by verbal instruction and written materials to support subject.  Instructor reviews cardiac drug classes: angiotensin converting enzyme inhibitors (ACE-I), angiotensin II receptor blockers (ARBs), nitrates, and calcium channel blockers.  Instructor discusses reasons, side effects, and lifestyle considerations for each drug class.   Anatomy and Physiology of the Circulatory System:  Group verbal and written instruction and models provide basic cardiac anatomy and physiology, with the coronary electrical and arterial systems. Review of: AMI, Angina, Valve disease, Heart Failure, Peripheral Artery Disease, Cardiac Arrhythmia, Pacemakers, and the ICD.   Other Education:  -Group or individual verbal, written, or video instructions that support the educational goals of the cardiac rehab program.   Holiday Eating Survival Tips:  -Group instruction provided by PowerPoint slides, verbal discussion, and written materials to support subject matter. The instructor gives patients tips, tricks, and techniques to help them not only survive but enjoy the holidays despite the onslaught of food that accompanies the holidays.   Knowledge Questionnaire Score: Knowledge Questionnaire Score - 10/18/18 1553      Knowledge Questionnaire Score   Pre Score  20/24       Core Components/Risk Factors/Patient Goals at Admission: Personal Goals and Risk Factors at Admission - 10/18/18 1555      Core Components/Risk Factors/Patient Goals on Admission   Hypertension  Yes    Intervention  Provide education on lifestyle modifcations including regular physical activity/exercise, weight management, moderate sodium restriction and increased consumption of fresh fruit, vegetables, and low fat dairy, alcohol moderation, and smoking cessation.;Monitor prescription use compliance.    Expected Outcomes  Short Term: Continued  assessment and intervention until BP is < 140/6mm HG in hypertensive participants. < 130/32mm HG in hypertensive participants with diabetes, heart failure or chronic kidney disease.;Long Term: Maintenance of  blood pressure at goal levels.    Lipids  Yes    Intervention  Provide education and support for participant on nutrition & aerobic/resistive exercise along with prescribed medications to achieve LDL 70mg , HDL >40mg .    Expected Outcomes  Short Term: Participant states understanding of desired cholesterol values and is compliant with medications prescribed. Participant is following exercise prescription and nutrition guidelines.;Long Term: Cholesterol controlled with medications as prescribed, with individualized exercise RX and with personalized nutrition plan. Value goals: LDL < , HDL > 40 mg.       Core Components/Risk Factors/Patient Goals Review:  Goals and Risk Factor Review    Row Name 10/22/18 1154 10/29/18 0917           Core Components/Risk Factors/Patient Goals Review   Personal Goals Review  Hypertension;Lipids  Hypertension;Lipids      Review  Pt with few CAD RFs willing to participate in CR exercise.  Andrew Carson would like to lose weight and establish an exercise routine.  Pt with few CAD RFs willing to participate in CR exercise.  Andrew Carson is tolerating exercise well during his first week of CR.      Expected Outcomes  Pt will continue to participate in CR exercise, nutrition, and lifestyle modification opportunities.  Pt will continue to participate in CR exercise, nutrition, and lifestyle modification opportunities.         Core Components/Risk Factors/Patient Goals at Discharge (Final Review):  Goals and Risk Factor Review - 10/29/18 0917      Core Components/Risk Factors/Patient Goals Review   Personal Goals Review  Hypertension;Lipids    Review  Pt with few CAD RFs willing to participate in CR exercise.  Andrew Carson is tolerating exercise well during his first week of CR.     Expected Outcomes  Pt will continue to participate in CR exercise, nutrition, and lifestyle modification opportunities.       ITP Comments: ITP Comments    Row Name 10/18/18 1433 10/22/18 1153 10/29/18 0916       ITP Comments  Dr. Armanda Magic, Medical Director  Pt started exercise today and tolerated it well.  30 Day ITP Review.  Andrew Carson is tolerating exercise well. VSS.        Comments: See ITP Comments.

## 2018-11-02 ENCOUNTER — Other Ambulatory Visit: Payer: Self-pay

## 2018-11-02 ENCOUNTER — Encounter (HOSPITAL_COMMUNITY)
Admission: RE | Admit: 2018-11-02 | Discharge: 2018-11-02 | Disposition: A | Discharge status: Home / Self Care | Payer: BC Managed Care – PPO | Source: Ambulatory Visit | Attending: Cardiology | Admitting: Cardiology

## 2018-11-02 ENCOUNTER — Telehealth: Payer: Self-pay | Admitting: Internal Medicine

## 2018-11-02 DIAGNOSIS — I214 Non-ST elevation (NSTEMI) myocardial infarction: Secondary | ICD-10-CM | POA: Diagnosis not present

## 2018-11-02 DIAGNOSIS — Z955 Presence of coronary angioplasty implant and graft: Secondary | ICD-10-CM

## 2018-11-02 MED ORDER — BAYER MICROLET LANCETS MISC: 12 refills | Status: AC

## 2018-11-02 MED ORDER — CONTOUR NEXT TEST VI STRP: ORAL_STRIP | 12 refills | Status: AC

## 2018-11-02 NOTE — Telephone Encounter (Signed)
Medication Refill - Medication: glucose blood test strip [[id number]]   Contour NEXT strips   Has the patient contacted their pharmacy? No. (Agent: If no, request that the patient contact the pharmacy for the refill.) (Agent: If yes, when and what did the pharmacy advise?)  Preferred Pharmacy (with phone number or street name):  [id number] - , [place] [phone] (Phone)     Agent: Please be advised that RX refills may take up to 3 business days. We ask that you follow-up with your pharmacy.

## 2018-11-02 NOTE — Telephone Encounter (Signed)
Reviewed chart pt is up-to-date sent refills to [facility].../lmb  

## 2018-11-05 ENCOUNTER — Other Ambulatory Visit: Payer: Self-pay

## 2018-11-05 ENCOUNTER — Encounter (HOSPITAL_COMMUNITY)
Admission: RE | Admit: 2018-11-05 | Discharge: 2018-11-05 | Disposition: A | Discharge status: Home / Self Care | Payer: BC Managed Care – PPO | Source: Ambulatory Visit | Attending: Cardiology | Admitting: Cardiology

## 2018-11-05 DIAGNOSIS — I214 Non-ST elevation (NSTEMI) myocardial infarction: Secondary | ICD-10-CM

## 2018-11-05 DIAGNOSIS — Z955 Presence of coronary angioplasty implant and graft: Secondary | ICD-10-CM

## 2018-11-05 NOTE — Progress Notes (Signed)
Reviewed home exercise guidelines with patient including endpoints, temperature precautions, target heart rate and rate of perceived exertion. 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 Pt will try to add at least one day walking in addition to to his exercise at cardiac rehab as his mode of home exercise. Pt voices understanding of instructions given.  Sol Passer, MS, ACSM CEP

## 2018-11-07 ENCOUNTER — Encounter (HOSPITAL_COMMUNITY): Payer: BC Managed Care – PPO

## 2018-11-09 ENCOUNTER — Encounter (HOSPITAL_COMMUNITY)
Admission: RE | Admit: 2018-11-09 | Discharge: 2018-11-09 | Disposition: A | Discharge status: Home / Self Care | Payer: BC Managed Care – PPO | Source: Ambulatory Visit | Attending: Cardiology | Admitting: Cardiology

## 2018-11-09 ENCOUNTER — Other Ambulatory Visit: Payer: Self-pay

## 2018-11-09 DIAGNOSIS — I214 Non-ST elevation (NSTEMI) myocardial infarction: Secondary | ICD-10-CM

## 2018-11-09 DIAGNOSIS — Z955 Presence of coronary angioplasty implant and graft: Secondary | ICD-10-CM

## 2018-11-12 ENCOUNTER — Encounter (HOSPITAL_COMMUNITY)
Admission: RE | Admit: 2018-11-12 | Discharge: 2018-11-12 | Disposition: A | Discharge status: Home / Self Care | Payer: BC Managed Care – PPO | Source: Ambulatory Visit | Attending: Cardiology | Admitting: Cardiology

## 2018-11-12 ENCOUNTER — Other Ambulatory Visit: Payer: Self-pay

## 2018-11-12 DIAGNOSIS — Z955 Presence of coronary angioplasty implant and graft: Secondary | ICD-10-CM | POA: Insufficient documentation

## 2018-11-12 DIAGNOSIS — I214 Non-ST elevation (NSTEMI) myocardial infarction: Secondary | ICD-10-CM | POA: Insufficient documentation

## 2018-11-14 ENCOUNTER — Other Ambulatory Visit: Payer: Self-pay | Admitting: Internal Medicine

## 2018-11-14 ENCOUNTER — Encounter (HOSPITAL_COMMUNITY): Payer: BC Managed Care – PPO

## 2018-11-16 ENCOUNTER — Other Ambulatory Visit: Payer: Self-pay

## 2018-11-16 ENCOUNTER — Encounter (HOSPITAL_COMMUNITY)
Admission: RE | Admit: 2018-11-16 | Discharge: 2018-11-16 | Disposition: A | Discharge status: Home / Self Care | Payer: BC Managed Care – PPO | Source: Ambulatory Visit | Attending: Cardiology | Admitting: Cardiology

## 2018-11-16 DIAGNOSIS — I214 Non-ST elevation (NSTEMI) myocardial infarction: Secondary | ICD-10-CM | POA: Diagnosis not present

## 2018-11-16 DIAGNOSIS — Z955 Presence of coronary angioplasty implant and graft: Secondary | ICD-10-CM

## 2018-11-19 ENCOUNTER — Other Ambulatory Visit: Payer: Self-pay

## 2018-11-19 ENCOUNTER — Encounter (HOSPITAL_COMMUNITY)
Admission: RE | Admit: 2018-11-19 | Discharge: 2018-11-19 | Disposition: A | Discharge status: Home / Self Care | Payer: BC Managed Care – PPO | Source: Ambulatory Visit | Attending: Cardiology | Admitting: Cardiology

## 2018-11-19 DIAGNOSIS — I214 Non-ST elevation (NSTEMI) myocardial infarction: Secondary | ICD-10-CM | POA: Diagnosis not present

## 2018-11-19 DIAGNOSIS — Z955 Presence of coronary angioplasty implant and graft: Secondary | ICD-10-CM

## 2018-11-21 ENCOUNTER — Encounter (HOSPITAL_COMMUNITY): Payer: BC Managed Care – PPO

## 2018-11-22 ENCOUNTER — Telehealth: Payer: Self-pay

## 2018-11-22 NOTE — Telephone Encounter (Signed)
Medications reviewed, appt confirmed, pt does not have the capability to take vitals at home.

## 2018-11-22 NOTE — Progress Notes (Signed)
Virtual Visit via Video Note   This visit type was conducted due to national recommendations for restrictions regarding the COVID-19 Pandemic (e.g. social distancing) in an effort to limit this patient's exposure and mitigate transmission in our community.  Due to his co-morbid illnesses, this patient is at least at moderate risk for complications without adequate follow up.  This format is felt to be most appropriate for this patient at this time.  All issues noted in this document were discussed and addressed.  A limited physical exam was performed with this format.  Please refer to the patient's chart for his consent to telehealth for [facility].  Evaluation Performed:  Follow-up visit  This visit type was conducted due to national recommendations for restrictions regarding the COVID-19 Pandemic (e.g. social distancing).  This format is felt to be most appropriate for this patient at this time.  All issues noted in this document were discussed and addressed.  No physical exam was performed (except for noted visual exam findings with Video Visits).  Please refer to the patient's chart (MyChart message for video visits and phone note for telephone visits) for the patient's consent to telehealth for [facility].  Date:  11/23/2018   ID:  Andrew Carson, DOB Jul 20, 1955, MRN [id number]  Patient Location:  Home  Provider location:   [place]  PCP:  Plotnikov, Georgina QuintAleksei V, MD  Cardiologist:  Armanda Magicraci Arrin Ishler, MD Electrophysiologist:  None   Chief Complaint:  CAD, HTN, HLD, DM  History of Present Illness:    Andrew Carson is a 63 y.o. male who presents via audio/video conferencing for a telehealth visit today.    This is a 63yo male with a history of HTN and  Hyperlipidemia.  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  The pain was very severe and lasted 20 minutes.  He stopped and called an ambulance and was given NGT and ASA.  In ER  in [place] he ruled in for MI with elevated troponin.  He was transferred [facility] and underwent cath showing a 90% LAD s/p PCI with residual dz with  70% distal LCx and distal RCA lesions.  We ordered a copy of cath on CD from the [facility] but only received the report.    Since then he has done well with no further CP.  He is here today for followup and is doing well.  He denies any chest pain or pressure, SOB, DOE, PND, orthopnea, LE edema, dizziness, palpitations or syncope. He is compliant with his meds and is tolerating meds with no SE.  He is on DAPT with ASA and Brilinta and also on BB.  He is also on high dose statin.   The patient does not have symptoms concerning for COVID-19 infection (fever, chills, cough, or new shortness of breath).    Prior CV studies:   The following studies were reviewed today:  none  Past Medical History:  Diagnosis Date  . ABNORMAL GLUCOSE NEC 08/24/2007   Qualifier: Diagnosis of  By: Plotnikov MD, Georgina QuintAleksei V   . Anemia 08/11/2017   Colon  2017 Dr Leone PayorGessner Start iron 2019 GI ref if Hgb not better  . Coronary artery disease 08/07/2018   April 2020  Sharl MaMarty developed chest pain or shortness of breath in the [employer] truck while going through [place] on July 29, 2018, Sunday.  His partner was driving.  They stopped and called  the ambulance.  The chest pain was severe and lasted for about 20 minutes.  He was given nitroglycerin and aspirin.  They took him to [facility] in [place], [place] on April 20 where he was found t  . Diabetes mellitus 2011   type 2  . Dyslipidemia (high LDL; low HDL) 10/15/2010   Chronic Cardiac CT scoring info given 11/19  . Elevated PSA 09/06/2013   5/15 due to UTI Dr Annabell HowellsWrenn   . Essential hypertension 02/23/2007   Chronic Lotensin HCT, Coreg  Cardiac CT scoring test offered 11/19  . Finger pain, right 02/11/2011   10/12 R 4th prox phal - likely a partial flexor tendon rupture   . GERD  03/12/2007    On Omeprazole  . GERD (gastroesophageal reflux disease)   . Hypertension   . Hypogonadism male   . KNEE PAIN 03/12/2007   11/19 L   . Myocardial infarction [facility](HCC) 08/07/2018   July 29, 2018  Sharl MaMarty developed chest pain or shortness of breath in the [employer] truck while going through [place] on July 29, 2018, Sunday.  His partner was driving.  They stopped and called the ambulance.  The chest pain was severe and lasted for about 20 minutes.  He was given nitroglycerin and aspirin.  They took [facility] to [facility] in [place] on April 20 where he was fou  . Obesity   . Osteoarthritis   . OSTEOARTHRITIS 03/12/2007   Chronic     . Phimosis 01/29/2016   10/17 candida due to [place] - tolerable  . UPPER RESPIRATORY INFECTION, ACUTE 06/11/2010   Qualifier: Diagnosis of  By: Posey ReaPlotnikov MD, Georgina QuintAleksei V   . URTICARIA 06/05/2009   Qualifier: Diagnosis of  By: Posey ReaPlotnikov MD, Georgina QuintAleksei V   . UTI (urinary tract infection) 09/06/2013   5/15 dr Annabell HowellsWrenn   . Venous insufficiency 08/19/2011   B LE mild R>L   . Vitamin D deficiency 03/14/2007   Chronic      Past Surgical History:  Procedure Laterality Date  . LUMBAR LAMINECTOMY  07/1995   L5-S1  . WISDOM TOOTH EXTRACTION       Current Meds  Medication Sig  . aspirin EC 81 MG tablet Take 1 tablet (81 mg total) by mouth daily.  Marland Kitchen. atorvastatin (LIPITOR) 80 MG tablet Take 1 tablet (80 mg total) by mouth daily.  Ramond Dial. Bayer Microlet Lancets lancets Use to help check blood sugar twice a day  . benazepril-hydrochlorthiazide (LOTENSIN HCT) 20-12.5 MG tablet Take 1 tablet by mouth daily.  . carvedilol (COREG) 12.5 MG tablet Take 1 tablet (12.5 mg total) by mouth 2 (two) times daily with a meal.  . CVS D3 25 MCG (1000 UT) capsule TAKE 1 CAPSULE BY MOUTH EVERY DAY  . FARXIGA 10 MG TABS tablet TAKE 1 TABLET BY MOUTH EVERY DAY  . ferrous sulfate 325 (65 FE) MG tablet TAKE 1 TABLET BY MOUTH EVERY DAY  . glucose blood (CONTOUR NEXT TEST) test strip  Use to check blood sugars twice day  . ketoconazole (NIZORAL) 2 % cream Apply 1 application topically as needed for irritation.  . mometasone (ELOCON) 0.1 % cream APPLY TO AFFECTED AREA EVERY DAY  . nitroGLYCERIN (NITROSTAT) 0.4 MG SL tablet Place 0.4 mg under the tongue every 5 (five) minutes as needed for chest pain.  Marland Kitchen. omeprazole (PRILOSEC) 20 MG capsule TAKE 1 CAPSULE BY MOUTH EVERY DAY  . sitaGLIPtin-metformin (JANUMET) 50-1000 MG tablet Take 1 tablet by mouth 2 (two) times  daily with a meal.  . ticagrelor (BRILINTA) 90 MG TABS tablet Take 1 tablet (90 mg total) by mouth 2 (two) times daily.   Current Facility-Administered Medications for the 11/23/18 encounter (Telemedicine) with Sueanne Margarita, MD  Medication  . 0.9 %  sodium chloride infusion     Allergies:   Triamcinolone   Social History   Tobacco Use  . Smoking status: Never Smoker  . Smokeless tobacco: Never Used  Substance Use Topics  . Alcohol use: No  . Drug use: No     Family Hx: The patient's family history includes COPD in his mother; Heart disease (age of onset: 35) in his father; Heart disease (age of onset: 53) in his mother; Kidney disease in an other family member. There is no history of Colon cancer, Esophageal cancer, Rectal cancer, or Stomach cancer.  ROS:   Please see the history of present illness.     All other systems reviewed and are negative.   Labs/Other Tests and Data Reviewed:    Recent Labs: 09/10/2018: ALT 32   Recent Lipid Panel Lab Results  Component Value Date/Time   CHOL 93 09/10/2018 11:46 AM   TRIG 132 09/10/2018 11:46 AM   HDL 30 (L) 09/10/2018 11:46 AM   CHOLHDL 3.1 09/10/2018 11:46 AM   LDLCALC 41 09/10/2018 11:46 AM   LDLDIRECT 149.9 03/12/2007 08:34 AM    Wt Readings from Last 3 Encounters:  11/23/18 264 lb (119.7 kg)  10/18/18 274 lb 14.6 oz (124.7 kg)  10/18/18 275 lb (124.7 kg)     Objective:    Vital Signs:  BP 122/82   Pulse 75   Ht 5\' 10"  (1.778 m)   Wt  264 lb (119.7 kg)   BMI 37.88 kg/m    CONSTITUTIONAL:  Well nourished, well developed male in no acute distress.  EYES: anicteric MOUTH: oral mucosa is pink RESPIRATORY: Normal respiratory effort, symmetric expansion CARDIOVASCULAR: No peripheral edema SKIN: No rash, lesions or ulcers MUSCULOSKELETAL: no digital cyanosis NEURO: Cranial Nerves II-XII grossly intact, moves all extremities PSYCH: Intact judgement and insight.  A&O x 3, Mood/affect appropriate   ASSESSMENT & PLAN:    1.  ASCAD - s/p NSTEMI in [place] with cath showing 90% LAD and 70% distal LCx and distal RCA lesions.  S/P PCI of the LAD. He has not had any further anginal sx.  He will continue on DAPT with Brilinta 90mg  BID, ASA 81mg  daily, statin and BB.   2.  Hypertension - his BP is controlled on exam today.  He will continue on Carvedilol 12.5mg  BID, Benazepril-HCT 20-12.5mg  daily. I will get a copy of his last BMET from his PCP.  3.  Hyperlipidemia - his LDL goal is < 70.  His last LDL was 41 in June 2020.  He will continue on atorvastatin 80mg  daily.    4.  Type 2 DM - followed by PCP.    COVID-19 Education: The signs and symptoms of COVID-19 were discussed with the patient and how to seek care for testing (follow up with PCP or arrange E-visit).  The importance of social distancing was discussed today.  Patient Risk:   After full review of this patient's clinical status, I feel that they are at least moderate risk at this time.  Time:   Today, I have spent 20 minutes on telemedicine discussing medical problems including CAD, HTN, lipids.  We also reviewed the symptoms of COVID 19 and the ways to protect against contracting the virus  with telehealth technology.  I spent an additional 5 minutes reviewing patient's chart including labs.  Medication Adjustments/Labs and Tests Ordered: Current medicines are reviewed at length with the patient today.  Concerns regarding medicines are outlined above.  Tests Ordered:  No orders of the defined types were placed in this encounter.  Medication Changes: No orders of the defined types were placed in this encounter.   Disposition:  Follow up in 6 month(s) with extender and 1 year with me.   Signed, Armanda Magic, MD  11/23/2018 9:05 AM    [facility]

## 2018-11-23 ENCOUNTER — Encounter: Payer: Self-pay | Admitting: Cardiology

## 2018-11-23 ENCOUNTER — Other Ambulatory Visit: Payer: Self-pay

## 2018-11-23 ENCOUNTER — Telehealth (INDEPENDENT_AMBULATORY_CARE_PROVIDER_SITE_OTHER): Payer: BC Managed Care – PPO | Admitting: Cardiology

## 2018-11-23 ENCOUNTER — Encounter (HOSPITAL_COMMUNITY)
Admission: RE | Admit: 2018-11-23 | Discharge: 2018-11-23 | Disposition: A | Discharge status: Home / Self Care | Payer: BC Managed Care – PPO | Source: Ambulatory Visit | Attending: Cardiology | Admitting: Cardiology

## 2018-11-23 ENCOUNTER — Telehealth: Payer: Self-pay

## 2018-11-23 VITALS — BP 122/82 | HR 75 | Ht 70.0 in | Wt 264.0 lb

## 2018-11-23 DIAGNOSIS — I10 Essential (primary) hypertension: Secondary | ICD-10-CM

## 2018-11-23 DIAGNOSIS — Z955 Presence of coronary angioplasty implant and graft: Secondary | ICD-10-CM

## 2018-11-23 DIAGNOSIS — E785 Hyperlipidemia, unspecified: Secondary | ICD-10-CM | POA: Diagnosis not present

## 2018-11-23 DIAGNOSIS — I2510 Atherosclerotic heart disease of native coronary artery without angina pectoris: Secondary | ICD-10-CM

## 2018-11-23 DIAGNOSIS — I214 Non-ST elevation (NSTEMI) myocardial infarction: Secondary | ICD-10-CM | POA: Diagnosis not present

## 2018-11-23 DIAGNOSIS — R079 Chest pain, unspecified: Secondary | ICD-10-CM

## 2018-11-23 NOTE — Telephone Encounter (Addendum)
5:10pm Dr. Radford Pax came in and gave triage the pts DOT form.... she is ordering the pt to have a [employer].. LM for the pt that a nurse will call him back on Monday 11/26/18 to order and give him instructions and have scheduled for him.   DOT form placed in surgery clearance box.

## 2018-11-23 NOTE — Patient Instructions (Signed)
Medication Instructions:  Your physician recommends that you continue on your current medications as directed. Please refer to the Current Medication list given to you today.  If you need a refill on your cardiac medications before your next appointment, please call your pharmacy.   Lab work: We have requested a copy of your lab results (BMET) from your Primary Care Doctor  If you have labs (blood work) drawn today and your tests are completely normal, you will receive your results only by: Marland Kitchen MyChart Message (if you have MyChart) OR . A paper copy in the mail If you have any lab test that is abnormal or we need to change your treatment, we will call you to review the results.  Testing/Procedures: None ordered  Follow-Up: At [facility], you and your health needs are our priority.  As part of our continuing mission to provide you with exceptional heart care, we have created designated Provider Care Teams.  These Care Teams include your primary Cardiologist (physician) and Advanced Practice Providers (APPs -  Physician Assistants and Nurse Practitioners) who all work together to provide you with the care you need, when you need it. . You will need a follow up VIRTUAL appointment in 6 months.  Please call our office 2 months in advance to schedule this appointment.  You may see Fransico Him, MD or one of the following Advanced Practice Providers on your designated Care Team:   . Lyda Jester, PA-C . Dayna Dunn, PA-C . Ermalinda Barrios, PA-C  Any Other Special Instructions Will Be Listed Below (If Applicable).

## 2018-11-26 ENCOUNTER — Other Ambulatory Visit: Payer: Self-pay

## 2018-11-26 ENCOUNTER — Encounter (HOSPITAL_COMMUNITY)
Admission: RE | Admit: 2018-11-26 | Discharge: 2018-11-26 | Disposition: A | Discharge status: Home / Self Care | Payer: BC Managed Care – PPO | Source: Ambulatory Visit | Attending: Cardiology | Admitting: Cardiology

## 2018-11-26 ENCOUNTER — Other Ambulatory Visit: Payer: Self-pay | Admitting: Cardiology

## 2018-11-26 DIAGNOSIS — I214 Non-ST elevation (NSTEMI) myocardial infarction: Secondary | ICD-10-CM

## 2018-11-26 DIAGNOSIS — Z955 Presence of coronary angioplasty implant and graft: Secondary | ICD-10-CM

## 2018-11-26 NOTE — Telephone Encounter (Signed)
I spoke with the patient and reviewed instructions for [facility] and told him that someone would call to schedule.

## 2018-11-26 NOTE — Telephone Encounter (Signed)
lpmtcb 8/17 

## 2018-11-26 NOTE — Telephone Encounter (Signed)
Follow up    Patient is returning call. [phone] is returning call.

## 2018-11-27 ENCOUNTER — Other Ambulatory Visit: Payer: Self-pay | Admitting: Internal Medicine

## 2018-11-28 ENCOUNTER — Encounter (HOSPITAL_COMMUNITY): Payer: BC Managed Care – PPO

## 2018-11-29 ENCOUNTER — Telehealth (HOSPITAL_COMMUNITY): Payer: Self-pay | Admitting: *Deleted

## 2018-11-29 ENCOUNTER — Encounter (HOSPITAL_COMMUNITY): Payer: BC Managed Care – PPO

## 2018-11-29 NOTE — Progress Notes (Signed)
Cardiac Individual Treatment Plan  Patient Details  Name: Andrew Carson MRN: [id number] Date of Birth: 1956/01/06 Referring Provider:     CARDIAC REHAB PHASE II ORIENTATION from 10/18/2018 in [facility] CARDIAC [facility]  Referring Provider  Mayford Knife, [place] R       Initial Encounter Date:    CARDIAC REHAB PHASE II ORIENTATION from 10/18/2018 in [facility] CARDIAC REHAB  Date  10/18/18      Visit Diagnosis: NSTEMI (non-ST elevated myocardial infarction) [facility])  Status post coronary artery stent placement  Patient's Home Medications on Admission:  Current Outpatient Medications:  .  aspirin EC 81 MG tablet, Take 1 tablet (81 mg total) by mouth daily., Disp: 100 tablet, Rfl: 3 .  atorvastatin (LIPITOR) 80 MG tablet, Take 1 tablet (80 mg total) by mouth daily., Disp: 90 tablet, Rfl: 3 .  Bayer Microlet Lancets lancets, Use to help check blood sugar twice a day, Disp: 100 each, Rfl: 12 .  benazepril-hydrochlorthiazide (LOTENSIN HCT) 20-12.5 MG tablet, Take 1 tablet by mouth daily., Disp: 90 tablet, Rfl: 3 .  BRILINTA 90 MG TABS tablet, TAKE 1 TABLET BY MOUTH TWICE A DAY, Disp: 180 tablet, Rfl: 3 .  carvedilol (COREG) 12.5 MG tablet, Take 1 tablet (12.5 mg total) by mouth 2 (two) times daily with a meal., Disp: 180 tablet, Rfl: 3 .  [employer] D3 25 MCG (1000 UT) capsule, TAKE 1 CAPSULE BY MOUTH EVERY DAY, Disp: 90 capsule, Rfl: 3 .  FARXIGA 10 MG TABS tablet, TAKE 1 TABLET BY MOUTH EVERY DAY, Disp: 90 tablet, Rfl: 3 .  ferrous sulfate 325 (65 FE) MG tablet, TAKE 1 TABLET BY MOUTH EVERY DAY, Disp: 90 tablet, Rfl: 1 .  glucose blood (CONTOUR NEXT TEST) test strip, Use to check blood sugars twice day, Disp: 100 each, Rfl: 12 .  JANUMET 50-1000 MG tablet, TAKE 1 TABLET BY MOUTH 2 (TWO) TIMES DAILY WITH A MEAL., Disp: 180 tablet, Rfl: 1 .  ketoconazole (NIZORAL) 2 % cream, Apply 1 application topically as needed for irritation., Disp: , Rfl:  .  mometasone (ELOCON) 0.1 %  cream, APPLY TO AFFECTED AREA EVERY DAY, Disp: 45 g, Rfl: 3 .  nitroGLYCERIN (NITROSTAT) 0.4 MG SL tablet, Place 0.4 mg under the tongue every 5 (five) minutes as needed for chest pain., Disp: , Rfl:  .  omeprazole (PRILOSEC) 20 MG capsule, TAKE 1 CAPSULE BY MOUTH EVERY DAY, Disp: 90 capsule, Rfl: 3  Current Facility-Administered Medications:  .  0.9 %  sodium chloride infusion, 500 mL, Intravenous, Continuous, Iva Boop, MD  Past Medical History: Past Medical History:  Diagnosis Date  . ABNORMAL GLUCOSE NEC 08/24/2007   Qualifier: Diagnosis of  By: Plotnikov MD, Georgina Quint   . Anemia 08/11/2017   Colon  2017 Dr Leone Payor Start iron 2019 GI ref if Hgb not better  . Coronary artery disease 08/07/2018   April 2020  [name] developed chest pain or shortness of breath in the [employer] truck while going through [place] on July 29, 2018, Sunday.  His partner was driving.  They stopped and called the ambulance.  The chest pain was severe and lasted for about 20 minutes.  He was given nitroglycerin and aspirin.  They took him to [facility] in [place], [place] on April 20 where he was found t  . Diabetes mellitus 2011   type 2  . Dyslipidemia (high LDL; low HDL) 10/15/2010   Chronic Cardiac CT scoring info given 11/19  .  Elevated PSA 09/06/2013   5/15 due to UTI Dr Annabell Howells   . Essential hypertension 02/23/2007   Chronic Lotensin HCT, Coreg  Cardiac CT scoring test offered 11/19  . Finger pain, right 02/11/2011   10/12 R 4th prox phal - likely a partial flexor tendon rupture   . GERD 03/12/2007    On Omeprazole  . GERD (gastroesophageal reflux disease)   . Hypertension   . Hypogonadism male   . KNEE PAIN 03/12/2007   11/19 L   . Myocardial infarction [facility]) 08/07/2018   July 29, 2018  [name] developed chest pain or shortness of breath in the [employer] truck while going through [place] on July 29, 2018, Sunday.  His partner was driving.  They stopped and called the ambulance.  The chest pain was  severe and lasted for about 20 minutes.  He was given nitroglycerin and aspirin.  They took him to [facility] in [place], [place] on April 20 where he was fou  . Obesity   . Osteoarthritis   . OSTEOARTHRITIS 03/12/2007   Chronic     . Phimosis 01/29/2016   10/17 candida due to [place] - tolerable  . UPPER RESPIRATORY INFECTION, ACUTE 06/11/2010   Qualifier: Diagnosis of  By: Posey Rea MD, Georgina Quint URTICARIA 06/05/2009   Qualifier: Diagnosis of  By: Posey Rea MD, Georgina Quint   . UTI (urinary tract infection) 09/06/2013   5/15 dr Annabell Howells   . Venous insufficiency 08/19/2011   B LE mild R>L   . Vitamin D deficiency 03/14/2007   Chronic       Tobacco Use: Social History   Tobacco Use  Smoking Status Never Smoker  Smokeless Tobacco Never Used    Labs: Recent Review Flowsheet Data    Labs for ITP Cardiac and Pulmonary Rehab Latest Ref Rng & Units 05/30/2016 08/20/2016 12/20/2016 11/11/2017 09/10/2018   Cholestrol <200 mg/dL - - 027 - 93   LDLCALC mg/dL (calc) - - 741 - 41   LDLDIRECT mg/dL - - - - -   HDL > OR = 40 mg/dL - - 28(N) - 86(V)   Trlycerides <150 mg/dL - - 672(C) - 947   Hemoglobin A1c - 6.7 6.8 7.2 6.7 -      Capillary Blood Glucose: Lab Results  Component Value Date   GLUCAP 104 (H) 10/26/2018   GLUCAP 122 (H) 10/26/2018   GLUCAP 104 (H) 10/22/2018   GLUCAP 177 (H) 10/22/2018   GLUCAP 104 (H) 10/18/2018     Exercise Target Goals: Exercise Program Goal: Individual exercise prescription set using results from initial 6 min walk test and THRR while considering  patient's activity barriers and safety.   Exercise Prescription Goal: Initial exercise prescription builds to 30-45 minutes a day of aerobic activity, 2-3 days per week.  Home exercise guidelines will be given to patient during program as part of exercise prescription that the participant will acknowledge.  Activity Barriers & Risk Stratification: Activity Barriers & Cardiac Risk Stratification  - 10/18/18 1548      Activity Barriers & Cardiac Risk Stratification   Activity Barriers  None    Cardiac Risk Stratification  High       6 Minute Walk: 6 Minute Walk    Row Name 10/18/18 1543 11/26/18 1046       6 Minute Walk   Phase  Initial  Discharge    Distance  1677 feet  1901 feet    Distance % Change  -  13.36 %  Walk Time  6 minutes  6 minutes    # of Rest Breaks  0  0    MPH  3.18  3.6    METS  3.45  4.18    RPE  11  11    Perceived Dyspnea   0  0    VO2 Peak  12.06  14.63    Symptoms  No  No    Resting HR  70 bpm  80 bpm    Resting BP  98/68  108/78    Resting Oxygen Saturation   97 %  -    Exercise Oxygen Saturation  during 6 min walk  98 %  -    Max Ex. HR  105 bpm  113 bpm    Max Ex. BP  145/88  166/62    2 Minute Post BP  123/83  108/64       Oxygen Initial Assessment:   Oxygen Re-Evaluation:   Oxygen Discharge (Final Oxygen Re-Evaluation):   Initial Exercise Prescription: Initial Exercise Prescription - 10/18/18 1500      Date of Initial Exercise RX and Referring Provider   Date  10/18/18    Referring Provider  Armanda Magic R     Expected Discharge Date  11/30/18      Treadmill   MPH  2.6    Grade  1    Minutes  15    METs  3.35      NuStep   Level  2    SPM  85    Minutes  15    METs  3      Prescription Details   Frequency (times per week)  3x    Duration  Progress to 30 minutes of continuous aerobic without signs/symptoms of physical distress      Intensity   THRR 40-80% of Max Heartrate  63-126    Ratings of Perceived Exertion  11-13    Perceived Dyspnea  0-4      Progression   Progression  Continue progressive overload as per policy without signs/symptoms or physical distress.      Resistance Training   Training Prescription  Yes    Weight  5lbs    Reps  10-15       Perform Capillary Blood Glucose checks as needed.  Exercise Prescription Changes: Exercise Prescription Changes    Row Name 10/22/18 1200  11/05/18 1054 11/26/18 1055         Response to Exercise   Blood Pressure (Admit)  124/70  120/60  108/78     Blood Pressure (Exercise)  150/72  136/64  166/62     Blood Pressure (Exit)  110/68  102/64  108/64     Heart Rate (Admit)  78 bpm  85 bpm  80 bpm     Heart Rate (Exercise)  103 bpm  105 bpm  134 bpm     Heart Rate (Exit)  72 bpm  70 bpm  86 bpm     Rating of Perceived Exertion (Exercise)  Symptoms  none  none  none     Comments  Pt first day of exercise  -  -     Duration  Continue with 30 min of aerobic exercise without signs/symptoms of physical distress.  Continue with 30 min of aerobic exercise without signs/symptoms of physical distress.  Continue with 30 min of aerobic exercise without signs/symptoms of physical distress.  Intensity  THRR unchanged  THRR unchanged  THRR unchanged       Progression   Progression  Continue to progress workloads to maintain intensity without signs/symptoms of physical distress.  Continue to progress workloads to maintain intensity without signs/symptoms of physical distress.  Continue to progress workloads to maintain intensity without signs/symptoms of physical distress.     Average METs  2.9  3.4  4.5       Resistance Training   Training Prescription  Yes  Yes  Yes     Weight  5lbs  5lbs  5lbs     Reps  10-15  10-15  10-15     Time  10 Minutes  10 Minutes  10 Minutes       Interval Training   Interval Training  -  No  No       Treadmill   MPH  2.8  3  3.5     Grade  1  1  1      Minutes  15  15  15      METs  3.35  3.71  4.16       NuStep   Level  3  3  5      SPM  85  85  85     Minutes  15  15  15      METs  2.6  3  5.5       Track   Laps  -  -  9.5 1901 ft     Minutes  -  -  6 Walk test     METs  -  -  3.76       Home Exercise Plan   Plans to continue exercise at  -  Home (comment) Walking  Home (comment) Walking     Frequency  -  Add 1 additional day to program exercise sessions.  Add 1 additional day  to program exercise sessions.     Initial Home Exercises Provided  -  11/05/18  11/05/18        Exercise Comments: Exercise Comments    Row Name 10/22/18 1208 11/05/18 1106 11/26/18 1055       Exercise Comments  Pt first day of exercise. Tolerated exercise well.  Reviewed home exercise guidelines with patient.  Reviewed goals with patient.        Exercise Goals and Review: Exercise Goals    Row Name 10/18/18 1552             Exercise Goals   Increase Physical Activity  Yes       Intervention  Provide advice, education, support and counseling about physical activity/exercise needs.;Develop an individualized exercise prescription for aerobic and resistive training based on initial evaluation findings, risk stratification, comorbidities and participant's personal goals.       Expected Outcomes  Short Term: Attend rehab on a regular basis to increase amount of physical activity.;Long Term: Add in home exercise to make exercise part of routine and to increase amount of physical activity.;Long Term: Exercising regularly at least 3-5 days a week.       Increase Strength and Stamina  Yes       Intervention  Provide advice, education, support and counseling about physical activity/exercise needs.;Develop an individualized exercise prescription for aerobic and resistive training based on initial evaluation findings, risk stratification, comorbidities and participant's personal goals.       Expected Outcomes  Short Term: Increase workloads from initial exercise prescription for resistance,  speed, and METs.;Short Term: Perform resistance training exercises routinely during rehab and add in resistance training at home;Long Term: Improve cardiorespiratory fitness, muscular endurance and strength as measured by increased METs and functional capacity (6MWT)       Able to understand and use rate of perceived exertion (RPE) scale  Yes       Intervention  Provide education and explanation on how to use  RPE scale       Expected Outcomes  Short Term: Able to use RPE daily in rehab to express subjective intensity level;Long Term:  Able to use RPE to guide intensity level when exercising independently       Knowledge and understanding of Target Heart Rate Range (THRR)  Yes       Intervention  Provide education and explanation of THRR including how the numbers were predicted and where they are located for reference       Expected Outcomes  Long Term: Able to use THRR to govern intensity when exercising independently;Short Term: Able to use daily as guideline for intensity in rehab;Short Term: Able to state/look up THRR       Able to check pulse independently  Yes       Intervention  Provide education and demonstration on how to check pulse in carotid and radial arteries.;Review the importance of being able to check your own pulse for safety during independent exercise       Expected Outcomes  Short Term: Able to explain why pulse checking is important during independent exercise;Long Term: Able to check pulse independently and accurately       Understanding of Exercise Prescription  Yes       Intervention  Provide education, explanation, and written materials on patient's individual exercise prescription       Expected Outcomes  Short Term: Able to explain program exercise prescription;Long Term: Able to explain home exercise prescription to exercise independently          Exercise Goals Re-Evaluation : Exercise Goals Re-Evaluation    Row Name 10/22/18 1206 11/05/18 1106 11/26/18 1055         Exercise Goal Re-Evaluation   Exercise Goals Review  Increase Physical Activity;Increase Strength and Stamina;Able to understand and use rate of perceived exertion (RPE) scale;Knowledge and understanding of Target Heart Rate Range (THRR);Able to check pulse independently;Understanding of Exercise Prescription  Increase Physical Activity;Increase Strength and Stamina;Able to understand and use rate of perceived  exertion (RPE) scale;Knowledge and understanding of Target Heart Rate Range (THRR);Able to check pulse independently;Understanding of Exercise Prescription  Increase Physical Activity;Increase Strength and Stamina;Able to understand and use rate of perceived exertion (RPE) scale;Knowledge and understanding of Target Heart Rate Range (THRR);Able to check pulse independently;Understanding of Exercise Prescription     Comments  Pt first day of exercise. Pt tolerated exercise prescription. Pt increased speed on treadmill and tolerated it well.  Reviewed home exercise guidelines with patient. 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 Pt will try to add at least 1 day of exercise walking in addition to exercise at cardiac rehab.  Patient will be completing the cardiac rehab program this week and has progressed well with exercise. Pt has lost 120.6kg since orientation, which was one of his goals, and has established a regular exericse routine. Pt is walking outdoors and has a treadmill at home, which he plans to use as his mode of exercise upon completion of the CR program. Pt  aslo has access to 3lbs hand weights and plans to get a heavier set to use for his resistance. Pt's functional capacity improved 13% as measured by 6MT, strength imporved 10%, and flexibility improved 3%.     Expected Outcomes  Will continue to monitor and progress Pt as tolerated.  Patient walk 30 minutes, at least 1 day in addition to exericse at cardiac rehab.  Patient will continue exercise routine at home walking at least 30 minutes 3-4 days/week to maintain health and fitness gains.        Discharge Exercise Prescription (Final Exercise Prescription Changes): Exercise Prescription Changes - 11/26/18 1055      Response to Exercise   Blood Pressure (Admit)  108/78    Blood Pressure (Exercise)  166/62    Blood Pressure (Exit)  108/64    Heart Rate (Admit)  80 bpm    Heart Rate  (Exercise)  134 bpm    Heart Rate (Exit)  86 bpm    Rating of Perceived Exertion (Exercise)  13    Symptoms  none    Duration  Continue with 30 min of aerobic exercise without signs/symptoms of physical distress.    Intensity  THRR unchanged      Progression   Progression  Continue to progress workloads to maintain intensity without signs/symptoms of physical distress.    Average METs  4.5      Resistance Training   Training Prescription  Yes    Weight  5lbs    Reps  10-15    Time  10 Minutes      Interval Training   Interval Training  No      Treadmill   MPH  3.5    Grade  1    Minutes  15    METs  4.16      NuStep   Level  5    SPM  85    Minutes  15    METs  5.5      Track   Laps  9.5   1901 ft   Minutes  6   Walk test   METs  3.76      Home Exercise Plan   Plans to continue exercise at  Home (comment)   Walking   Frequency  Add 1 additional day to program exercise sessions.    Initial Home Exercises Provided  11/05/18       Nutrition:  Target Goals: Understanding of nutrition guidelines, daily intake of sodium 1500mg , cholesterol 200mg , calories 30% from fat and 7% or less from saturated fats, daily to have 5 or more servings of fruits and vegetables.  Biometrics: Pre Biometrics - 10/18/18 1548      Pre Biometrics   Height  5\' 10"  (1.778 m)    Weight  124.7 kg    Waist Circumference  47.5 inches    Hip Circumference  52 inches    Waist to Hip Ratio  0.91 %    BMI (Calculated)  39.45    Triceps Skinfold  35 mm    % Body Fat  36 %    Grip Strength  46 kg    Flexibility  19 in    Single Leg Stand  5.18 seconds      Post Biometrics - 11/26/18 1052       Post  Biometrics   Waist Circumference  45.5 inches    Hip Circumference  49.25 inches    Waist to Hip Ratio  0.92 %  Triceps Skinfold  26 mm    Grip Strength  50.5 kg    Flexibility  19.5 in    Single Leg Stand  30 seconds       Nutrition Therapy Plan and Nutrition  Goals:   Nutrition Assessments:   Nutrition Goals Re-Evaluation:   Nutrition Goals Re-Evaluation:   Nutrition Goals Discharge (Final Nutrition Goals Re-Evaluation):   Psychosocial: Target Goals: Acknowledge presence or absence of significant depression and/or stress, maximize coping skills, provide positive support system. Participant is able to verbalize types and ability to use techniques and skills needed for reducing stress and depression.  Initial Review & Psychosocial Screening: Initial Psych Review & Screening - 10/18/18 1607      Initial Review   Current issues with  None Identified      Family Dynamics   Good Support System?  Yes      Barriers   Psychosocial barriers to participate in program  There are no identifiable barriers or psychosocial needs.       Quality of Life Scores: Quality of Life - 11/26/18 1641      Quality of Life   Select  Quality of Life      Quality of Life Scores   Health/Function Pre  21.63 %    Health/Function Post  22.7 %    Health/Function % Change  4.95 %    Socioeconomic Pre  24.83 %    Socioeconomic Post  25.93 %    Socioeconomic % Change   4.43 %    Psych/Spiritual Pre  23 %    Psych/Spiritual Post  23.71 %    Psych/Spiritual % Change  3.09 %    Family Pre  22.8 %    Family Post  30 %    Family % Change  31.58 %    GLOBAL Pre  22.68 %    GLOBAL Post  24.65 %    GLOBAL % Change  8.69 %      Scores of 19 and below usually indicate a poorer quality of life in these areas.  A difference of  2-3 points is a clinically meaningful difference.  A difference of 2-3 points in the total score of the Quality of Life Index has been associated with significant improvement in overall quality of life, self-image, physical symptoms, and general health in studies assessing change in quality of life.  PHQ-9: Recent Review Flowsheet Data    Depression screen [facility] 2/9 10/22/2018 11/17/2017 09/02/2016 09/25/2015   Decreased Interest 0 0 0 0    Down, Depressed, Hopeless 0 0 0 0   PHQ - 2 Score 0 0 0 0     Interpretation of Total Score  Total Score Depression Severity:  1-4 = Minimal depression, 5-9 = Mild depression, 10-14 = Moderate depression, 15-19 = Moderately severe depression, 20-27 = Severe depression   Psychosocial Evaluation and Intervention: Psychosocial Evaluation - 10/22/18 1153      Psychosocial Evaluation & Interventions   Interventions  Encouraged to exercise with the program and follow exercise prescription    Comments  No psychosocial needs identified.    Expected Outcomes  [name] will maintain a positive outlook utilizing good coping skills.    Continue Psychosocial Services   No Follow up required       Psychosocial Re-Evaluation: Psychosocial Re-Evaluation    Row Name 10/29/18 0917 11/28/18 1001           Psychosocial Re-Evaluation   Current issues with  None Identified  None Identified      Comments  No psychosocial needs identified.  No psychosocial needs identified.      Expected Outcomes  Sharl MaMarty will maintain a positive outlook with good coping skills.  Sharl MaMarty will maintain a positive outlook with good coping skills.      Interventions  Encouraged to attend Cardiac Rehabilitation for the exercise  Encouraged to attend Cardiac Rehabilitation for the exercise      Continue Psychosocial Services   No Follow up required  No Follow up required         Psychosocial Discharge (Final Psychosocial Re-Evaluation): Psychosocial Re-Evaluation - 11/28/18 1001      Psychosocial Re-Evaluation   Current issues with  None Identified    Comments  No psychosocial needs identified.    Expected Outcomes  Sharl MaMarty will maintain a positive outlook with good coping skills.    Interventions  Encouraged to attend Cardiac Rehabilitation for the exercise    Continue Psychosocial Services   No Follow up required       Vocational Rehabilitation: Provide vocational rehab assistance to qualifying candidates.   Vocational  Rehab Evaluation & Intervention: Vocational Rehab - 10/18/18 1607      Initial Vocational Rehab Evaluation & Intervention   Assessment shows need for Vocational Rehabilitation  No       Education: Education Goals: Education classes will be provided on a weekly basis, covering required topics. Participant will state understanding/return demonstration of topics presented.  Learning Barriers/Preferences: Learning Barriers/Preferences - 10/18/18 1556      Learning Barriers/Preferences   Learning Barriers  None    Learning Preferences  Video;Skilled Demonstration;Verbal Instruction       Education Topics: Count Your Pulse:  -Group instruction provided by verbal instruction, demonstration, patient participation and written materials to support subject.  Instructors address importance of being able to find your pulse and how to count your pulse when at home without a heart monitor.  Patients get hands on experience counting their pulse with staff help and individually.   Heart Attack, Angina, and Risk Factor Modification:  -Group instruction provided by verbal instruction, video, and written materials to support subject.  Instructors address signs and symptoms of angina and heart attacks.    Also discuss risk factors for heart disease and how to make changes to improve heart health risk factors.   Functional Fitness:  -Group instruction provided by verbal instruction, demonstration, patient participation, and written materials to support subject.  Instructors address safety measures for doing things around the house.  Discuss how to get up and down off the floor, how to pick things up properly, how to safely get out of a chair without assistance, and balance training.   Meditation and Mindfulness:  -Group instruction provided by verbal instruction, patient participation, and written materials to support subject.  Instructor addresses importance of mindfulness and meditation practice to  help reduce stress and improve awareness.  Instructor also leads participants through a meditation exercise.    Stretching for Flexibility and Mobility:  -Group instruction provided by verbal instruction, patient participation, and written materials to support subject.  Instructors lead participants through series of stretches that are designed to increase flexibility thus improving mobility.  These stretches are additional exercise for major muscle groups that are typically performed during regular warm up and cool down.   Hands Only CPR:  -Group verbal, video, and participation provides a basic overview of AHA guidelines for community CPR. Role-play of emergencies allow participants the opportunity to practice calling  for help and chest compression technique with discussion of AED use.   Hypertension: -Group verbal and written instruction that provides a basic overview of hypertension including the most recent diagnostic guidelines, risk factor reduction with self-care instructions and medication management.    Nutrition I class: Heart Healthy Eating:  -Group instruction provided by PowerPoint slides, verbal discussion, and written materials to support subject matter. The instructor gives an explanation and review of the Therapeutic Lifestyle Changes diet recommendations, which includes a discussion on lipid goals, dietary fat, sodium, fiber, plant stanol/sterol esters, sugar, and the components of a well-balanced, healthy diet.   Nutrition II class: Lifestyle Skills:  -Group instruction provided by PowerPoint slides, verbal discussion, and written materials to support subject matter. The instructor gives an explanation and review of label reading, grocery shopping for heart health, heart healthy recipe modifications, and ways to make healthier choices when eating out.   Diabetes Question & Answer:  -Group instruction provided by PowerPoint slides, verbal discussion, and written materials  to support subject matter. The instructor gives an explanation and review of diabetes co-morbidities, pre- and post-prandial blood glucose goals, pre-exercise blood glucose goals, signs, symptoms, and treatment of hypoglycemia and hyperglycemia, and foot care basics.   Diabetes Blitz:  -Group instruction provided by PowerPoint slides, verbal discussion, and written materials to support subject matter. The instructor gives an explanation and review of the physiology behind type 1 and type 2 diabetes, diabetes medications and rational behind using different medications, pre- and post-prandial blood glucose recommendations and Hemoglobin A1c goals, diabetes diet, and exercise including blood glucose guidelines for exercising safely.    Portion Distortion:  -Group instruction provided by PowerPoint slides, verbal discussion, written materials, and food models to support subject matter. The instructor gives an explanation of serving size versus portion size, changes in portions sizes over the last 20 years, and what consists of a serving from each food group.   Stress Management:  -Group instruction provided by verbal instruction, video, and written materials to support subject matter.  Instructors review role of stress in heart disease and how to cope with stress positively.     Exercising on Your Own:  -Group instruction provided by verbal instruction, power point, and written materials to support subject.  Instructors discuss benefits of exercise, components of exercise, frequency and intensity of exercise, and end points for exercise.  Also discuss use of nitroglycerin and activating EMS.  Review options of places to exercise outside of rehab.  Review guidelines for sex with heart disease.   Cardiac Drugs I:  -Group instruction provided by verbal instruction and written materials to support subject.  Instructor reviews cardiac drug classes: antiplatelets, anticoagulants, beta blockers, and  statins.  Instructor discusses reasons, side effects, and lifestyle considerations for each drug class.   Cardiac Drugs II:  -Group instruction provided by verbal instruction and written materials to support subject.  Instructor reviews cardiac drug classes: angiotensin converting enzyme inhibitors (ACE-I), angiotensin II receptor blockers (ARBs), nitrates, and calcium channel blockers.  Instructor discusses reasons, side effects, and lifestyle considerations for each drug class.   Anatomy and Physiology of the Circulatory System:  Group verbal and written instruction and models provide basic cardiac anatomy and physiology, with the coronary electrical and arterial systems. Review of: AMI, Angina, Valve disease, Heart Failure, Peripheral Artery Disease, Cardiac Arrhythmia, Pacemakers, and the ICD.   Other Education:  -Group or individual verbal, written, or video instructions that support the educational goals of the cardiac rehab program.  Holiday Eating Survival Tips:  -Group instruction provided by PowerPoint slides, verbal discussion, and written materials to support subject matter. The instructor gives patients tips, tricks, and techniques to help them not only survive but enjoy the holidays despite the onslaught of food that accompanies the holidays.   Knowledge Questionnaire Score: Knowledge Questionnaire Score - 11/26/18 1640      Knowledge Questionnaire Score   Pre Score  20/24    Post Score  24/24       Core Components/Risk Factors/Patient Goals at Admission: Personal Goals and Risk Factors at Admission - 10/18/18 1555      Core Components/Risk Factors/Patient Goals on Admission   Hypertension  Yes    Intervention  Provide education on lifestyle modifcations including regular physical activity/exercise, weight management, moderate sodium restriction and increased consumption of fresh fruit, vegetables, and low fat dairy, alcohol moderation, and smoking cessation.;Monitor  prescription use compliance.    Expected Outcomes  Short Term: Continued assessment and intervention until BP is < 140/71mm HG in hypertensive participants. < 130/76mm HG in hypertensive participants with diabetes, heart failure or chronic kidney disease.;Long Term: Maintenance of blood pressure at goal levels.    Lipids  Yes    Intervention  Provide education and support for participant on nutrition & aerobic/resistive exercise along with prescribed medications to achieve LDL 70mg , HDL >40mg .    Expected Outcomes  Short Term: Participant states understanding of desired cholesterol values and is compliant with medications prescribed. Participant is following exercise prescription and nutrition guidelines.;Long Term: Cholesterol controlled with medications as prescribed, with individualized exercise RX and with personalized nutrition plan. Value goals: LDL < , HDL > 40 mg.       Core Components/Risk Factors/Patient Goals Review:  Goals and Risk Factor Review    Row Name 10/22/18 1154 10/29/18 0917 11/28/18 1000         Core Components/Risk Factors/Patient Goals Review   Personal Goals Review  Hypertension;Lipids  Hypertension;Lipids  Hypertension;Lipids     Review  Pt with few CAD RFs willing to participate in CR exercise.  [name] would like to lose weight and establish an exercise routine.  Pt with few CAD RFs willing to participate in CR exercise.  [name] is tolerating exercise well during his first week of CR.  30 Day ITP Review. [name] continues to tolerate exercise well.  He will graduate 11/30/2018 and feels that he is increasing his strength.     Expected Outcomes  Pt will continue to participate in CR exercise, nutrition, and lifestyle modification opportunities.  Pt will continue to participate in CR exercise, nutrition, and lifestyle modification opportunities.  Pt will continue to participate in CR exercise, nutrition, and lifestyle modification opportunities.        Core  Components/Risk Factors/Patient Goals at Discharge (Final Review):  Goals and Risk Factor Review - 11/28/18 1000      Core Components/Risk Factors/Patient Goals Review   Personal Goals Review  Hypertension;Lipids    Review  30 Day ITP Review. [name] continues to tolerate exercise well.  He will graduate 11/30/2018 and feels that he is increasing his strength.    Expected Outcomes  Pt will continue to participate in CR exercise, nutrition, and lifestyle modification opportunities.       ITP Comments: ITP Comments    Row Name 10/18/18 1433 10/22/18 1153 10/29/18 0916 11/28/18 0958     ITP Comments  Dr. Armanda Magic, Medical Director  Pt started exercise today and tolerated it well.  30 Day ITP Review.  Sharl MaMarty is tolerating exercise well. VSS.  30 Day ITP Review. Sharl MaMarty continues to tolerate exercise well.  He will graduate 11/30/2018 and feels that he is increasing his strength.       Comments: See ITP Comments.

## 2018-11-29 NOTE — Telephone Encounter (Signed)
Patient given detailed instructions per Myocardial Perfusion Study Information Sheet for the test on 12/03/18 at 7:30. Patient notified to arrive 15 minutes early and that it is imperative to arrive on time for appointment to keep from having the test rescheduled.  If you need to cancel or reschedule your appointment, please call the office within 24 hours of your appointment. . Patient verbalized understanding.[name]

## 2018-11-30 ENCOUNTER — Other Ambulatory Visit: Payer: Self-pay

## 2018-11-30 ENCOUNTER — Encounter (HOSPITAL_COMMUNITY)
Admission: RE | Admit: 2018-11-30 | Discharge: 2018-11-30 | Disposition: A | Discharge status: Home / Self Care | Payer: BC Managed Care – PPO | Source: Ambulatory Visit | Attending: Cardiology | Admitting: Cardiology

## 2018-11-30 VITALS — BP 118/64 | HR 95 | Ht 70.0 in | Wt 268.7 lb

## 2018-11-30 DIAGNOSIS — Z955 Presence of coronary angioplasty implant and graft: Secondary | ICD-10-CM

## 2018-11-30 DIAGNOSIS — I214 Non-ST elevation (NSTEMI) myocardial infarction: Secondary | ICD-10-CM

## 2018-12-03 ENCOUNTER — Telehealth: Payer: Self-pay

## 2018-12-03 ENCOUNTER — Other Ambulatory Visit: Payer: Self-pay

## 2018-12-03 ENCOUNTER — Ambulatory Visit (HOSPITAL_COMMUNITY): Payer: BC Managed Care – PPO | Attending: Cardiovascular Disease

## 2018-12-03 DIAGNOSIS — R079 Chest pain, unspecified: Secondary | ICD-10-CM | POA: Diagnosis present

## 2018-12-03 LAB — MYOCARDIAL PERFUSION IMAGING
LV dias vol: 119 mL (ref 62–150)
LV sys vol: 47 mL
Peak HR: 82 {beats}/min
Rest HR: 56 {beats}/min
SDS: 0
SRS: 0
SSS: 0
TID: 1.11

## 2018-12-03 MED ORDER — TECHNETIUM TC 99M TETROFOSMIN IV KIT
10.1000 | PACK | Freq: Once | INTRAVENOUS | Status: AC | PRN
  Administered 2018-12-03: 10.1 via INTRAVENOUS
  Filled 2018-12-03: qty 11

## 2018-12-03 MED ORDER — TECHNETIUM TC 99M TETROFOSMIN IV KIT
31.6000 | PACK | Freq: Once | INTRAVENOUS | Status: AC | PRN
  Administered 2018-12-03: 31.6 via INTRAVENOUS
  Filled 2018-12-03: qty 32

## 2018-12-03 MED ORDER — REGADENOSON 0.4 MG/5ML IV SOLN
0.4000 mg | Freq: Once | INTRAVENOUS | Status: AC
  Administered 2018-12-03: 0.4 mg via INTRAVENOUS

## 2018-12-03 NOTE — Telephone Encounter (Signed)
Notes recorded by Frederik Schmidt, RN on 12/03/2018 at 1:35 PM EDT  The patient has been notified of the result and verbalized understanding. All questions (if any) were answered.  Frederik Schmidt, RN 12/03/2018 1:35 PM

## 2018-12-03 NOTE — Telephone Encounter (Signed)
-----   Message from Sueanne Margarita, MD sent at 12/03/2018  1:07 PM EDT ----- Please let patient know that stress test was fine

## 2018-12-04 ENCOUNTER — Telehealth: Payer: Self-pay

## 2018-12-04 NOTE — Telephone Encounter (Signed)
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 Patient verbalized understanding and thanked me for the call.

## 2018-12-06 NOTE — Progress Notes (Signed)
Discharge Progress Report  Patient Details  Name: Andrew Carson MRN: [id number] Date of Birth: Oct 09, 1955 Referring Provider:     [place] from 10/18/2018 in [place]  Referring Provider  Fransico Him R        Number of Visits: 12  Reason for Discharge:  Patient reached a stable level of exercise. Patient independent in their exercise. Patient has met program and personal goals.  Smoking History:  Social History   Tobacco Use  Smoking Status Never Smoker  Smokeless Tobacco Never Used    Diagnosis:  NSTEMI (non-ST elevated myocardial infarction) ([place])  Status post coronary artery stent placement  ADL [facility]:   Initial Exercise Prescription: Initial Exercise Prescription - 10/18/18 1500      Date of Initial Exercise RX and Referring Provider   Date  10/18/18    Referring Provider  Fransico Him R     Expected Discharge Date  11/30/18      Treadmill   MPH  2.6    Grade  1    Minutes  15    METs  3.35      NuStep   Level  2    SPM  85    Minutes  15    METs  3      Prescription Details   Frequency (times per week)  3x    Duration  Progress to 30 minutes of continuous aerobic without signs/symptoms of physical distress      Intensity   THRR 40-80% of Max Heartrate  63-126    Ratings of Perceived Exertion  11-13    Perceived Dyspnea  0-4      Progression   Progression  Continue progressive overload as per policy without signs/symptoms or physical distress.      Resistance Training   Training Prescription  Yes    Weight  5lbs    Reps  10-15       Discharge Exercise Prescription (Final Exercise Prescription Changes): Exercise Prescription Changes - 11/30/18 1045      Response to Exercise   Blood Pressure (Admit)  118/64    Blood Pressure (Exercise)  168/80    Blood Pressure (Exit)  108/62    Heart Rate (Admit)  95 bpm    Heart Rate (Exercise)  140 bpm    Heart Rate (Exit)  102 bpm    Rating of Perceived Exertion (Exercise)  12    Symptoms  none    Duration  Continue with 30 min of aerobic exercise without signs/symptoms of physical distress.    Intensity  THRR unchanged      Progression   Progression  Continue to progress workloads to maintain intensity without signs/symptoms of physical distress.    Average METs  4.8      Resistance Training   Training Prescription  Yes    Weight  5lbs    Reps  10-15    Time  10 Minutes      Interval Training   Interval Training  No      Treadmill   MPH  3.5    Grade  1    Minutes  15    METs  4.16      NuStep   Level  5    SPM  85    Minutes  15    METs  5.4      Track   Laps  --    Minutes  --  METs  --      Home Exercise Plan   Plans to continue exercise at  Home (comment)   Walking   Frequency  Add 1 additional day to program exercise sessions.    Initial Home Exercises Provided  11/05/18       Functional Capacity: 6 Minute Walk    Row Name 10/18/18 1543 11/26/18 1046       6 Minute Walk   Phase  Initial  Discharge    Distance  1677 feet  1901 feet    Distance % Change  -  13.36 %    Walk Time  6 minutes  6 minutes    # of Rest Breaks  0  0    MPH  3.18  3.6    METS  3.45  4.18    RPE  11  11    Perceived Dyspnea   0  0    VO2 Peak  12.06  14.63    Symptoms  No  No    Resting HR  70 bpm  80 bpm    Resting BP  98/68  108/78    Resting Oxygen Saturation   97 %  -    Exercise Oxygen Saturation  during 6 min walk  98 %  -    Max Ex. HR  105 bpm  113 bpm    Max Ex. BP  145/88  166/62    2 Minute Post BP  123/83  108/64       Psychological, QOL, Others - Outcomes: PHQ 2/9: Depression screen [facility] 2/9 11/30/2018 10/22/2018 11/17/2017 09/02/2016 09/25/2015  Decreased Interest 0 0 0 0 0  Down, Depressed, Hopeless 0 0 0 0 0  PHQ - 2 Score 0 0 0 0 0    Quality of Life: Quality of Life - 11/26/18 1641      Quality of Life   Select  Quality of Life      Quality of Life Scores    Health/Function Pre  21.63 %    Health/Function Post  22.7 %    Health/Function % Change  4.95 %    Socioeconomic Pre  24.83 %    Socioeconomic Post  25.93 %    Socioeconomic % Change   4.43 %    Psych/Spiritual Pre  23 %    Psych/Spiritual Post  23.71 %    Psych/Spiritual % Change  3.09 %    Family Pre  22.8 %    Family Post  30 %    Family % Change  31.58 %    GLOBAL Pre  22.68 %    GLOBAL Post  24.65 %    GLOBAL % Change  8.69 %       Personal Goals: Goals established at orientation with interventions provided to work toward goal. Personal Goals and Risk Factors at Admission - 10/18/18 1555      Core Components/Risk Factors/Patient Goals on Admission   Hypertension  Yes    Intervention  Provide education on lifestyle modifcations including regular physical activity/exercise, weight management, moderate sodium restriction and increased consumption of fresh fruit, vegetables, and low fat dairy, alcohol moderation, and smoking cessation.;Monitor prescription use compliance.    Expected Outcomes  Short Term: Continued assessment and intervention until BP is < 140/30m HG in hypertensive participants. < 130/823mHG in hypertensive participants with diabetes, heart failure or chronic kidney disease.;Long Term: Maintenance of blood pressure at goal levels.    Lipids  Yes  Intervention  Provide education and support for participant on nutrition & aerobic/resistive exercise along with prescribed medications to achieve LDL <52m, HDL >490m    Expected Outcomes  Short Term: Participant states understanding of desired cholesterol values and is compliant with medications prescribed. Participant is following exercise prescription and nutrition guidelines.;Long Term: Cholesterol controlled with medications as prescribed, with individualized exercise RX and with personalized nutrition plan. Value goals: LDL < 7064mHDL > 40 mg.        Personal Goals Discharge: Goals and Risk Factor Review     Row Name 10/22/18 1154 10/29/18 0917 11/28/18 1000 11/30/18 1405       Core Components/Risk Factors/Patient Goals Review   Personal Goals Review  Hypertension;Lipids  Hypertension;Lipids  Hypertension;Lipids  Hypertension;Lipids    Review  Pt with few CAD RFs willing to participate in CR exercise.  MarJerrye Beaversuld like to lose weight and establish an exercise routine.  Pt with few CAD RFs willing to participate in CR exercise.  MarJerrye Beavers tolerating exercise well during his first week of CR.  30 Day ITP Review. MarJerrye Beaversntinues to tolerate exercise well.  He will graduate 11/30/2018 and feels that he is increasing his strength.  MarJerrye Beaversaduated 11/30/2018 and felt that he increased his strength.  He completed 12 sessions and had perfect attendance.    Expected Outcomes  Pt will continue to participate in CR exercise, nutrition, and lifestyle modification opportunities.  Pt will continue to participate in CR exercise, nutrition, and lifestyle modification opportunities.  Pt will continue to participate in CR exercise, nutrition, and lifestyle modification opportunities.  Pt will continue to participate in exercise, nutrition, and lifestyle modification opportunities.       Exercise Goals and Review: Exercise Goals    Row Name 10/18/18 1552             Exercise Goals   Increase Physical Activity  Yes       Intervention  Provide advice, education, support and counseling about physical activity/exercise needs.;Develop an individualized exercise prescription for aerobic and resistive training based on initial evaluation findings, risk stratification, comorbidities and participant's personal goals.       Expected Outcomes  Short Term: Attend rehab on a regular basis to increase amount of physical activity.;Long Term: Add in home exercise to make exercise part of routine and to increase amount of physical activity.;Long Term: Exercising regularly at least 3-5 days a week.       Increase Strength and Stamina   Yes       Intervention  Provide advice, education, support and counseling about physical activity/exercise needs.;Develop an individualized exercise prescription for aerobic and resistive training based on initial evaluation findings, risk stratification, comorbidities and participant's personal goals.       Expected Outcomes  Short Term: Increase workloads from initial exercise prescription for resistance, speed, and METs.;Short Term: Perform resistance training exercises routinely during rehab and add in resistance training at home;Long Term: Improve cardiorespiratory fitness, muscular endurance and strength as measured by increased METs and functional capacity (6MWT)       Able to understand and use rate of perceived exertion (RPE) scale  Yes       Intervention  Provide education and explanation on how to use RPE scale       Expected Outcomes  Short Term: Able to use RPE daily in rehab to express subjective intensity level;Long Term:  Able to use RPE to guide intensity level when exercising independently  Knowledge and understanding of Target Heart Rate Range (THRR)  Yes       Intervention  Provide education and explanation of THRR including how the numbers were predicted and where they are located for reference       Expected Outcomes  Long Term: Able to use THRR to govern intensity when exercising independently;Short Term: Able to use daily as guideline for intensity in rehab;Short Term: Able to state/look up THRR       Able to check pulse independently  Yes       Intervention  Provide education and demonstration on how to check pulse in carotid and radial arteries.;Review the importance of being able to check your own pulse for safety during independent exercise       Expected Outcomes  Short Term: Able to explain why pulse checking is important during independent exercise;Long Term: Able to check pulse independently and accurately       Understanding of Exercise Prescription  Yes        Intervention  Provide education, explanation, and written materials on patient's individual exercise prescription       Expected Outcomes  Short Term: Able to explain program exercise prescription;Long Term: Able to explain home exercise prescription to exercise independently          Exercise Goals Re-Evaluation: Exercise Goals Re-Evaluation    Row Name 10/22/18 1206 11/05/18 1106 11/26/18 1055 11/30/18 1130       Exercise Goal Re-Evaluation   Exercise Goals Review  Increase Physical Activity;Increase Strength and Stamina;Able to understand and use rate of perceived exertion (RPE) scale;Knowledge and understanding of Target Heart Rate Range (THRR);Able to check pulse independently;Understanding of Exercise Prescription  Increase Physical Activity;Increase Strength and Stamina;Able to understand and use rate of perceived exertion (RPE) scale;Knowledge and understanding of Target Heart Rate Range (THRR);Able to check pulse independently;Understanding of Exercise Prescription  Increase Physical Activity;Increase Strength and Stamina;Able to understand and use rate of perceived exertion (RPE) scale;Knowledge and understanding of Target Heart Rate Range (THRR);Able to check pulse independently;Understanding of Exercise Prescription  Increase Physical Activity;Increase Strength and Stamina;Able to understand and use rate of perceived exertion (RPE) scale;Knowledge and understanding of Target Heart Rate Range (THRR);Able to check pulse independently;Understanding of Exercise Prescription    Comments  Pt first day of exercise. Pt tolerated exercise prescription. Pt increased speed on treadmill and tolerated it well.  Reviewed home exercise guidelines with patient. 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 Pt will try to add at least 1 day of exercise walking in addition to exercise at cardiac rehab.  Patient will be completing the cardiac rehab program this week  and has progressed well with exercise. Pt has lost 4.1 kg since orientation, which was one of his goals, and has established a regular exericse routine. Pt is walking outdoors and has a treadmill at home, which he plans to use as his mode of exercise upon completion of the CR program. Pt aslo has access to 3lbs hand weights and plans to get a heavier set to use for his resistance. Pt's functional capacity improved 13% as measured by 6MT, strength imporved 10%, and flexibility improved 3%.  Patient completed the cardiac rehab program and has progressed well. Patient will continue exercise at home at this time walking and using treadmill.    Expected Outcomes  Will continue to monitor and progress Pt as tolerated.  Patient walk 30 minutes, at least 1 day  in addition to exericse at cardiac rehab.  Patient will continue exercise routine at home walking at least 30 minutes 3-4 days/week to maintain health and fitness gains.  Patient will continue exercise routine at home walking at least 30 minutes 3-4 days/week to maintain health and fitness gains.       Nutrition & Weight - Outcomes: Pre Biometrics - 10/18/18 1548      Pre Biometrics   Height  _0  (1.778 m)    Weight  124.7 kg    Waist Circumference  47.5 inches    Hip Circumference  52 inches    Waist to Hip Ratio  0.91 %    BMI (Calculated)  39.45    Triceps Skinfold  35 mm    % Body Fat  36 %    Grip Strength  46 kg    Flexibility  19 in    Single Leg Stand  5.18 seconds      Post Biometrics - 11/30/18 1045       Post  Biometrics   Height  _1  (1.778 m)    Weight  121.9 kg    Waist Circumference  45.5 inches    Hip Circumference  49.25 inches    Waist to Hip Ratio  0.92 %    BMI (Calculated)  38.56    Triceps Skinfold  26 mm    % Body Fat  36 %    Grip Strength  50.5 kg    Flexibility  19.5 in    Single Leg Stand  30 seconds       Nutrition:   Nutrition Discharge:   Education Questionnaire Score: Knowledge  Questionnaire Score - 11/26/18 1640      Knowledge Questionnaire Score   Pre Score  20/24    Post Score  24/24       Goals reviewed with patient; copy given to patient.

## 2019-01-08 ENCOUNTER — Other Ambulatory Visit: Payer: Self-pay | Admitting: Internal Medicine

## 2019-02-04 ENCOUNTER — Encounter: Payer: Self-pay | Admitting: Internal Medicine

## 2019-02-11 ENCOUNTER — Ambulatory Visit (INDEPENDENT_AMBULATORY_CARE_PROVIDER_SITE_OTHER): Payer: BC Managed Care – PPO | Admitting: Internal Medicine

## 2019-02-11 ENCOUNTER — Encounter: Payer: Self-pay | Admitting: Internal Medicine

## 2019-02-11 ENCOUNTER — Other Ambulatory Visit: Payer: Self-pay

## 2019-02-11 DIAGNOSIS — E1122 Type 2 diabetes mellitus with diabetic chronic kidney disease: Secondary | ICD-10-CM

## 2019-02-11 DIAGNOSIS — I872 Venous insufficiency (chronic) (peripheral): Secondary | ICD-10-CM | POA: Diagnosis not present

## 2019-02-11 DIAGNOSIS — E66813 Obesity, class 3: Secondary | ICD-10-CM

## 2019-02-11 DIAGNOSIS — I2510 Atherosclerotic heart disease of native coronary artery without angina pectoris: Secondary | ICD-10-CM | POA: Diagnosis not present

## 2019-02-11 DIAGNOSIS — Z794 Long term (current) use of insulin: Secondary | ICD-10-CM

## 2019-02-11 DIAGNOSIS — I10 Essential (primary) hypertension: Secondary | ICD-10-CM | POA: Diagnosis not present

## 2019-02-11 DIAGNOSIS — Z6841 Body Mass Index (BMI) 40.0 and over, adult: Secondary | ICD-10-CM

## 2019-02-11 DIAGNOSIS — N181 Chronic kidney disease, stage 1: Secondary | ICD-10-CM

## 2019-02-11 DIAGNOSIS — E559 Vitamin D deficiency, unspecified: Secondary | ICD-10-CM

## 2019-02-11 NOTE — Assessment & Plan Note (Signed)
Cont w/meds: Andrew Carson

## 2019-02-11 NOTE — Patient Instructions (Addendum)
These suggestions will probably help you to improve your metabolism if you are not overweight and to lose weight if you are overweight: 1.  Reduce your consumption of sugars and starches.  Eliminate high fructose corn syrup from your diet.  Reduce your consumption of processed foods.  For desserts try to have seasonal fruits, berries, nuts, cheeses or dark chocolate with more than 70% cacao. 2.  Do not snack 3.  You do not have to eat breakfast.  If you choose to have breakfast-eat plain greek yogurt, eggs, oatmeal (without sugar) 4.  Drink water, freshly brewed unsweetened tea (green, black or herbal) or coffee.  Do not drink sodas including diet sodas , juices, beverages sweetened with artificial sweeteners. 5.  Reduce your consumption of refined grains. 6.  Avoid protein drinks such as Optifast, Slim fast etc. Eat chicken, fish, meat, dairy and beans for your sources of protein 7.  Natural unprocessed fats like cold pressed virgin olive oil, butter, coconut oil are good for you.  Eat avocados 8.  Increase your consumption of fiber.  Fruits, berries, vegetables, whole grains, flaxseeds, Chia seeds, beans, popcorn, nuts, oatmeal are good sources of fiber 9.  Use vinegar in your diet, i.e. apple cider vinegar, red wine or balsamic vinegar 10.  You can try fasting.  For example you can skip breakfast and lunch every other day (24-hour fast) 11.  Stress reduction, good night sleep, relaxation, meditation, yoga and other physical activity is likely to help you to maintain low weight too. 12.  If you drink alcohol, limit your alcohol intake to no more than 2 drinks a day.   Mediterranean diet is good for you. ([employer] has a typical Mediterranean cuisine menu) The Mediterranean diet is a way of eating based on the traditional cuisine of countries bordering the [place] Sea. While there is no single definition of the Mediterranean diet, it is typically high in vegetables, fruits, whole grains,  beans, nut and seeds, and olive oil. The main components of Mediterranean diet include: . Daily consumption of vegetables, fruits, whole grains and healthy fats  . Weekly intake of fish, poultry, beans and eggs  . Moderate portions of dairy products  . Limited intake of red meat Other important elements of the Mediterranean diet are sharing meals with family and friends, enjoying a glass of red wine and being physically active. Health benefits of a Mediterranean diet: A traditional Mediterranean diet consisting of large quantities of fresh fruits and vegetables, nuts, fish and olive oil-coupled with physical activity-can reduce your risk of serious mental and physical health problems by: Preventing heart disease and strokes. Following a Mediterranean diet limits your intake of refined breads, processed foods, and red meat, and encourages drinking red wine instead of hard liquor-all factors that can help prevent heart disease and stroke. Keeping you agile. If you're an older adult, the nutrients gained with a Mediterranean diet may reduce your risk of developing muscle weakness and other signs of frailty by about 70 percent. Reducing the risk of Alzheimer's. Research suggests that the Mediterranean diet may improve cholesterol, blood sugar levels, and overall blood vessel health, which in turn may reduce your risk of Alzheimer's disease or dementia. Halving the risk of Parkinson's disease. The high levels of antioxidants in the Mediterranean diet can prevent cells from undergoing a damaging process called oxidative stress, thereby cutting the risk of Parkinson's disease in half. Increasing longevity. By reducing your risk of developing heart disease or cancer with the Mediterranean diet,   you're reducing your risk of death at any age by 20%. Protecting against type 2 diabetes. A Mediterranean diet is rich in fiber which digests slowly, prevents huge swings in blood sugar, and can help you maintain a  healthy weight.    Cabbage soup recipe that will not make you gain weight: Take 1 small head of cabbage, 1 average pack of celery, 4 green peppers, 4 onions, 2 cans diced tomatoes (they are not available without salt), salt and spices to taste.  Chop cabbage, celery, peppers and onions.  And tomatoes and 2-2.5 liters (2.5 quarts) of water so that it would just cover the vegetables.  Bring to boil.  Add spices and salt.  Turn heat to low/medium and simmer for 20-25 minutes.  Naturally, you can make a smaller batch and change some of the ingredients.   Emelda Brothers

## 2019-02-11 NOTE — Assessment & Plan Note (Signed)
No CP Cont w/meds: Dimitri Ped, Lipitor. Coreg

## 2019-02-11 NOTE — Progress Notes (Signed)
Subjective:  Patient ID: Andrew Carson, male    DOB: Oct 04, 1955  Age: 63 y.o. MRN: [id number]  CC: No chief complaint on file.   HPI Andrew Carson presents for DM, CAD, dyslipidemia f/u  Labs - 02/04/19 A1c 5.9%  Outpatient Medications Prior to Visit  Medication Sig Dispense Refill  . aspirin EC 81 MG tablet Take 1 tablet (81 mg total) by mouth daily. 100 tablet 3  . atorvastatin (LIPITOR) 80 MG tablet Take 1 tablet (80 mg total) by mouth daily. 90 tablet 3  . Bayer Microlet Lancets lancets Use to help check blood sugar twice a day 100 each 12  . benazepril-hydrochlorthiazide (LOTENSIN HCT) 20-12.5 MG tablet Take 1 tablet by mouth daily. 90 tablet 3  . BRILINTA 90 MG TABS tablet TAKE 1 TABLET BY MOUTH TWICE A DAY 180 tablet 3  . carvedilol (COREG) 12.5 MG tablet Take 1 tablet (12.5 mg total) by mouth 2 (two) times daily with a meal. 180 tablet 3  . [facility] D3 25 MCG (1000 UT) capsule TAKE 1 CAPSULE BY MOUTH EVERY DAY 90 capsule 3  . FARXIGA 10 MG TABS tablet TAKE 1 TABLET BY MOUTH EVERY DAY 90 tablet 3  . ferrous sulfate 325 (65 FE) MG tablet TAKE 1 TABLET BY MOUTH EVERY DAY 90 tablet 1  . glucose blood (CONTOUR NEXT TEST) test strip Use to check blood sugars twice day 100 each 12  . JANUMET 50-1000 MG tablet TAKE 1 TABLET BY MOUTH 2 (TWO) TIMES DAILY WITH A MEAL. 180 tablet 1  . ketoconazole (NIZORAL) 2 % cream Apply 1 application topically as needed for irritation.    . mometasone (ELOCON) 0.1 % cream APPLY TO AFFECTED AREA EVERY DAY 45 g 3  . nitroGLYCERIN (NITROSTAT) 0.4 MG SL tablet Place 0.4 mg under the tongue every 5 (five) minutes as needed for chest pain.    Marland Kitchen omeprazole (PRILOSEC) 20 MG capsule TAKE 1 CAPSULE BY MOUTH EVERY DAY 90 capsule 3   Facility-Administered Medications Prior to Visit  Medication Dose Route Frequency Provider Last Rate Last Dose  . 0.9 %  sodium chloride infusion  500 mL Intravenous Continuous Andrew Mayer, MD        ROS: Review of Systems   Constitutional: Negative for appetite change, fatigue and unexpected weight change.  HENT: Negative for congestion, nosebleeds, sneezing, sore throat and trouble swallowing.   Eyes: Negative for itching and visual disturbance.  Respiratory: Negative for cough.   Cardiovascular: Positive for leg swelling. Negative for chest pain and palpitations.  Gastrointestinal: Negative for abdominal distention, blood in stool, diarrhea and nausea.  Genitourinary: Negative for frequency and hematuria.  Musculoskeletal: Negative for back pain, gait problem, joint swelling and neck pain.  Skin: Negative for rash.  Neurological: Negative for dizziness, tremors, speech difficulty and weakness.  Psychiatric/Behavioral: Negative for agitation, dysphoric mood, sleep disturbance and suicidal ideas. The patient is not nervous/anxious.     Objective:  BP 126/70 (BP Location: Left Arm, Patient Position: Sitting, Cuff Size: Large)   Pulse 79   Temp 97.9 F (36.6 C) (Oral)   Ht 5\' 10"  (1.778 m)   Wt 262 lb (118.8 kg)   SpO2 98%   BMI 37.59 kg/m   BP Readings from Last 3 Encounters:  02/11/19 126/70  11/30/18 118/64  11/23/18 122/82    Wt Readings from Last 3 Encounters:  02/11/19 262 lb (118.8 kg)  12/03/18 264 lb (119.7 kg)  11/30/18 268 lb 11.9 oz (121.9  kg)    Physical Exam Constitutional:      General: He is not in acute distress.    Appearance: He is well-developed.     Comments: NAD  Eyes:     Conjunctiva/sclera: Conjunctivae normal.     Pupils: Pupils are equal, round, and reactive to light.  Neck:     Musculoskeletal: Normal range of motion.     Thyroid: No thyromegaly.     Vascular: No JVD.  Cardiovascular:     Rate and Rhythm: Normal rate and regular rhythm.     Heart sounds: Normal heart sounds. No murmur. No friction rub. No gallop.   Pulmonary:     Effort: Pulmonary effort is normal. No respiratory distress.     Breath sounds: Normal breath sounds. No wheezing or rales.   Chest:     Chest wall: No tenderness.  Abdominal:     General: Bowel sounds are normal. There is no distension.     Palpations: Abdomen is soft. There is no mass.     Tenderness: There is no abdominal tenderness. There is no guarding or rebound.  Musculoskeletal: Normal range of motion.        General: No tenderness.  Lymphadenopathy:     Cervical: No cervical adenopathy.  Skin:    General: Skin is warm and dry.     Findings: No rash.  Neurological:     Mental Status: He is alert and oriented to person, place, and time.     Cranial Nerves: No cranial nerve deficit.     Motor: No abnormal muscle tone.     Coordination: Coordination normal.     Gait: Gait normal.     Deep Tendon Reflexes: Reflexes are normal and symmetric.  Psychiatric:        Behavior: Behavior normal.        Thought Content: Thought content normal.        Judgment: Judgment normal.     Lab Results  Component Value Date   WBC 7.5 11/11/2017   HGB 13.2 (A) 11/11/2017   HCT 40 (A) 11/11/2017   PLT 293 11/11/2017   GLUCOSE 146 (H) 09/18/2015   CHOL 93 09/10/2018   TRIG 132 09/10/2018   HDL 30 (L) 09/10/2018   LDLDIRECT 149.9 03/12/2007   LDLCALC 41 09/10/2018   ALT 32 09/10/2018   AST 20 09/10/2018   NA 139 11/10/2017   K 4.2 11/10/2017   CL 105 09/18/2015   CREATININE 1.2 11/10/2017   BUN 20 11/10/2017   CO2 27 09/18/2015   TSH 1.77 12/20/2016   PSA 0.7 12/20/2016   HGBA1C 6.7 11/11/2017   MICROALBUR 0.2 12/20/2016    No results found.  Assessment & Plan:   There are no diagnoses linked to this encounter.   No orders of the defined types were placed in this encounter.    Follow-up: No follow-ups on file.  Sonda Primes, MD

## 2019-02-12 NOTE — Assessment & Plan Note (Signed)
Vitamin D

## 2019-02-12 NOTE — Assessment & Plan Note (Signed)
Wt Readings from Last 3 Encounters:  02/11/19 262 lb (118.8 kg)  12/03/18 264 lb (119.7 kg)  11/30/18 268 lb 11.9 oz (121.9 kg)

## 2019-02-12 NOTE — Assessment & Plan Note (Signed)
Mometasone cream Weight loss discussed

## 2019-02-12 NOTE — Assessment & Plan Note (Signed)
Continue with Lotensin HCT and Coreg

## 2019-02-18 ENCOUNTER — Ambulatory Visit: Payer: BC Managed Care – PPO | Admitting: Internal Medicine

## 2019-02-18 ENCOUNTER — Other Ambulatory Visit: Payer: Self-pay | Admitting: Cardiology

## 2019-02-18 ENCOUNTER — Other Ambulatory Visit: Payer: Self-pay | Admitting: Internal Medicine

## 2019-02-19 NOTE — Telephone Encounter (Signed)
Outpatient Medication Detail   Disp Refills Start End   carvedilol (COREG) 12.5 MG tablet 180 tablet 3 08/17/2018 08/17/2019   Sig - Route: Take 1 tablet (12.5 mg total) by mouth 2 (two) times daily with a meal. - Oral   Sent to pharmacy as: carvedilol (COREG) 12.5 MG tablet   E-Prescribing Status: Receipt confirmed by pharmacy (08/17/2018 11:14 AM EDT)   Pharmacy  [id number] - [place], [place]

## 2019-05-15 ENCOUNTER — Other Ambulatory Visit: Payer: Self-pay | Admitting: Cardiology

## 2019-05-20 ENCOUNTER — Other Ambulatory Visit: Payer: Self-pay | Admitting: Cardiology

## 2019-05-22 ENCOUNTER — Other Ambulatory Visit: Payer: Self-pay | Admitting: Cardiology

## 2019-05-22 MED ORDER — TICAGRELOR 90 MG PO TABS: 90.0000 mg | ORAL_TABLET | Freq: Two times a day (BID) | ORAL | 0 refills | Status: DC

## 2019-05-22 MED ORDER — CARVEDILOL 12.5 MG PO TABS: 12.5000 mg | ORAL_TABLET | Freq: Two times a day (BID) | ORAL | 0 refills | Status: DC

## 2019-05-22 NOTE — Telephone Encounter (Signed)
Pt called requesting a short supply of his medications until pt get back in town. Medication sent to pt's pharmacy as requested. Confirmation received.

## 2019-05-27 NOTE — Progress Notes (Signed)
Cardiology Office Note:    Date:  05/28/2019   ID:  Andrew Carson, DOB 18-Nov-1955, MRN [id number]  PCP:  Tresa Garter, MD  Cardiologist:  Armanda Magic, MD    Referring MD: Tresa Garter, MD   Chief Complaint  Patient presents with  . Coronary Artery Disease  . Hypertension  . Hyperlipidemia    History of Present Illness:    Andrew Carson is a 64 y.o. male with a hx of  HTN and Hyperlipidemia. 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 The pain was very severe and lasted 20 minutes. He stopped and called an ambulance and was given NGT and ASA. In ER in [place] he ruled in for MI with elevated troponin. He was transferred to [facility] and underwent cath showing a 90% LAD s/p PCI with residual dz with  70% distal LCx and distal RCA lesions. Since then he has done well with no further CP.   He is here today for followup and is doing well.  He denies any chest pain or pressure, SOB, DOE, PND, orthopnea, LE edema (except if he stands too long, dizziness, palpitations or syncope. He is compliant with his meds and is tolerating meds with no SE.    Past Medical History:  Diagnosis Date  . ABNORMAL GLUCOSE NEC 08/24/2007   Qualifier: Diagnosis of  By: Plotnikov MD, Georgina Quint   . Anemia 08/11/2017   Colon  2017 Dr Leone Payor Start iron 2019 GI ref if Hgb not better  . Coronary artery disease 08/07/2018   April 2020  [name] developed chest pain or shortness of breath in the [employer] truck while going through [place] on July 29, 2018, Sunday.  His partner was driving.  They stopped and called the ambulance.  The chest pain was severe and lasted for about 20 minutes.  He was given nitroglycerin and aspirin.  They took him to [facility] in [place], [place] on April 20 where he was found t  . Diabetes mellitus 2011   type 2  . Dyslipidemia (high LDL; low HDL) 10/15/2010   Chronic Cardiac  CT scoring info given 11/19  . Elevated PSA 09/06/2013   5/15 due to UTI Dr Annabell Howells   . Essential hypertension 02/23/2007   Chronic Lotensin HCT, Coreg  Cardiac CT scoring test offered 11/19  . Finger pain, right 02/11/2011   10/12 R 4th prox phal - likely a partial flexor tendon rupture   . GERD 03/12/2007    On Omeprazole  . GERD (gastroesophageal reflux disease)   . Hypertension   . Hypogonadism male   . KNEE PAIN 03/12/2007   11/19 L   . Myocardial infarction [facility]) 08/07/2018   July 29, 2018  [name] developed chest pain or shortness of breath in the [employer] truck while going through [place] on July 29, 2018, Sunday.  His partner was driving.  They stopped and called the ambulance.  The chest pain was severe and lasted for about 20 minutes.  He was given nitroglycerin and aspirin.  They took him to [facility] in [place], [place] on April 20 where he was fou  . Obesity   . Osteoarthritis   . OSTEOARTHRITIS 03/12/2007   Chronic     . Phimosis 01/29/2016   10/17 candida due to [place] - tolerable  . UPPER RESPIRATORY INFECTION, ACUTE 06/11/2010   Qualifier: Diagnosis of  By: Posey Rea MD, Macarthur Critchley  V   . URTICARIA 06/05/2009   Qualifier: Diagnosis of  By: Plotnikov MD, Georgina Quint   . UTI (urinary tract infection) 09/06/2013   5/15 dr Annabell Howells   . Venous insufficiency 08/19/2011   B LE mild R>L   . Vitamin D deficiency 03/14/2007   Chronic       Past Surgical History:  Procedure Laterality Date  . LUMBAR LAMINECTOMY  07/1995   L5-S1  . WISDOM TOOTH EXTRACTION      Current Medications: Current Meds  Medication Sig  . aspirin EC 81 MG tablet Take 1 tablet (81 mg total) by mouth daily.  Marland Kitchen atorvastatin (LIPITOR) 80 MG tablet Take 1 tablet (80 mg total) by mouth daily.  Ramond Dial Microlet Lancets lancets Use to help check blood sugar twice a day  . benazepril-hydrochlorthiazide (LOTENSIN HCT) 20-12.5 MG tablet Take 1 tablet by mouth daily.  . carvedilol (COREG) 12.5 MG tablet Take  1 tablet (12.5 mg total) by mouth 2 (two) times daily with a meal.  . CVS D3 25 MCG (1000 UT) capsule TAKE 1 CAPSULE BY MOUTH EVERY DAY  . FARXIGA 10 MG TABS tablet TAKE 1 TABLET BY MOUTH EVERY DAY  . ferrous sulfate 325 (65 FE) MG tablet TAKE 1 TABLET BY MOUTH EVERY DAY  . glucose blood (CONTOUR NEXT TEST) test strip Use to check blood sugars twice day  . JANUMET 50-1000 MG tablet TAKE 1 TABLET BY MOUTH 2 (TWO) TIMES DAILY WITH A MEAL.  Marland Kitchen ketoconazole (NIZORAL) 2 % cream Apply 1 application topically as needed for irritation.  . mometasone (ELOCON) 0.1 % cream APPLY TO AFFECTED AREA EVERY DAY  . nitroGLYCERIN (NITROSTAT) 0.4 MG SL tablet Place 0.4 mg under the tongue every 5 (five) minutes as needed for chest pain.  Marland Kitchen omeprazole (PRILOSEC) 20 MG capsule TAKE 1 CAPSULE BY MOUTH EVERY DAY  . ticagrelor (BRILINTA) 90 MG TABS tablet Take 1 tablet (90 mg total) by mouth 2 (two) times daily.   Current Facility-Administered Medications for the 05/28/19 encounter (Office Visit) with Quintella Reichert, MD  Medication  . 0.9 %  sodium chloride infusion     Allergies:   Triamcinolone   Social History   Socioeconomic History  . Marital status: Married    Spouse name: Not on file  . Number of children: Not on file  . Years of education: Not on file  . Highest education level: Not on file  Occupational History  . Not on file  Tobacco Use  . Smoking status: Never Smoker  . Smokeless tobacco: Never Used  Substance and Sexual Activity  . Alcohol use: No  . Drug use: No  . Sexual activity: Yes  Other Topics Concern  . Not on file  Social History Narrative   Truck driver   Married 6 kids   Social Determinants of Health   Financial Resource Strain:   . Difficulty of Paying Living Expenses: Not on file  Food Insecurity:   . Worried About Programme researcher, broadcasting/film/video in the Last Year: Not on file  . Ran Out of Food in the Last Year: Not on file  Transportation Needs:   . Lack of Transportation  (Medical): Not on file  . Lack of Transportation (Non-Medical): Not on file  Physical Activity:   . Days of Exercise per Week: Not on file  . Minutes of Exercise per Session: Not on file  Stress:   . Feeling of Stress : Not on file  Social Connections:   .  Frequency of Communication with Friends and Family: Not on file  . Frequency of Social Gatherings with Friends and Family: Not on file  . Attends Religious Services: Not on file  . Active Member of Clubs or Organizations: Not on file  . Attends Banker Meetings: Not on file  . Marital Status: Not on file     Family History: The patient's family history includes COPD in his mother; Heart disease (age of onset: 60) in his father; Heart disease (age of onset: 10) in his mother; Kidney disease in an other family member. There is no history of Colon cancer, Esophageal cancer, Rectal cancer, or Stomach cancer.  ROS:   Please see the history of present illness.    ROS  All other systems reviewed and negative.   EKGs/Labs/Other Studies Reviewed:    The following studies were reviewed today: Outside labs from [facility]  EKG:  EKG is  ordered today.  The ekg ordered today demonstrates NSR with nonspecific ST/T wave abnormality  Recent Labs: 09/10/2018: ALT 32   Recent Lipid Panel    Component Value Date/Time   CHOL 93 09/10/2018 1146   TRIG 132 09/10/2018 1146   HDL 30 (L) 09/10/2018 1146   CHOLHDL 3.1 09/10/2018 1146   VLDL 30 09/18/2015 0817   LDLCALC 41 09/10/2018 1146   LDLDIRECT 149.9 03/12/2007 0834    Physical Exam:    VS:  BP 130/76   Pulse 71   Ht 5\' 10"  (1.778 m)   Wt 265 lb 9.6 oz (120.5 kg)   SpO2 98%   BMI 38.11 kg/m     Wt Readings from Last 3 Encounters:  05/28/19 265 lb 9.6 oz (120.5 kg)  02/11/19 262 lb (118.8 kg)  12/03/18 264 lb (119.7 kg)     GEN:  Well nourished, well developed in no acute distress HEENT: Normal NECK: No JVD; No carotid bruits LYMPHATICS: No lymphadenopathy CARDIAC:  RRR, no murmurs, rubs, gallops RESPIRATORY:  Clear to auscultation without rales, wheezing or rhonchi  ABDOMEN: Soft, non-tender, non-distended MUSCULOSKELETAL:  No edema; No deformity  SKIN: Warm and dry NEUROLOGIC:  Alert and oriented x 3 PSYCHIATRIC:  Normal affect   ASSESSMENT:    1. Coronary artery disease involving native coronary artery of native heart without angina pectoris   2. Essential hypertension   3. Dyslipidemia (high LDL; low HDL)   4. DM type 2, goal 12/05/18 < 7% (HCC)   5. Morbid obesity (HCC)    PLAN:    In order of problems listed above:  1.  ASCAD -s/p NSTEMI in QPR9F with cath showing 90% LAD and 70% distal LCx and distal RCA lesions.  S/P PCI of the LAD 07/2018. -he denies any anginal sx -continue ASA 81mg  daily, statin, Carvedilol 12.5mg  BID and Brilinta 90mg  BID -change from Brilinta to Plavix 75mg  daily starting in May 2020 as he will be a year out from PCI  2.  HTN -BP controlled -continue Benazepril-HCT 20-12.5mg  daily, Carvedilol 12.5mg  BID -Creatinine on outside labs was 1.2 and K+ 4.2.  I will repeat a BMET  3.  HLD -LDL goal < 70 -LDL was on outside labs was 41 in June 2020 -continue atorvastatin 80mg  daily -repeat FLp and ALT in June  4.  Type 2 DM -followed by PCP -HbA1C from outside labs was 6.7% in 2019 -continue Janumet 50-1000mg  BID -continue statin and ACE I  5.  Morbid Obesity -I have encouraged him to get into a routine exercise program and  cut back on carbs and portions.    Medication Adjustments/Labs and Tests Ordered: Current medicines are reviewed at length with the patient today.  Concerns regarding medicines are outlined above.  No orders of the defined types were placed in this encounter.  No orders of the defined types were placed in this encounter.   Signed, Fransico Him, MD  05/28/2019 8:18 AM    [place]

## 2019-05-28 ENCOUNTER — Other Ambulatory Visit: Payer: Self-pay

## 2019-05-28 ENCOUNTER — Encounter: Payer: Self-pay | Admitting: Cardiology

## 2019-05-28 ENCOUNTER — Ambulatory Visit (INDEPENDENT_AMBULATORY_CARE_PROVIDER_SITE_OTHER): Payer: BC Managed Care – PPO | Admitting: Cardiology

## 2019-05-28 VITALS — BP 130/76 | HR 71 | Ht 70.0 in | Wt 265.6 lb

## 2019-05-28 DIAGNOSIS — E785 Hyperlipidemia, unspecified: Secondary | ICD-10-CM | POA: Diagnosis not present

## 2019-05-28 DIAGNOSIS — I2510 Atherosclerotic heart disease of native coronary artery without angina pectoris: Secondary | ICD-10-CM | POA: Diagnosis not present

## 2019-05-28 DIAGNOSIS — I10 Essential (primary) hypertension: Secondary | ICD-10-CM

## 2019-05-28 DIAGNOSIS — E119 Type 2 diabetes mellitus without complications: Secondary | ICD-10-CM | POA: Diagnosis not present

## 2019-05-28 LAB — BASIC METABOLIC PANEL
BUN/Creatinine Ratio: 18 (ref 10–24)
BUN: 18 mg/dL (ref 8–27)
CO2: 21 mmol/L (ref 20–29)
Calcium: 9.2 mg/dL (ref 8.6–10.2)
Chloride: 106 mmol/L (ref 96–106)
Creatinine, Ser: 1.01 mg/dL (ref 0.76–1.27)
GFR calc Af Amer: 91 mL/min/{1.73_m2} (ref >59)
GFR calc non Af Amer: 79 mL/min/{1.73_m2} (ref >59)
Glucose: 125 mg/dL — ABNORMAL HIGH (ref 65–99)
Potassium: 3.9 mmol/L (ref 3.5–5.2)
Sodium: 142 mmol/L (ref 134–144)

## 2019-05-28 MED ORDER — CLOPIDOGREL BISULFATE 75 MG PO TABS: 75.0000 mg | ORAL_TABLET | Freq: Every day | ORAL | 3 refills | Status: DC

## 2019-05-28 NOTE — Patient Instructions (Signed)
Medication Instructions:  Your physician has recommended you make the following change in your medication:  1) Starting May 1st, 2021 STOP taking Brilinta  2) Starting May 1st, 2021 START taking Plavix (clopidogrel) 75 mg daily   *If you need a refill on your cardiac medications before your next appointment, please call your pharmacy*  Lab Work: TODAY: BMET June 2021: FLP and ALT  If you have labs (blood work) drawn today and your tests are completely normal, you will receive your results only by: Marland Kitchen MyChart Message (if you have MyChart) OR . A paper copy in the mail If you have any lab test that is abnormal or we need to change your treatment, we will call you to review the results.  Follow-Up: At [facility], you and your health needs are our priority.  As part of our continuing mission to provide you with exceptional heart care, we have created designated Provider Care Teams.  These Care Teams include your primary Cardiologist (physician) and Advanced Practice Providers (APPs -  Physician Assistants and Nurse Practitioners) who all work together to provide you with the care you need, when you need it.  Your next appointment:   1 year(s)  The format for your next appointment:   Either In Person or Virtual  Provider:   Armanda Magic, MD

## 2019-06-10 ENCOUNTER — Encounter: Payer: Self-pay | Admitting: Internal Medicine

## 2019-06-17 ENCOUNTER — Encounter: Payer: Self-pay | Admitting: Internal Medicine

## 2019-06-17 ENCOUNTER — Other Ambulatory Visit: Payer: Self-pay

## 2019-06-17 ENCOUNTER — Ambulatory Visit (INDEPENDENT_AMBULATORY_CARE_PROVIDER_SITE_OTHER): Payer: BC Managed Care – PPO | Admitting: Internal Medicine

## 2019-06-17 VITALS — BP 132/82 | HR 64 | Temp 98.6°F | Ht 70.0 in | Wt 263.0 lb

## 2019-06-17 DIAGNOSIS — E559 Vitamin D deficiency, unspecified: Secondary | ICD-10-CM

## 2019-06-17 DIAGNOSIS — Z0000 Encounter for general adult medical examination without abnormal findings: Secondary | ICD-10-CM

## 2019-06-17 DIAGNOSIS — I872 Venous insufficiency (chronic) (peripheral): Secondary | ICD-10-CM

## 2019-06-17 MED ORDER — KETOCONAZOLE 2 % EX CREA: 1.0000 "application " | TOPICAL_CREAM | CUTANEOUS | 3 refills | Status: DC | PRN

## 2019-06-17 MED ORDER — VASCULERA PO TABS: 1.0000 | ORAL_TABLET | Freq: Every day | ORAL | 3 refills | Status: DC

## 2019-06-17 NOTE — Assessment & Plan Note (Signed)
Vasculera offered

## 2019-06-17 NOTE — Assessment & Plan Note (Signed)
Vit D 

## 2019-06-17 NOTE — Progress Notes (Signed)
Subjective:  Patient ID: Andrew Carson, male    DOB: [birth year]  Age: 64 y.o. MRN: [id number]  CC: No chief complaint on file.   HPI Andrew Carson presents for DM, HTN, dyslipidemia, CAD f/u Well exam  Outpatient Medications Prior to Visit  Medication Sig Dispense Refill  . aspirin EC 81 MG tablet Take 1 tablet (81 mg total) by mouth daily. 100 tablet 3  . atorvastatin (LIPITOR) 80 MG tablet Take 1 tablet (80 mg total) by mouth daily. 90 tablet 0  . Bayer Microlet Lancets lancets Use to help check blood sugar twice a day 100 each 12  . benazepril-hydrochlorthiazide (LOTENSIN HCT) 20-12.5 MG tablet Take 1 tablet by mouth daily. 90 tablet 3  . carvedilol (COREG) 12.5 MG tablet Take 1 tablet (12.5 mg total) by mouth 2 (two) times daily with a meal. 10 tablet 0  . [START ON 08/10/2019] clopidogrel (PLAVIX) 75 MG tablet Take 1 tablet (75 mg total) by mouth daily. 90 tablet 3  . [facility] D3 25 MCG (1000 UT) capsule TAKE 1 CAPSULE BY MOUTH EVERY DAY 90 capsule 3  . FARXIGA 10 MG TABS tablet TAKE 1 TABLET BY MOUTH EVERY DAY 90 tablet 3  . ferrous sulfate 325 (65 FE) MG tablet TAKE 1 TABLET BY MOUTH EVERY DAY 90 tablet 1  . glucose blood (CONTOUR NEXT TEST) test strip Use to check blood sugars twice day 100 each 12  . JANUMET 50-1000 MG tablet TAKE 1 TABLET BY MOUTH 2 (TWO) TIMES DAILY WITH A MEAL. 180 tablet 1  . mometasone (ELOCON) 0.1 % cream APPLY TO AFFECTED AREA EVERY DAY 45 g 3  . nitroGLYCERIN (NITROSTAT) 0.4 MG SL tablet Place 0.4 mg under the tongue every 5 (five) minutes as needed for chest pain.    Marland Kitchen omeprazole (PRILOSEC) 20 MG capsule TAKE 1 CAPSULE BY MOUTH EVERY DAY 90 capsule 3  . ticagrelor (BRILINTA) 90 MG TABS tablet Take 1 tablet (90 mg total) by mouth 2 (two) times daily. 10 tablet 0  . ketoconazole (NIZORAL) 2 % cream Apply 1 application topically as needed for irritation.     Facility-Administered Medications Prior to Visit  Medication Dose Route Frequency Provider Last  Rate Last Admin  . 0.9 %  sodium chloride infusion  500 mL Intravenous Continuous Gatha Mayer, MD        ROS: Review of Systems  Constitutional: Negative for appetite change, fatigue and unexpected weight change.  HENT: Negative for congestion, nosebleeds, sneezing, sore throat and trouble swallowing.   Eyes: Negative for itching and visual disturbance.  Respiratory: Negative for cough.   Cardiovascular: Negative for chest pain, palpitations and leg swelling.  Gastrointestinal: Negative for abdominal distention, blood in stool, diarrhea and nausea.  Genitourinary: Negative for frequency and hematuria.  Musculoskeletal: Negative for back pain, gait problem, joint swelling and neck pain.  Skin: Positive for rash.  Neurological: Negative for dizziness, tremors, speech difficulty and weakness.  Psychiatric/Behavioral: Negative for agitation, dysphoric mood and sleep disturbance. The patient is not nervous/anxious.     Objective:  BP 132/82 (BP Location: Left Arm, Patient Position: Sitting, Cuff Size: Large)   Pulse 64   Temp 98.6 F (37 C) (Oral)   Ht 5\' 10"  (1.778 m)   Wt 263 lb (119.3 kg)   SpO2 98%   BMI 37.74 kg/m   BP Readings from Last 3 Encounters:  06/17/19 132/82  05/28/19 130/76  02/11/19 126/70    Wt Readings from Last 3  Encounters:  06/17/19 263 lb (119.3 kg)  05/28/19 265 lb 9.6 oz (120.5 kg)  02/11/19 262 lb (118.8 kg)    Physical Exam Constitutional:      General: He is not in acute distress.    Appearance: He is well-developed. He is obese.     Comments: NAD  Eyes:     Conjunctiva/sclera: Conjunctivae normal.     Pupils: Pupils are equal, round, and reactive to light.  Neck:     Thyroid: No thyromegaly.     Vascular: No JVD.  Cardiovascular:     Rate and Rhythm: Normal rate and regular rhythm.     Heart sounds: Normal heart sounds. No murmur. No friction rub. No gallop.   Pulmonary:     Effort: Pulmonary effort is normal. No respiratory  distress.     Breath sounds: Normal breath sounds. No wheezing or rales.  Chest:     Chest wall: No tenderness.  Abdominal:     General: Bowel sounds are normal. There is no distension.     Palpations: Abdomen is soft. There is no mass.     Tenderness: There is no abdominal tenderness. There is no guarding or rebound.  Genitourinary:    Prostate: Normal.     Rectum: Normal. Guaiac result negative.  Musculoskeletal:        General: No tenderness. Normal range of motion.     Cervical back: Normal range of motion.  Lymphadenopathy:     Cervical: No cervical adenopathy.  Skin:    General: Skin is warm and dry.     Findings: Rash present.  Neurological:     Mental Status: He is alert and oriented to person, place, and time.     Cranial Nerves: No cranial nerve deficit.     Motor: No abnormal muscle tone.     Coordination: Coordination normal.     Gait: Gait normal.     Deep Tendon Reflexes: Reflexes are normal and symmetric.  Psychiatric:        Behavior: Behavior normal.        Thought Content: Thought content normal.        Judgment: Judgment normal.   R shin rash  Lab Results  Component Value Date   WBC 7.5 11/11/2017   HGB 13.2 (A) 11/11/2017   HCT 40 (A) 11/11/2017   PLT 293 11/11/2017   GLUCOSE 125 (H) 05/28/2019   CHOL 93 09/10/2018   TRIG 132 09/10/2018   HDL 30 (L) 09/10/2018   LDLDIRECT 149.9 03/12/2007   LDLCALC 41 09/10/2018   ALT 32 09/10/2018   AST 20 09/10/2018   NA 142 05/28/2019   K 3.9 05/28/2019   CL 106 05/28/2019   CREATININE 1.01 05/28/2019   BUN 18 05/28/2019   CO2 21 05/28/2019   TSH 1.77 12/20/2016   PSA 0.7 12/20/2016   HGBA1C 6.7 11/11/2017   MICROALBUR 0.2 12/20/2016    No results found.  Assessment & Plan:   There are no diagnoses linked to this encounter.   Meds ordered this encounter  Medications  . ketoconazole (NIZORAL) 2 % cream    Sig: Apply 1 application topically as needed for irritation.    Dispense:  120 g     Refill:  3     Follow-up: No follow-ups on file.  Sonda Primes, MD

## 2019-06-17 NOTE — Assessment & Plan Note (Signed)
We discussed age appropriate health related issues, including available/recomended screening tests and vaccinations. We discussed a need for adhering to healthy diet and exercise. Labs were ordered to be later reviewed . All questions were answered.   

## 2019-07-01 ENCOUNTER — Ambulatory Visit: Payer: Self-pay

## 2019-07-01 ENCOUNTER — Other Ambulatory Visit: Payer: Self-pay | Admitting: Family Medicine

## 2019-07-01 ENCOUNTER — Emergency Department (HOSPITAL_COMMUNITY)
Admission: EM | Admit: 2019-07-01 | Discharge: 2019-07-01 | Disposition: A | Discharge status: Home / Self Care | Payer: No Typology Code available for payment source | Attending: Emergency Medicine | Admitting: Emergency Medicine

## 2019-07-01 ENCOUNTER — Other Ambulatory Visit: Payer: Self-pay

## 2019-07-01 ENCOUNTER — Encounter (HOSPITAL_COMMUNITY): Payer: Self-pay

## 2019-07-01 DIAGNOSIS — M71121 Other infective bursitis, right elbow: Secondary | ICD-10-CM | POA: Diagnosis not present

## 2019-07-01 DIAGNOSIS — M25521 Pain in right elbow: Secondary | ICD-10-CM

## 2019-07-01 DIAGNOSIS — Z7982 Long term (current) use of aspirin: Secondary | ICD-10-CM | POA: Diagnosis not present

## 2019-07-01 DIAGNOSIS — E119 Type 2 diabetes mellitus without complications: Secondary | ICD-10-CM | POA: Diagnosis not present

## 2019-07-01 DIAGNOSIS — Z79899 Other long term (current) drug therapy: Secondary | ICD-10-CM | POA: Diagnosis not present

## 2019-07-01 DIAGNOSIS — I10 Essential (primary) hypertension: Secondary | ICD-10-CM | POA: Diagnosis not present

## 2019-07-01 DIAGNOSIS — I2510 Atherosclerotic heart disease of native coronary artery without angina pectoris: Secondary | ICD-10-CM | POA: Diagnosis not present

## 2019-07-01 DIAGNOSIS — Z7902 Long term (current) use of antithrombotics/antiplatelets: Secondary | ICD-10-CM | POA: Insufficient documentation

## 2019-07-01 DIAGNOSIS — M7021 Olecranon bursitis, right elbow: Secondary | ICD-10-CM

## 2019-07-01 LAB — COMPREHENSIVE METABOLIC PANEL
ALT: 33 U/L (ref 0–44)
AST: 23 U/L (ref 15–41)
Albumin: 3.6 g/dL (ref 3.5–5.0)
Alkaline Phosphatase: 85 U/L (ref 38–126)
Anion gap: 9 (ref 5–15)
BUN: 19 mg/dL (ref 8–23)
CO2: 24 mmol/L (ref 22–32)
Calcium: 9 mg/dL (ref 8.9–10.3)
Chloride: 105 mmol/L (ref 98–111)
Creatinine, Ser: 0.86 mg/dL (ref 0.61–1.24)
GFR calc Af Amer: >60 mL/min (ref >60)
GFR calc non Af Amer: >60 mL/min (ref >60)
Glucose, Bld: 134 mg/dL — ABNORMAL HIGH (ref 70–99)
Potassium: 3.7 mmol/L (ref 3.5–5.1)
Sodium: 138 mmol/L (ref 135–145)
Total Bilirubin: 0.7 mg/dL (ref 0.3–1.2)
Total Protein: 7.1 g/dL (ref 6.5–8.1)

## 2019-07-01 LAB — CBC WITH DIFFERENTIAL/PLATELET
Abs Immature Granulocytes: 0.04 10*3/uL (ref 0.00–0.07)
Basophils Absolute: 0.1 10*3/uL (ref 0.0–0.1)
Basophils Relative: 1 %
Eosinophils Absolute: 0.1 10*3/uL (ref 0.0–0.5)
Eosinophils Relative: 1 %
HCT: 44.7 % (ref 39.0–52.0)
Hemoglobin: 14.6 g/dL (ref 13.0–17.0)
Immature Granulocytes: 0 %
Lymphocytes Relative: 13 %
Lymphs Abs: 1.4 10*3/uL (ref 0.7–4.0)
MCH: 28.1 pg (ref 26.0–34.0)
MCHC: 32.7 g/dL (ref 30.0–36.0)
MCV: 86.1 fL (ref 80.0–100.0)
Monocytes Absolute: 1.4 10*3/uL — ABNORMAL HIGH (ref 0.1–1.0)
Monocytes Relative: 13 %
Neutro Abs: 7.9 10*3/uL — ABNORMAL HIGH (ref 1.7–7.7)
Neutrophils Relative %: 72 %
Platelets: 272 10*3/uL (ref 150–400)
RBC: 5.19 MIL/uL (ref 4.22–5.81)
RDW: 13.7 % (ref 11.5–15.5)
WBC: 10.8 10*3/uL — ABNORMAL HIGH (ref 4.0–10.5)
nRBC: 0 % (ref 0.0–0.2)

## 2019-07-01 LAB — LACTIC ACID, PLASMA: Lactic Acid, Venous: 1.1 mmol/L (ref 0.5–1.9)

## 2019-07-01 MED ORDER — DOXYCYCLINE HYCLATE 100 MG PO CAPS: 100.0000 mg | ORAL_CAPSULE | Freq: Two times a day (BID) | ORAL | 0 refills | Status: DC

## 2019-07-01 NOTE — Discharge Instructions (Signed)
Please pick up antibiotics and take as prescribed for the next 10 days to cover for your infection Call Dr. Shelba Flake office to schedule an appointment for Wednesday Keep splint on until you are seen by ortho. Do not get splint wet.  You can take Ibuprofen and Tylenol as needed for pain

## 2019-07-01 NOTE — ED Triage Notes (Addendum)
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 Sent by employer and seen at [facility] for issue, states xray taken today, sent here for further evaluation.  Todays Xray in results in chart

## 2019-07-01 NOTE — ED Notes (Signed)
Pt unable to sign- arm in sling. Verbalized understanding, no additional questions

## 2019-07-01 NOTE — ED Provider Notes (Signed)
[facility]-EMERGENCY DEPT Provider Note   CSN: [id number] Arrival date & time: 07/01/19  1055     History Chief Complaint  Patient presents with  . Elbow Pain    Andrew Carson is a 64 y.o. male who presents to the ED today complaining of sudden onset, constant, worsening, right elbow pain x 4 days. 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 He reports that since then it has gotten more painful and swollen. He states the swelling has proceeded down his forearm. He did not notice any red streaking until this morning prior to an appointment at [facility]. They did an xray there (see below) with findings of nonspecific subcutaneous swelling and was sent here for further evaluation. Pt is currently on Plavix and Brillinta following stent placement April 2020; he reports he missed his doses today due to his appointment at 10 o'clock this morning. He denies missing any other doses recently. Denies fevers, chills, weakness, numbness, tingling, or any other associated symptoms.    RIGHT ELBOW - COMPLETE 3+ VIEW COMPARISON:  None. FINDINGS: There is no fracture or dislocation or joint effusion. There are slight arthritic changes of the elbow joint. Prominent enthesophyte at the triceps insertion. There is nonspecific subcutaneous edema seen posteriorly and medially at the left elbow. IMPRESSION: No acute osseous abnormality. Nonspecific subcutaneous edema  The history is provided by the patient and medical records.       Past Medical History:  Diagnosis Date  . ABNORMAL GLUCOSE NEC 08/24/2007   Qualifier: Diagnosis of  By: Plotnikov MD, Georgina Quint   . Anemia 08/11/2017   Colon  2017 Dr Leone Payor Start iron 2019 GI ref if Hgb not better  . Coronary artery disease 08/07/2018   April 2020  [name] developed chest pain or shortness of breath in the [employer] truck while going  through [place] on July 29, 2018, Sunday.  His partner was driving.  They stopped and called the ambulance.  The chest pain was severe and lasted for about 20 minutes.  He was given nitroglycerin and aspirin.  They took him to [facility] in [place], [place] on April 20 where he was found t  . Diabetes mellitus 2011   type 2  . Dyslipidemia (high LDL; low HDL) 10/15/2010   Chronic Cardiac CT scoring info given 11/19  . Elevated PSA 09/06/2013   5/15 due to UTI Dr Annabell Howells   . Essential hypertension 02/23/2007   Chronic Lotensin HCT, Coreg  Cardiac CT scoring test offered 11/19  . Finger pain, right 02/11/2011   10/12 R 4th prox phal - likely a partial flexor tendon rupture   . GERD 03/12/2007    On Omeprazole  . GERD (gastroesophageal reflux disease)   . Hypertension   . Hypogonadism male   . KNEE PAIN 03/12/2007   11/19 L   . Myocardial infarction [facility]) 08/07/2018   July 29, 2018  [name] developed chest pain or shortness of breath in the [employer] truck while going through [place] on July 29, 2018, Sunday.  His partner was driving.  They stopped and called the ambulance.  The chest pain was severe and lasted for about 20 minutes.  He was given nitroglycerin and aspirin.  They took him to [facility] in [place], [place] on April 20 where he was fou  . Obesity   . Osteoarthritis   .  OSTEOARTHRITIS 03/12/2007   Chronic     . Phimosis 01/29/2016   10/17 candida due to [place] - tolerable  . UPPER RESPIRATORY INFECTION, ACUTE 06/11/2010   Qualifier: Diagnosis of  By: Posey Rea MD, Georgina Quint URTICARIA 06/05/2009   Qualifier: Diagnosis of  By: Posey Rea MD, Georgina Quint   . UTI (urinary tract infection) 09/06/2013   5/15 dr Annabell Howells   . Venous insufficiency 08/19/2011   B LE mild R>L   . Vitamin D deficiency 03/14/2007   Chronic       Patient Active Problem List   Diagnosis Date Noted  . Stasis dermatitis 10/18/2018  . Myocardial infarction (HCC) 08/07/2018  . Coronary  artery disease 08/07/2018  . Anemia 08/11/2017  . Phimosis 01/29/2016  . Well adult exam 05/09/2014  . UTI (urinary tract infection) 09/06/2013  . Elevated PSA 09/06/2013  . Venous insufficiency 08/19/2011  . Hypogonadism male 02/11/2011  . Finger pain, right 02/11/2011  . Dyslipidemia (high LDL; low HDL) 10/15/2010  . UPPER RESPIRATORY INFECTION, ACUTE 06/11/2010  . DM2 (diabetes mellitus, type 2) (HCC) 02/05/2010  . URTICARIA 06/05/2009  . Obesity 08/24/2007  . ABNORMAL GLUCOSE NEC 08/24/2007  . Vitamin D deficiency 03/14/2007  . GERD 03/12/2007  . OSTEOARTHRITIS 03/12/2007  . KNEE PAIN 03/12/2007  . Essential hypertension 02/23/2007    Past Surgical History:  Procedure Laterality Date  . LUMBAR LAMINECTOMY  07/1995   L5-S1  . WISDOM TOOTH EXTRACTION         Family History  Problem Relation Age of Onset  . COPD Mother   . Heart disease Mother 55       MI  . Heart disease Father 30       MI  . Kidney disease Other   . Colon cancer Neg Hx   . Esophageal cancer Neg Hx   . Rectal cancer Neg Hx   . Stomach cancer Neg Hx     Social History   Tobacco Use  . Smoking status: Never Smoker  . Smokeless tobacco: Never Used  Substance Use Topics  . Alcohol use: No  . Drug use: No    Home Medications Prior to Admission medications   Medication Sig Start Date End Date Taking? Authorizing Provider  aspirin EC 81 MG tablet Take 1 tablet (81 mg total) by mouth daily. 08/07/18 08/07/19  Plotnikov, Georgina Quint, MD  atorvastatin (LIPITOR) 80 MG tablet Take 1 tablet (80 mg total) by mouth daily. 05/20/19   Quintella Reichert, MD  Bayer Microlet Lancets lancets Use to help check blood sugar twice a day 11/02/18   Plotnikov, Georgina Quint, MD  benazepril-hydrochlorthiazide (LOTENSIN HCT) 20-12.5 MG tablet Take 1 tablet by mouth daily. 08/17/18 08/17/19  Quintella Reichert, MD  carvedilol (COREG) 12.5 MG tablet Take 1 tablet (12.5 mg total) by mouth 2 (two) times daily with a meal. 05/22/19    Turner, Cornelious Bryant, MD  clopidogrel (PLAVIX) 75 MG tablet Take 1 tablet (75 mg total) by mouth daily. 08/10/19   Quintella Reichert, MD  CVS D3 25 MCG (1000 UT) capsule TAKE 1 CAPSULE BY MOUTH EVERY DAY 07/19/18   Plotnikov, Georgina Quint, MD  Dietary Management Product (VASCULERA) TABS Take 1 tablet by mouth daily. 06/17/19   Plotnikov, Georgina Quint, MD  doxycycline (VIBRAMYCIN) 100 MG capsule Take 1 capsule (100 mg total) by mouth 2 (two) times daily. 07/01/19   Hayzen Lorenson, PA-C  FARXIGA 10 MG TABS tablet TAKE 1 TABLET BY MOUTH EVERY  DAY 01/08/19   Plotnikov, Georgina Quint, MD  ferrous sulfate 325 (65 FE) MG tablet TAKE 1 TABLET BY MOUTH EVERY DAY 02/19/19   Plotnikov, Georgina Quint, MD  glucose blood (CONTOUR NEXT TEST) test strip Use to check blood sugars twice day 11/02/18   Plotnikov, Georgina Quint, MD  JANUMET 50-1000 MG tablet TAKE 1 TABLET BY MOUTH 2 (TWO) TIMES DAILY WITH A MEAL. 11/27/18   Plotnikov, Georgina Quint, MD  ketoconazole (NIZORAL) 2 % cream Apply 1 application topically as needed for irritation. 06/17/19   Plotnikov, Georgina Quint, MD  mometasone (ELOCON) 0.1 % cream APPLY TO AFFECTED AREA EVERY DAY 08/11/17   Plotnikov, Georgina Quint, MD  nitroGLYCERIN (NITROSTAT) 0.4 MG SL tablet Place 0.4 mg under the tongue every 5 (five) minutes as needed for chest pain.    [provider]  omeprazole (PRILOSEC) 20 MG capsule TAKE 1 CAPSULE BY MOUTH EVERY DAY 10/18/18   Plotnikov, Georgina Quint, MD  ticagrelor (BRILINTA) 90 MG TABS tablet Take 1 tablet (90 mg total) by mouth 2 (two) times daily. 05/22/19   Quintella Reichert, MD    Allergies    Triamcinolone  Review of Systems   Review of Systems  Constitutional: Negative for chills and fever.  Musculoskeletal: Positive for arthralgias and joint swelling.  Skin: Positive for color change.  Neurological: Negative for weakness and numbness.  All other systems reviewed and are negative.   Physical Exam Updated Vital Signs BP 130/85   Pulse 76   Temp 97.9 F (36.6 C)    Resp 18   SpO2 96%   Physical Exam Vitals and nursing note reviewed.  Constitutional:      Appearance: He is not ill-appearing or diaphoretic.  HENT:     Head: Normocephalic and atraumatic.  Eyes:     Conjunctiva/sclera: Conjunctivae normal.  Cardiovascular:     Rate and Rhythm: Normal rate and regular rhythm.     Pulses: Normal pulses.  Pulmonary:     Effort: Pulmonary effort is normal.     Breath sounds: Normal breath sounds. No wheezing, rhonchi or rales.  Abdominal:     Palpations: Abdomen is soft.     Tenderness: There is no abdominal tenderness.  Musculoskeletal:     Cervical back: Neck supple.     Comments: See photos below. Posterior aspect of R elbow with obvious edema and erythema and TTP. Red streaking noted to medial aspect of right arm from mid upper arm down to the wrist. No obvious increased warmth to the touch. ROM limited, pt unable to fully extend elbow to 180 degree; he is able to extend to about 145 degrees; able to fully flex elbow however endorses pain. No tenderness to forearm, wrist, or shoulder. ROM intact to shoulder and wrist. 2+ radial pulse. Cap refill < 2 seconds.   Skin:    General: Skin is warm and dry.  Neurological:     Mental Status: He is alert.          ED Results / Procedures / Treatments   Labs (all labs ordered are listed, but only abnormal results are displayed) Labs Reviewed  COMPREHENSIVE METABOLIC PANEL - Abnormal; Notable for the following components:      Result Value   Glucose, Bld 134 (*)    All other components within normal limits  CBC WITH DIFFERENTIAL/PLATELET - Abnormal; Notable for the following components:   WBC 10.8 (*)    Neutro Abs 7.9 (*)    Monocytes Absolute 1.4 (*)  All other components within normal limits  CULTURE, BLOOD (ROUTINE X 2)  CULTURE, BLOOD (ROUTINE X 2)  LACTIC ACID, PLASMA    EKG None  Radiology DG Elbow Complete Right  Result Date: 07/01/2019 CLINICAL DATA:  Posterior right elbow  pain and swelling and redness. Limited range of motion. EXAM: RIGHT ELBOW - COMPLETE 3+ VIEW COMPARISON:  None. FINDINGS: There is no fracture or dislocation or joint effusion. There are slight arthritic changes of the elbow joint. Prominent enthesophyte at the triceps insertion. There is nonspecific subcutaneous edema seen posteriorly and medially at the left elbow. IMPRESSION: No acute osseous abnormality. Nonspecific subcutaneous edema. Electronically Signed   By: Francene Boyers M.D.   On: 07/01/2019 10:26    Procedures Procedures (including critical care time)  Medications Ordered in ED Medications - No data to display  ED Course  I have reviewed the triage vital signs and the nursing notes.  Pertinent labs & imaging results that were available during my care of the patient were reviewed by me and considered in my medical decision making (see chart for details).  Clinical Course as of Jul 01 1254  Mon Jul 01, 2019  1130 Discussed case with orthopedist Dr. Dion Saucier who evaluated photos and believes pt may have an infected bursitis; recommends posterior long arm splint, doxycycline x 10 days, and to follow up outpatient on Wednesday   [MV]  [id number] 64 year old male here after feeling like he strained his right elbow 5 days ago and now progressive swelling and new redness throughout the elbow and forearm.  Not particularly warm.  Afebrile.  Painful with range of motion.  Outpatient x-rays not showing any acute fractures.  Getting some screening labs and then will review with orthopedics.   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  Felt a strain and states swelling has gotten worse since then.  Did not notice red streaking until today.  Sent  from [facility] after x-ray showed subcutaneous edema but no other findings.  On arrival to the ED patient is afebrile, nontachycardic nontachypneic.  He appears to be in no acute distress. He has TTP with swelling to the posterior aspect of his right elbow along the olecranon process. Findings suggestive of bursitis however with the red streaking down his forearm will work up for infection today with labs and consult ortho.   CBC with leukocytosis of 10,800. Hgb stable.  CMP with glucose 134. No other electrolyte abnormalities. Creatinine 0.86.  Lactic acid 1.1   Pt placed in posterior long arm splint and sling per ortho reccs. Will see ortho on Wednesday for further eval. Pt discharged home with Rx doxycycline and Tylenol/Ibuprofen PRN for pain. All questions answered at bedside. Pt is in agreement with plan and stable for discharge home.   This note was prepared using Dragon voice recognition software and may include unintentional dictation errors due to the inherent limitations of voice recognition software.  Final Clinical Impression(s) / ED Diagnoses Final diagnoses:  Right elbow pain  Olecranon bursitis of right elbow  Infection of olecranon bursa, right    Rx / DC Orders ED Discharge Orders  Ordered    doxycycline (VIBRAMYCIN) 100 MG capsule  2 times daily     07/01/19 1253           Discharge Instructions     Please pick up antibiotics and take as prescribed for the next 10 days to cover for your infection Call Dr. Luanna Cole office to schedule an appointment for Wednesday Keep splint on until you are seen by ortho. Do not get splint wet.  You can take Ibuprofen and Tylenol as needed for pain       Eustaquio Maize, PA-C 07/01/19 1300    Hayden Rasmussen, MD 07/01/19 [phone]

## 2019-07-04 ENCOUNTER — Other Ambulatory Visit: Payer: Self-pay | Admitting: Internal Medicine

## 2019-07-06 LAB — CULTURE, BLOOD (ROUTINE X 2)
Culture: NO GROWTH
Culture: NO GROWTH
Special Requests: ADEQUATE

## 2019-07-17 ENCOUNTER — Other Ambulatory Visit: Payer: Self-pay | Admitting: Internal Medicine

## 2019-08-08 ENCOUNTER — Telehealth: Payer: Self-pay

## 2019-08-08 NOTE — Telephone Encounter (Signed)
Spoke with the patient - he is to discontinue Brilinta on 08/10/19 and start taking Plavix 75 mg daily. Patient also asking about carvedilol which I advised him to continue taking. Patient verbalized understanding.

## 2019-08-10 ENCOUNTER — Other Ambulatory Visit: Payer: Self-pay | Admitting: Internal Medicine

## 2019-08-10 ENCOUNTER — Other Ambulatory Visit: Payer: Self-pay | Admitting: Cardiology

## 2019-08-12 ENCOUNTER — Other Ambulatory Visit: Payer: Self-pay | Admitting: Cardiology

## 2019-08-13 ENCOUNTER — Other Ambulatory Visit: Payer: Self-pay | Admitting: Cardiology

## 2019-08-15 ENCOUNTER — Other Ambulatory Visit: Payer: Self-pay

## 2019-08-15 MED ORDER — NITROGLYCERIN 0.4 MG SL SUBL: 0.4000 mg | SUBLINGUAL_TABLET | SUBLINGUAL | 6 refills | Status: DC | PRN

## 2019-08-21 ENCOUNTER — Other Ambulatory Visit: Payer: Self-pay

## 2019-08-21 MED ORDER — CARVEDILOL 12.5 MG PO TABS: 12.5000 mg | ORAL_TABLET | Freq: Two times a day (BID) | ORAL | 2 refills | Status: DC

## 2019-09-20 ENCOUNTER — Encounter: Payer: Self-pay | Admitting: Internal Medicine

## 2019-09-29 ENCOUNTER — Other Ambulatory Visit: Payer: Self-pay | Admitting: Internal Medicine

## 2019-10-21 ENCOUNTER — Ambulatory Visit: Payer: BC Managed Care – PPO | Admitting: Internal Medicine

## 2019-10-21 ENCOUNTER — Other Ambulatory Visit: Payer: Self-pay

## 2019-10-21 ENCOUNTER — Encounter: Payer: Self-pay | Admitting: Internal Medicine

## 2019-10-21 DIAGNOSIS — I872 Venous insufficiency (chronic) (peripheral): Secondary | ICD-10-CM

## 2019-10-21 DIAGNOSIS — E559 Vitamin D deficiency, unspecified: Secondary | ICD-10-CM | POA: Diagnosis not present

## 2019-10-21 DIAGNOSIS — Z794 Long term (current) use of insulin: Secondary | ICD-10-CM

## 2019-10-21 DIAGNOSIS — M25562 Pain in left knee: Secondary | ICD-10-CM

## 2019-10-21 DIAGNOSIS — I2510 Atherosclerotic heart disease of native coronary artery without angina pectoris: Secondary | ICD-10-CM

## 2019-10-21 DIAGNOSIS — N181 Chronic kidney disease, stage 1: Secondary | ICD-10-CM

## 2019-10-21 DIAGNOSIS — Z6841 Body Mass Index (BMI) 40.0 and over, adult: Secondary | ICD-10-CM

## 2019-10-21 DIAGNOSIS — E1122 Type 2 diabetes mellitus with diabetic chronic kidney disease: Secondary | ICD-10-CM | POA: Diagnosis not present

## 2019-10-21 NOTE — Progress Notes (Signed)
Subjective:  Patient ID: Andrew Carson, male    DOB: 23-Feb-1956  Age: 64 y.o. MRN: [id number]  CC: No chief complaint on file.   HPI Manuelito Poage Heisler presents for DM, HTN, CAD, obesity f/u  Outpatient Medications Prior to Visit  Medication Sig Dispense Refill  . atorvastatin (LIPITOR) 80 MG tablet TAKE 1 TABLET BY MOUTH EVERY DAY 90 tablet 3  . Bayer Microlet Lancets lancets Use to help check blood sugar twice a day 100 each 12  . carvedilol (COREG) 12.5 MG tablet Take 1 tablet (12.5 mg total) by mouth 2 (two) times daily with a meal. 180 tablet 2  . clopidogrel (PLAVIX) 75 MG tablet Take 1 tablet (75 mg total) by mouth daily. 90 tablet 3  . [facility] D3 25 MCG (1000 UT) capsule TAKE 1 CAPSULE BY MOUTH EVERY DAY 90 capsule 3  . FARXIGA 10 MG TABS tablet TAKE 1 TABLET BY MOUTH EVERY DAY 90 tablet 3  . ferrous sulfate 325 (65 FE) MG tablet TAKE 1 TABLET BY MOUTH EVERY DAY 90 tablet 1  . glucose blood (CONTOUR NEXT TEST) test strip Use to check blood sugars twice day 100 each 12  . JANUMET 50-1000 MG tablet TAKE 1 TABLET BY MOUTH 2 (TWO) TIMES DAILY WITH A MEAL. 180 tablet 3  . ketoconazole (NIZORAL) 2 % cream Apply 1 application topically as needed for irritation. 120 g 3  . mometasone (ELOCON) 0.1 % cream APPLY TO AFFECTED AREA EVERY DAY 45 g 3  . nitroGLYCERIN (NITROSTAT) 0.4 MG SL tablet Place 1 tablet (0.4 mg total) under the tongue every 5 (five) minutes as needed for chest pain. 25 tablet 6  . omeprazole (PRILOSEC) 20 MG capsule TAKE 1 CAPSULE BY MOUTH EVERY DAY 90 capsule 3  . aspirin EC 81 MG tablet Take 1 tablet (81 mg total) by mouth daily. 100 tablet 3  . benazepril-hydrochlorthiazide (LOTENSIN HCT) 20-12.5 MG tablet Take 1 tablet by mouth daily. 90 tablet 3  . Dietary Management Product (VASCULERA) TABS Take 1 tablet by mouth daily. (Patient not taking: Reported on 10/21/2019) 90 tablet 3  . doxycycline (VIBRAMYCIN) 100 MG capsule Take 1 capsule (100 mg total) by mouth 2 (two) times  daily. 20 capsule 0   Facility-Administered Medications Prior to Visit  Medication Dose Route Frequency Provider Last Rate Last Admin  . 0.9 %  sodium chloride infusion  500 mL Intravenous Continuous Iva Boop, MD        ROS: Review of Systems  Constitutional: Negative for appetite change, fatigue and unexpected weight change.  HENT: Negative for congestion, nosebleeds, sneezing, sore throat and trouble swallowing.   Eyes: Negative for itching and visual disturbance.  Respiratory: Negative for cough.   Cardiovascular: Negative for chest pain, palpitations and leg swelling.  Gastrointestinal: Negative for abdominal distention, blood in stool, diarrhea and nausea.  Genitourinary: Negative for frequency and hematuria.  Musculoskeletal: Negative for back pain, gait problem, joint swelling and neck pain.  Skin: Positive for color change. Negative for rash.  Neurological: Negative for dizziness, tremors, speech difficulty and weakness.  Psychiatric/Behavioral: Negative for agitation, dysphoric mood and sleep disturbance. The patient is not nervous/anxious.     Objective:  BP 112/70 (BP Location: Left Arm, Patient Position: Sitting, Cuff Size: Large)   Pulse 80   Temp 98.6 F (37 C) (Oral)   Ht 5\' 10"  (1.778 m)   Wt 275 lb (124.7 kg)   SpO2 98%   BMI 39.46 kg/m  BP Readings from Last 3 Encounters:  10/21/19 112/70  07/01/19 121/84  06/17/19 132/82    Wt Readings from Last 3 Encounters:  10/21/19 275 lb (124.7 kg)  06/17/19 263 lb (119.3 kg)  05/28/19 265 lb 9.6 oz (120.5 kg)    Physical Exam Constitutional:      General: He is not in acute distress.    Appearance: He is well-developed. He is obese.     Comments: NAD  Eyes:     Conjunctiva/sclera: Conjunctivae normal.     Pupils: Pupils are equal, round, and reactive to light.  Neck:     Thyroid: No thyromegaly.     Vascular: No JVD.  Cardiovascular:     Rate and Rhythm: Normal rate and regular rhythm.      Heart sounds: Normal heart sounds. No murmur heard.  No friction rub. No gallop.   Pulmonary:     Effort: Pulmonary effort is normal. No respiratory distress.     Breath sounds: Normal breath sounds. No wheezing or rales.  Chest:     Chest wall: No tenderness.  Abdominal:     General: Bowel sounds are normal. There is no distension.     Palpations: Abdomen is soft. There is no mass.     Tenderness: There is no abdominal tenderness. There is no guarding or rebound.  Musculoskeletal:        General: No tenderness. Normal range of motion.     Cervical back: Normal range of motion.  Lymphadenopathy:     Cervical: No cervical adenopathy.  Skin:    General: Skin is warm and dry.     Findings: No rash.  Neurological:     Mental Status: He is alert and oriented to person, place, and time.     Cranial Nerves: No cranial nerve deficit.     Motor: No abnormal muscle tone.     Coordination: Coordination normal.     Gait: Gait normal.     Deep Tendon Reflexes: Reflexes are normal and symmetric.  Psychiatric:        Behavior: Behavior normal.        Thought Content: Thought content normal.        Judgment: Judgment normal.    R ankle w/discoloration   Lab Results  Component Value Date   WBC 10.8 (H) 07/01/2019   HGB 14.6 07/01/2019   HCT 44.7 07/01/2019   PLT 272 07/01/2019   GLUCOSE 134 (H) 07/01/2019   CHOL 93 09/10/2018   TRIG 132 09/10/2018   HDL 30 (L) 09/10/2018   LDLDIRECT 149.9 03/12/2007   LDLCALC 41 09/10/2018   ALT 33 07/01/2019   AST 23 07/01/2019   NA 138 07/01/2019   K 3.7 07/01/2019   CL 105 07/01/2019   CREATININE 0.86 07/01/2019   BUN 19 07/01/2019   CO2 24 07/01/2019   TSH 1.77 12/20/2016   PSA 0.7 12/20/2016   HGBA1C 6.7 11/11/2017   MICROALBUR 0.2 12/20/2016    DG Elbow Complete Right  Result Date: 07/01/2019 CLINICAL DATA:  Posterior right elbow pain and swelling and redness. Limited range of motion. EXAM: RIGHT ELBOW - COMPLETE 3+ VIEW  COMPARISON:  None. FINDINGS: There is no fracture or dislocation or joint effusion. There are slight arthritic changes of the elbow joint. Prominent enthesophyte at the triceps insertion. There is nonspecific subcutaneous edema seen posteriorly and medially at the left elbow. IMPRESSION: No acute osseous abnormality. Nonspecific subcutaneous edema. Electronically Signed   By: Francene Boyers M.D.  On: 07/01/2019 10:26    Assessment & Plan:   Diagnoses and all orders for this visit:  Coronary artery disease involving native coronary artery of native heart without angina pectoris  Type 2 diabetes mellitus with stage 1 chronic kidney disease, with long-term current use of insulin (HCC)       Follow-up: No follow-ups on file.  Sonda Primes, MD

## 2019-10-21 NOTE — Assessment & Plan Note (Signed)
No CP 

## 2019-10-21 NOTE — Assessment & Plan Note (Signed)
L>R:  Tylenol prn

## 2019-10-21 NOTE — Assessment & Plan Note (Signed)
Wt Readings from Last 3 Encounters:  10/21/19 275 lb (124.7 kg)  06/17/19 263 lb (119.3 kg)  05/28/19 265 lb 9.6 oz (120.5 kg)

## 2019-10-21 NOTE — Assessment & Plan Note (Signed)
Recent A1c 5.8% Andrew Carson

## 2019-10-21 NOTE — Assessment & Plan Note (Signed)
Mometasone cream

## 2019-10-21 NOTE — Assessment & Plan Note (Signed)
Vit D 

## 2019-10-26 ENCOUNTER — Other Ambulatory Visit: Payer: Self-pay | Admitting: Cardiology

## 2019-12-12 ENCOUNTER — Telehealth: Payer: Self-pay | Admitting: Cardiology

## 2019-12-12 NOTE — Telephone Encounter (Signed)
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 He said Dr. 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 Turner got it and if she needs to see him first just let him know.

## 2019-12-18 NOTE — Telephone Encounter (Signed)
Patient is following up regarding the status of DOT renewal paperwork.

## 2019-12-20 NOTE — Telephone Encounter (Signed)
Spoke with the patient and advised him that I have not seen the form. He is going to fax the form over now. Advised him that I will have Dr. Mayford Knife sign it and let him know when it is ready for him to pick up.

## 2019-12-24 ENCOUNTER — Other Ambulatory Visit: Payer: Self-pay | Admitting: Internal Medicine

## 2019-12-26 NOTE — Telephone Encounter (Signed)
Left message for patient - form is ready for him to be picked up at the office.

## 2020-01-10 ENCOUNTER — Ambulatory Visit: Payer: Self-pay

## 2020-01-10 ENCOUNTER — Other Ambulatory Visit: Payer: Self-pay | Admitting: Occupational Medicine

## 2020-01-10 ENCOUNTER — Other Ambulatory Visit: Payer: Self-pay

## 2020-01-10 DIAGNOSIS — M79632 Pain in left forearm: Secondary | ICD-10-CM

## 2020-01-10 DIAGNOSIS — M25522 Pain in left elbow: Secondary | ICD-10-CM

## 2020-01-31 ENCOUNTER — Other Ambulatory Visit: Payer: Self-pay | Admitting: Internal Medicine

## 2020-02-11 ENCOUNTER — Ambulatory Visit (INDEPENDENT_AMBULATORY_CARE_PROVIDER_SITE_OTHER): Payer: BC Managed Care – PPO | Admitting: Internal Medicine

## 2020-02-11 ENCOUNTER — Other Ambulatory Visit: Payer: Self-pay

## 2020-02-11 ENCOUNTER — Encounter: Payer: Self-pay | Admitting: Internal Medicine

## 2020-02-11 DIAGNOSIS — E1122 Type 2 diabetes mellitus with diabetic chronic kidney disease: Secondary | ICD-10-CM

## 2020-02-11 DIAGNOSIS — N181 Chronic kidney disease, stage 1: Secondary | ICD-10-CM

## 2020-02-11 DIAGNOSIS — E785 Hyperlipidemia, unspecified: Secondary | ICD-10-CM

## 2020-02-11 DIAGNOSIS — Z794 Long term (current) use of insulin: Secondary | ICD-10-CM | POA: Diagnosis not present

## 2020-02-11 NOTE — Progress Notes (Signed)
Subjective:  Patient ID: Andrew Carson, male    DOB: [birth year]  Age: 64 y.o. MRN: [id number]  CC: Follow-up   HPI Andrew Carson presents for DM, HTN, CAD f/u On abx from Dr Annabell Howells   Advised to get a COVID shot JJ ASAP  Outpatient Medications Prior to Visit  Medication Sig Dispense Refill  . atorvastatin (LIPITOR) 80 MG tablet TAKE 1 TABLET BY MOUTH EVERY DAY 90 tablet 3  . Bayer Microlet Lancets lancets Use to help check blood sugar twice a day 100 each 12  . benazepril-hydrochlorthiazide (LOTENSIN HCT) 20-12.5 MG tablet TAKE 1 TABLET BY MOUTH EVERY DAY 90 tablet 3  . carvedilol (COREG) 12.5 MG tablet Take 1 tablet (12.5 mg total) by mouth 2 (two) times daily with a meal. 180 tablet 2  . clopidogrel (PLAVIX) 75 MG tablet Take 1 tablet (75 mg total) by mouth daily. 90 tablet 3  . [facility]S D3 25 MCG (1000 UT) capsule TAKE 1 CAPSULE BY MOUTH EVERY DAY 90 capsule 3  . Dietary Management Product (VASCULERA) TABS Take 1 tablet by mouth daily. 90 tablet 3  . FARXIGA 10 MG TABS tablet TAKE 1 TABLET BY MOUTH EVERY DAY 90 tablet 3  . ferrous sulfate 325 (65 FE) MG tablet TAKE 1 TABLET BY MOUTH EVERY DAY 90 tablet 1  . glucose blood (CONTOUR NEXT TEST) test strip Use to check blood sugars twice day 100 each 12  . JANUMET 50-1000 MG tablet TAKE 1 TABLET BY MOUTH 2 (TWO) TIMES DAILY WITH A MEAL. 180 tablet 3  . ketoconazole (NIZORAL) 2 % cream Apply 1 application topically as needed for irritation. 120 g 3  . mometasone (ELOCON) 0.1 % cream APPLY TO AFFECTED AREA EVERY DAY 45 g 3  . nitroGLYCERIN (NITROSTAT) 0.4 MG SL tablet Place 1 tablet (0.4 mg total) under the tongue every 5 (five) minutes as needed for chest pain. 25 tablet 6  . omeprazole (PRILOSEC) 20 MG capsule TAKE 1 CAPSULE BY MOUTH EVERY DAY 90 capsule 3  . aspirin EC 81 MG tablet Take 1 tablet (81 mg total) by mouth daily. 100 tablet 3  . cephALEXin (KEFLEX) 500 MG capsule Take 500 mg by mouth 3 (three) times daily.    . tamsulosin  (FLOMAX) 0.4 MG CAPS capsule Take 0.4 mg by mouth daily.     Facility-Administered Medications Prior to Visit  Medication Dose Route Frequency Provider Last Rate Last Admin  . 0.9 %  sodium chloride infusion  500 mL Intravenous Continuous Iva Boop, MD        ROS: Review of Systems  Constitutional: Negative for appetite change, fatigue and unexpected weight change.  HENT: Negative for congestion, nosebleeds, sneezing, sore throat and trouble swallowing.   Eyes: Negative for itching and visual disturbance.  Respiratory: Negative for cough.   Cardiovascular: Negative for chest pain, palpitations and leg swelling.  Gastrointestinal: Negative for abdominal distention, blood in stool, diarrhea and nausea.  Genitourinary: Negative for frequency and hematuria.  Musculoskeletal: Positive for back pain. Negative for gait problem, joint swelling and neck pain.  Skin: Negative for rash.  Neurological: Negative for dizziness, tremors, speech difficulty and weakness.  Psychiatric/Behavioral: Negative for agitation, dysphoric mood and sleep disturbance. The patient is not nervous/anxious.     Objective:  BP 120/80 (BP Location: Left Arm, Patient Position: Sitting, Cuff Size: Large)   Pulse 83   Temp 98 F (36.7 C) (Oral)   Ht 5\' 10"  (1.778 m)   Wt  278 lb 3.2 oz (126.2 kg)   SpO2 95%   BMI 39.92 kg/m   BP Readings from Last 3 Encounters:  02/11/20 120/80  10/21/19 112/70  07/01/19 121/84    Wt Readings from Last 3 Encounters:  02/11/20 278 lb 3.2 oz (126.2 kg)  10/21/19 275 lb (124.7 kg)  06/17/19 263 lb (119.3 kg)    Physical Exam Constitutional:      General: He is not in acute distress.    Appearance: He is well-developed. He is obese.     Comments: NAD  Eyes:     Conjunctiva/sclera: Conjunctivae normal.     Pupils: Pupils are equal, round, and reactive to light.  Neck:     Thyroid: No thyromegaly.     Vascular: No JVD.  Cardiovascular:     Rate and Rhythm: Normal  rate and regular rhythm.     Heart sounds: Normal heart sounds. No murmur heard.  No friction rub. No gallop.   Pulmonary:     Effort: Pulmonary effort is normal. No respiratory distress.     Breath sounds: Normal breath sounds. No wheezing or rales.  Chest:     Chest wall: No tenderness.  Abdominal:     General: Bowel sounds are normal. There is no distension.     Palpations: Abdomen is soft. There is no mass.     Tenderness: There is no abdominal tenderness. There is no guarding or rebound.  Musculoskeletal:        General: No tenderness. Normal range of motion.     Cervical back: Normal range of motion.  Lymphadenopathy:     Cervical: No cervical adenopathy.  Skin:    General: Skin is warm and dry.     Findings: No rash.  Neurological:     Mental Status: He is alert and oriented to person, place, and time.     Cranial Nerves: No cranial nerve deficit.     Motor: No abnormal muscle tone.     Coordination: Coordination normal.     Gait: Gait normal.     Deep Tendon Reflexes: Reflexes are normal and symmetric.  Psychiatric:        Behavior: Behavior normal.        Thought Content: Thought content normal.        Judgment: Judgment normal.     Lab Results  Component Value Date   WBC 10.8 (H) 07/01/2019   HGB 14.6 07/01/2019   HCT 44.7 07/01/2019   PLT 272 07/01/2019   GLUCOSE 134 (H) 07/01/2019   CHOL 93 09/10/2018   TRIG 132 09/10/2018   HDL 30 (L) 09/10/2018   LDLDIRECT 149.9 03/12/2007   LDLCALC 41 09/10/2018   ALT 33 07/01/2019   AST 23 07/01/2019   NA 138 07/01/2019   K 3.7 07/01/2019   CL 105 07/01/2019   CREATININE 0.86 07/01/2019   BUN 19 07/01/2019   CO2 24 07/01/2019   TSH 1.77 12/20/2016   PSA 0.7 12/20/2016   HGBA1C 6.7 11/11/2017   MICROALBUR 0.2 12/20/2016    DG Elbow Complete Right  Result Date: 07/01/2019 CLINICAL DATA:  Posterior right elbow pain and swelling and redness. Limited range of motion. EXAM: RIGHT ELBOW - COMPLETE 3+ VIEW  COMPARISON:  None. FINDINGS: There is no fracture or dislocation or joint effusion. There are slight arthritic changes of the elbow joint. Prominent enthesophyte at the triceps insertion. There is nonspecific subcutaneous edema seen posteriorly and medially at the left elbow. IMPRESSION: No acute osseous  abnormality. Nonspecific subcutaneous edema. Electronically Signed   By: Francene Boyers M.D.   On: 07/01/2019 10:26    Assessment & Plan:    Sonda Primes, MD

## 2020-02-11 NOTE — Assessment & Plan Note (Signed)
Lipitor 

## 2020-02-11 NOTE — Assessment & Plan Note (Addendum)
Recent A1c 6.0%, glu 124  Linagl-metformin

## 2020-02-11 NOTE — Progress Notes (Signed)
Subjective:  Patient ID: Andrew Carson, male    DOB: 1955/05/27  Age: 64 y.o. MRN: [id number]  CC: Follow-up   HPI WYLAN GENTZLER presents for   Outpatient Medications Prior to Visit  Medication Sig Dispense Refill  . atorvastatin (LIPITOR) 80 MG tablet TAKE 1 TABLET BY MOUTH EVERY DAY 90 tablet 3  . Bayer Microlet Lancets lancets Use to help check blood sugar twice a day 100 each 12  . benazepril-hydrochlorthiazide (LOTENSIN HCT) 20-12.5 MG tablet TAKE 1 TABLET BY MOUTH EVERY DAY 90 tablet 3  . carvedilol (COREG) 12.5 MG tablet Take 1 tablet (12.5 mg total) by mouth 2 (two) times daily with a meal. 180 tablet 2  . clopidogrel (PLAVIX) 75 MG tablet Take 1 tablet (75 mg total) by mouth daily. 90 tablet 3  . CVS D3 25 MCG (1000 UT) capsule TAKE 1 CAPSULE BY MOUTH EVERY DAY 90 capsule 3  . Dietary Management Product (VASCULERA) TABS Take 1 tablet by mouth daily. 90 tablet 3  . FARXIGA 10 MG TABS tablet TAKE 1 TABLET BY MOUTH EVERY DAY 90 tablet 3  . ferrous sulfate 325 (65 FE) MG tablet TAKE 1 TABLET BY MOUTH EVERY DAY 90 tablet 1  . glucose blood (CONTOUR NEXT TEST) test strip Use to check blood sugars twice day 100 each 12  . JANUMET 50-1000 MG tablet TAKE 1 TABLET BY MOUTH 2 (TWO) TIMES DAILY WITH A MEAL. 180 tablet 3  . ketoconazole (NIZORAL) 2 % cream Apply 1 application topically as needed for irritation. 120 g 3  . mometasone (ELOCON) 0.1 % cream APPLY TO AFFECTED AREA EVERY DAY 45 g 3  . nitroGLYCERIN (NITROSTAT) 0.4 MG SL tablet Place 1 tablet (0.4 mg total) under the tongue every 5 (five) minutes as needed for chest pain. 25 tablet 6  . omeprazole (PRILOSEC) 20 MG capsule TAKE 1 CAPSULE BY MOUTH EVERY DAY 90 capsule 3  . aspirin EC 81 MG tablet Take 1 tablet (81 mg total) by mouth daily. 100 tablet 3  . cephALEXin (KEFLEX) 500 MG capsule Take 500 mg by mouth 3 (three) times daily.    . tamsulosin (FLOMAX) 0.4 MG CAPS capsule Take 0.4 mg by mouth daily.      Facility-Administered Medications Prior to Visit  Medication Dose Route Frequency Provider Last Rate Last Admin  . 0.9 %  sodium chloride infusion  500 mL Intravenous Continuous Iva Boop, MD        ROS: Review of Systems  Constitutional: Negative for appetite change, fatigue and unexpected weight change.  HENT: Negative for congestion, nosebleeds, sneezing, sore throat and trouble swallowing.   Eyes: Negative for itching and visual disturbance.  Respiratory: Negative for cough.   Cardiovascular: Negative for chest pain, palpitations and leg swelling.  Gastrointestinal: Negative for abdominal distention, blood in stool, diarrhea and nausea.  Genitourinary: Negative for frequency and hematuria.  Musculoskeletal: Negative for back pain, gait problem, joint swelling and neck pain.  Skin: Negative for rash.  Neurological: Negative for dizziness, tremors, speech difficulty and weakness.  Psychiatric/Behavioral: Negative for agitation, dysphoric mood and sleep disturbance. The patient is not nervous/anxious.     Objective:  BP 120/80 (BP Location: Left Arm, Patient Position: Sitting, Cuff Size: Large)   Pulse 83   Temp 98 F (36.7 C) (Oral)   Ht 5\' 10"  (1.778 m)   Wt 278 lb 3.2 oz (126.2 kg)   SpO2 95%   BMI 39.92 kg/m   BP Readings from  Last 3 Encounters:  02/11/20 120/80  10/21/19 112/70  07/01/19 121/84    Wt Readings from Last 3 Encounters:  02/11/20 278 lb 3.2 oz (126.2 kg)  10/21/19 275 lb (124.7 kg)  06/17/19 263 lb (119.3 kg)    Physical Exam Constitutional:      General: He is not in acute distress.    Appearance: He is well-developed.     Comments: NAD  Eyes:     Conjunctiva/sclera: Conjunctivae normal.     Pupils: Pupils are equal, round, and reactive to light.  Neck:     Thyroid: No thyromegaly.     Vascular: No JVD.  Cardiovascular:     Rate and Rhythm: Normal rate and regular rhythm.     Heart sounds: Normal heart sounds. No murmur heard.  No  friction rub. No gallop.   Pulmonary:     Effort: Pulmonary effort is normal. No respiratory distress.     Breath sounds: Normal breath sounds. No wheezing or rales.  Chest:     Chest wall: No tenderness.  Abdominal:     General: Bowel sounds are normal. There is no distension.     Palpations: Abdomen is soft. There is no mass.     Tenderness: There is no abdominal tenderness. There is no guarding or rebound.  Musculoskeletal:        General: No tenderness. Normal range of motion.     Cervical back: Normal range of motion.  Lymphadenopathy:     Cervical: No cervical adenopathy.  Skin:    General: Skin is warm and dry.     Findings: No rash.  Neurological:     Mental Status: He is alert and oriented to person, place, and time.     Cranial Nerves: No cranial nerve deficit.     Motor: No abnormal muscle tone.     Coordination: Coordination normal.     Gait: Gait normal.     Deep Tendon Reflexes: Reflexes are normal and symmetric.  Psychiatric:        Behavior: Behavior normal.        Thought Content: Thought content normal.        Judgment: Judgment normal.     Lab Results  Component Value Date   WBC 10.8 (H) 07/01/2019   HGB 14.6 07/01/2019   HCT 44.7 07/01/2019   PLT 272 07/01/2019   GLUCOSE 134 (H) 07/01/2019   CHOL 93 09/10/2018   TRIG 132 09/10/2018   HDL 30 (L) 09/10/2018   LDLDIRECT 149.9 03/12/2007   LDLCALC 41 09/10/2018   ALT 33 07/01/2019   AST 23 07/01/2019   NA 138 07/01/2019   K 3.7 07/01/2019   CL 105 07/01/2019   CREATININE 0.86 07/01/2019   BUN 19 07/01/2019   CO2 24 07/01/2019   TSH 1.77 12/20/2016   PSA 0.7 12/20/2016   HGBA1C 6.7 11/11/2017   MICROALBUR 0.2 12/20/2016    DG Elbow Complete Right  Result Date: 07/01/2019 CLINICAL DATA:  Posterior right elbow pain and swelling and redness. Limited range of motion. EXAM: RIGHT ELBOW - COMPLETE 3+ VIEW COMPARISON:  None. FINDINGS: There is no fracture or dislocation or joint effusion. There are  slight arthritic changes of the elbow joint. Prominent enthesophyte at the triceps insertion. There is nonspecific subcutaneous edema seen posteriorly and medially at the left elbow. IMPRESSION: No acute osseous abnormality. Nonspecific subcutaneous edema. Electronically Signed   By: Francene Boyers M.D.   On: 07/01/2019 10:26    Assessment &  Plan:     Follow-up: Return in about 4 months (around 06/10/2020) for a follow-up visit.  Sonda Primes, MD

## 2020-03-29 ENCOUNTER — Other Ambulatory Visit: Payer: Self-pay | Admitting: Internal Medicine

## 2020-05-08 ENCOUNTER — Other Ambulatory Visit: Payer: Self-pay | Admitting: Cardiology

## 2020-05-22 ENCOUNTER — Ambulatory Visit: Payer: BC Managed Care – PPO | Admitting: Cardiology

## 2020-06-05 ENCOUNTER — Encounter: Payer: Self-pay | Admitting: Cardiology

## 2020-06-05 ENCOUNTER — Other Ambulatory Visit: Payer: Self-pay | Admitting: Cardiology

## 2020-06-05 ENCOUNTER — Other Ambulatory Visit: Payer: Self-pay

## 2020-06-05 ENCOUNTER — Ambulatory Visit (INDEPENDENT_AMBULATORY_CARE_PROVIDER_SITE_OTHER): Payer: BC Managed Care – PPO | Admitting: Cardiology

## 2020-06-05 VITALS — BP 128/87 | HR 80 | Ht 70.0 in | Wt 296.6 lb

## 2020-06-05 DIAGNOSIS — E785 Hyperlipidemia, unspecified: Secondary | ICD-10-CM | POA: Diagnosis not present

## 2020-06-05 DIAGNOSIS — I10 Essential (primary) hypertension: Secondary | ICD-10-CM | POA: Diagnosis not present

## 2020-06-05 DIAGNOSIS — I2510 Atherosclerotic heart disease of native coronary artery without angina pectoris: Secondary | ICD-10-CM | POA: Diagnosis not present

## 2020-06-05 DIAGNOSIS — E119 Type 2 diabetes mellitus without complications: Secondary | ICD-10-CM | POA: Diagnosis not present

## 2020-06-05 NOTE — Progress Notes (Signed)
Cardiology Office Note:    Date:  06/05/2020   ID:  Andrew Carson, DOB [birth year], MRN [id number]  PCP:  Andrew Garter, MD  Cardiologist:  Andrew Magic, MD    Referring MD: Andrew Garter, MD   Chief Complaint  Patient presents with  . Coronary Artery Disease  . Hypertension  . Hyperlipidemia    History of Present Illness:    Andrew Carson is a 65 y.o. male with a hx of  HTN and Hyperlipidemia. 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 The pain was very severe and lasted 20 minutes. He stopped and called an ambulance and was given NGT and ASA. In ER in [place] he ruled in for MI with elevated troponin. He was transferred to [facility] and underwent cath showing a 90% LAD s/p PCI with residual dz with  70% distal LCx and distal RCA lesions. Since then he has done well with no further CP.   He is here today for followup and is doing well.  He denies any chest pain or pressure, SOB, DOE, PND, orthopnea, LE edema (except if he is driving all day), dizziness, palpitations or syncope. He is compliant with his meds and is tolerating meds with no SE.    Past Medical History:  Diagnosis Date  . ABNORMAL GLUCOSE NEC 08/24/2007   Qualifier: Diagnosis of  By: Plotnikov MD, Georgina Quint   . Anemia 08/11/2017   Colon  2017 Dr Leone Payor Start iron 2019 GI ref if Hgb not better  . Coronary artery disease 08/07/2018   April 2020  [name] developed chest pain or shortness of breath in the [employer] truck while going through [place] on July 29, 2018, Sunday.  His partner was driving.  They stopped and called the ambulance.  The chest pain was severe and lasted for about 20 minutes.  He was given nitroglycerin and aspirin.  They took him to [facility] in [place], [place] on April 20 where he was found t  . Diabetes mellitus 2011   type 2  . Dyslipidemia (high LDL; low HDL) 10/15/2010   Chronic  Cardiac CT scoring info given 11/19  . Elevated PSA 09/06/2013   5/15 due to UTI Dr Annabell Howells   . Essential hypertension 02/23/2007   Chronic Lotensin HCT, Coreg  Cardiac CT scoring test offered 11/19  . Finger pain, right 02/11/2011   10/12 R 4th prox phal - likely a partial flexor tendon rupture   . GERD 03/12/2007    On Omeprazole  . GERD (gastroesophageal reflux disease)   . Hypertension   . Hypogonadism male   . KNEE PAIN 03/12/2007   11/19 L   . Myocardial infarction [facility]) 08/07/2018   July 29, 2018  [name] developed chest pain or shortness of breath in the [employer] truck while going through [place] on July 29, 2018, Sunday.  His partner was driving.  They stopped and called the ambulance.  The chest pain was severe and lasted for about 20 minutes.  He was given nitroglycerin and aspirin.  They took him to [facility] in [place], [place] on April 20 where he was fou  . Obesity   . Osteoarthritis   . OSTEOARTHRITIS 03/12/2007   Chronic     . Phimosis 01/29/2016   10/17 candida due to [place] - tolerable  . UPPER RESPIRATORY INFECTION, ACUTE 06/11/2010   Qualifier: Diagnosis of  By: Plotnikov MD,  Martyn Ehrich 06/05/2009   Qualifier: Diagnosis of  By: Plotnikov MD, Georgina Quint   . UTI (urinary tract infection) 09/06/2013   5/15 dr Annabell Howells   . Venous insufficiency 08/19/2011   B LE mild R>L   . Vitamin D deficiency 03/14/2007   Chronic       Past Surgical History:  Procedure Laterality Date  . LUMBAR LAMINECTOMY  07/1995   L5-S1  . WISDOM TOOTH EXTRACTION      Current Medications: Current Meds  Medication Sig  . atorvastatin (LIPITOR) 80 MG tablet TAKE 1 TABLET BY MOUTH EVERY DAY  . Bayer Microlet Lancets lancets Use to help check blood sugar twice a day  . benazepril-hydrochlorthiazide (LOTENSIN HCT) 20-12.5 MG tablet TAKE 1 TABLET BY MOUTH EVERY DAY  . carvedilol (COREG) 12.5 MG tablet TAKE 1 TABLET BY MOUTH TWICE A DAY WITH MEALS  . clopidogrel (PLAVIX) 75 MG  tablet TAKE 1 TABLET BY MOUTH EVERY DAY  . [employer] D3 25 MCG (1000 UT) capsule TAKE 1 CAPSULE BY MOUTH EVERY DAY  . FARXIGA 10 MG TABS tablet TAKE 1 TABLET BY MOUTH EVERY DAY  . ferrous sulfate 325 (65 FE) MG tablet TAKE 1 TABLET BY MOUTH EVERY DAY  . glucose blood (CONTOUR NEXT TEST) test strip Use to check blood sugars twice day  . JANUMET 50-1000 MG tablet TAKE 1 TABLET BY MOUTH 2 (TWO) TIMES DAILY WITH A MEAL.  Marland Kitchen ketoconazole (NIZORAL) 2 % cream Apply 1 application topically as needed for irritation.  . mometasone (ELOCON) 0.1 % cream APPLY TO AFFECTED AREA EVERY DAY  . nitroGLYCERIN (NITROSTAT) 0.4 MG SL tablet Place 1 tablet (0.4 mg total) under the tongue every 5 (five) minutes as needed for chest pain.  Marland Kitchen omeprazole (PRILOSEC) 20 MG capsule TAKE 1 CAPSULE BY MOUTH EVERY DAY  . tamsulosin (FLOMAX) 0.4 MG CAPS capsule Take 0.4 mg by mouth daily.   Current Facility-Administered Medications for the 06/05/20 encounter (Office Visit) with Andrew Reichert, MD  Medication  . 0.9 %  sodium chloride infusion     Allergies:   Triamcinolone   Social History   Socioeconomic History  . Marital status: Married    Spouse name: Not on file  . Number of children: Not on file  . Years of education: Not on file  . Highest education level: Not on file  Occupational History  . Not on file  Tobacco Use  . Smoking status: Never Smoker  . Smokeless tobacco: Never Used  Substance and Sexual Activity  . Alcohol use: No  . Drug use: No  . Sexual activity: Yes  Other Topics Concern  . Not on file  Social History Narrative   Truck driver   Married 6 kids   Social Determinants of Health   Financial Resource Strain: Not on file  Food Insecurity: Not on file  Transportation Needs: Not on file  Physical Activity: Not on file  Stress: Not on file  Social Connections: Not on file     Family History: The patient's family history includes COPD in his mother; Heart disease (age of onset: 32) in his  father; Heart disease (age of onset: 45) in his mother; Kidney disease in an other family member. There is no history of Colon cancer, Esophageal cancer, Rectal cancer, or Stomach cancer.  ROS:   Please see the history of present illness.    ROS  All other systems reviewed and negative.   EKGs/Labs/Other Studies Reviewed:  The following studies were reviewed today: Outside labs from [facility]  EKG:  EKG is  ordered today.  The ekg ordered today demonstrates NSR with no ST changes  Recent Labs: 07/01/2019: ALT 33; BUN 19; Creatinine, Ser 0.86; Hemoglobin 14.6; Platelets 272; Potassium 3.7; Sodium 138   Recent Lipid Panel    Component Value Date/Time   CHOL 93 09/10/2018 1146   TRIG 132 09/10/2018 1146   HDL 30 (L) 09/10/2018 1146   CHOLHDL 3.1 09/10/2018 1146   VLDL 30 09/18/2015 0817   LDLCALC 41 09/10/2018 1146   LDLDIRECT 149.9 03/12/2007 0834    Physical Exam:    VS:  BP (!) 154/92   Pulse 89   Ht 5\' 10"  (1.778 m)   Wt 296 lb 9.6 oz (134.5 kg)   SpO2 98%   BMI 42.56 kg/m     Wt Readings from Last 3 Encounters:  06/05/20 296 lb 9.6 oz (134.5 kg)  02/11/20 278 lb 3.2 oz (126.2 kg)  10/21/19 275 lb (124.7 kg)     GEN: Well nourished, well developed in no acute distress HEENT: Normal NECK: No JVD; No carotid bruits LYMPHATICS: No lymphadenopathy CARDIAC:RRR, no murmurs, rubs, gallops RESPIRATORY:  Clear to auscultation without rales, wheezing or rhonchi  ABDOMEN: Soft, non-tender, non-distended MUSCULOSKELETAL:  No edema; No deformity  SKIN: Warm and dry NEUROLOGIC:  Alert and oriented x 3 PSYCHIATRIC:  Normal affect   ASSESSMENT:    1. Coronary artery disease involving native coronary artery of native heart without angina pectoris   2. Essential hypertension   3. Dyslipidemia (high LDL; low HDL)   4. DM type 2, goal 12/22/19 < 7% (HCC)   5. Morbid obesity (HCC)    PLAN:    In order of problems listed above:  1.  ASCAD -s/p NSTEMI in KDX8P with cath  showing 90% LAD and 70% distal LCx and distal RCA lesions.  S/P PCI of the LAD 07/2018. -he has not had any further anginal symptoms since I saw him last -continue ASA 81mg  daily, Plavix 75mg  daily, statin, Carvedilol 12.5mg  BID   2.  HTN -BP is borderline controlled on exam today>>he just took his meds before he came in -I have asked him to check his BP daily for a week and call with results -continue Benazepril-HCT 20-12.5mg  daily, Carvedilol 12.5mg  BID -check BMET today  3.  HLD -LDL goal < 70 -LDL was on outside labs was 41 in June 2020 -continue atorvastatin 80mg  daily -check FLP and ALT  4.  Type 2 DM -followed by PCP -HbA1C from outside labs was 6.7% in 2019 -continue Janumet 50-1000mg  BID and Farxiga 10mg  daily -continue statin and ACE I  5.  Morbid Obesity -I have encouraged him to get into a routine exercise program and cut back on carbs and portions.    Medication Adjustments/Labs and Tests Ordered: Current medicines are reviewed at length with the patient today.  Concerns regarding medicines are outlined above.  Orders Placed This Encounter  Procedures  . EKG 12-Lead   No orders of the defined types were placed in this encounter.   Signed, , MD  06/05/2020 8:35 AM    [facility]

## 2020-06-05 NOTE — Patient Instructions (Signed)
Medication Instructions:  Your physician recommends that you continue on your current medications as directed. Please refer to the Current Medication list given to you today.  *If you need a refill on your cardiac medications before your next appointment, please call your pharmacy*   Lab Work: CMET and fasting lipids to be done at [facility] per patient request If you have labs (blood work) drawn today and your tests are completely normal, you will receive your results only by: Marland Kitchen MyChart Message (if you have MyChart) OR . A paper copy in the mail If you have any lab test that is abnormal or we need to change your treatment, we will call you to review the results.   Testing/Procedures: none   Follow-Up: At [facility], you and your health needs are our priority.  As part of our continuing mission to provide you with exceptional heart care, we have created designated Provider Care Teams.  These Care Teams include your primary Cardiologist (physician) and Advanced Practice Providers (APPs -  Physician Assistants and Nurse Practitioners) who all work together to provide you with the care you need, when you need it.  Your next appointment:   1 year(s)  The format for your next appointment:   In Person  Provider:   Armanda Magic, MD

## 2020-06-06 LAB — LIPID PANEL
Cholesterol: 100 mg/dL (ref <200)
HDL: 35 mg/dL — ABNORMAL LOW (ref >=40)
LDL Cholesterol (Calc): 44 mg/dL (calc)
Non-HDL Cholesterol (Calc): 65 mg/dL (calc) (ref <130)
Total CHOL/HDL Ratio: 2.9 (calc) (ref <5.0)
Triglycerides: 126 mg/dL (ref <150)

## 2020-06-06 LAB — COMPLETE METABOLIC PANEL WITH GFR
AG Ratio: 1.7 (calc) (ref 1.0–2.5)
ALT: 29 U/L (ref 9–46)
AST: 18 U/L (ref 10–35)
Albumin: 4 g/dL (ref 3.6–5.1)
Alkaline phosphatase (APISO): 56 U/L (ref 35–144)
BUN: 14 mg/dL (ref 7–25)
CO2: 25 mmol/L (ref 20–32)
Calcium: 9.4 mg/dL (ref 8.6–10.3)
Chloride: 108 mmol/L (ref 98–110)
Creat: 0.89 mg/dL (ref 0.70–1.25)
GFR, Est African American: 105 mL/min/{1.73_m2} (ref >=60)
GFR, Est Non African American: 90 mL/min/{1.73_m2} (ref >=60)
Globulin: 2.3 g/dL (calc) (ref 1.9–3.7)
Glucose, Bld: 116 mg/dL — ABNORMAL HIGH (ref 65–99)
Potassium: 3.8 mmol/L (ref 3.5–5.3)
Sodium: 143 mmol/L (ref 135–146)
Total Bilirubin: 0.9 mg/dL (ref 0.2–1.2)
Total Protein: 6.3 g/dL (ref 6.1–8.1)

## 2020-07-04 ENCOUNTER — Other Ambulatory Visit: Payer: Self-pay | Admitting: Internal Medicine

## 2020-07-17 ENCOUNTER — Other Ambulatory Visit: Payer: Self-pay | Admitting: Internal Medicine

## 2020-07-17 LAB — BASIC METABOLIC PANEL
Glucose: 126
Sodium: 141 (ref 137–147)

## 2020-07-17 LAB — PSA: PSA: 0.75

## 2020-07-18 LAB — LIPID PANEL
Cholesterol: 113 mg/dL (ref <200)
HDL: 35 mg/dL — ABNORMAL LOW (ref >=40)
LDL Cholesterol (Calc): 59 mg/dL (calc)
Non-HDL Cholesterol (Calc): 78 mg/dL (calc) (ref <130)
Total CHOL/HDL Ratio: 3.2 (calc) (ref <5.0)
Triglycerides: 109 mg/dL (ref <150)

## 2020-07-18 LAB — COMPLETE METABOLIC PANEL WITH GFR
AG Ratio: 1.9 (calc) (ref 1.0–2.5)
ALT: 20 U/L (ref 9–46)
AST: 15 U/L (ref 10–35)
Albumin: 4.1 g/dL (ref 3.6–5.1)
Alkaline phosphatase (APISO): 54 U/L (ref 35–144)
BUN: 20 mg/dL (ref 7–25)
CO2: 27 mmol/L (ref 20–32)
Calcium: 9.4 mg/dL (ref 8.6–10.3)
Chloride: 104 mmol/L (ref 98–110)
Creat: 0.94 mg/dL (ref 0.70–1.25)
GFR, Est African American: 99 mL/min/{1.73_m2} (ref >=60)
GFR, Est Non African American: 85 mL/min/{1.73_m2} (ref >=60)
Globulin: 2.2 g/dL (calc) (ref 1.9–3.7)
Glucose, Bld: 126 mg/dL — ABNORMAL HIGH (ref 65–99)
Potassium: 3.8 mmol/L (ref 3.5–5.3)
Sodium: 141 mmol/L (ref 135–146)
Total Bilirubin: 0.6 mg/dL (ref 0.2–1.2)
Total Protein: 6.3 g/dL (ref 6.1–8.1)

## 2020-07-18 LAB — URINALYSIS, COMPLETE
Bacteria, UA: NONE SEEN /HPF
Bilirubin Urine: NEGATIVE
Hgb urine dipstick: NEGATIVE
Hyaline Cast: NONE SEEN /LPF
Ketones, ur: NEGATIVE
Leukocytes,Ua: NEGATIVE
Nitrite: NEGATIVE
Protein, ur: NEGATIVE
RBC / HPF: NONE SEEN /HPF (ref 0–2)
Specific Gravity, Urine: 1.028 (ref 1.001–1.03)
Squamous Epithelial / HPF: NONE SEEN /HPF (ref <=5)
WBC, UA: NONE SEEN /HPF (ref 0–5)
pH: 6 (ref 5.0–8.0)

## 2020-07-18 LAB — CBC WITH DIFFERENTIAL/PLATELET
Absolute Monocytes: 932 cells/uL (ref 200–950)
Basophils Absolute: 50 cells/uL (ref 0–200)
Basophils Relative: 0.6 %
Eosinophils Absolute: 193 cells/uL (ref 15–500)
Eosinophils Relative: 2.3 %
HCT: 44.9 % (ref 38.5–50.0)
Hemoglobin: 15 g/dL (ref 13.2–17.1)
Lymphs Abs: 1663 cells/uL (ref 850–3900)
MCH: 29 pg (ref 27.0–33.0)
MCHC: 33.4 g/dL (ref 32.0–36.0)
MCV: 86.8 fL (ref 80.0–100.0)
MPV: 9.3 fL (ref 7.5–12.5)
Monocytes Relative: 11.1 %
Neutro Abs: 5561 cells/uL (ref 1500–7800)
Neutrophils Relative %: 66.2 %
Platelets: 204 10*3/uL (ref 140–400)
RBC: 5.17 10*6/uL (ref 4.20–5.80)
RDW: 13.3 % (ref 11.0–15.0)
Total Lymphocyte: 19.8 %
WBC: 8.4 10*3/uL (ref 3.8–10.8)

## 2020-07-18 LAB — MICROALBUMIN / CREATININE URINE RATIO
Creatinine, Urine: 67 mg/dL (ref 20–320)
Microalb Creat Ratio: 3 mcg/mg creat (ref <30)
Microalb, Ur: 0.2 mg/dL

## 2020-07-18 LAB — PSA: PSA: 0.75 ng/mL (ref <=4.0)

## 2020-07-18 LAB — HEMOGLOBIN A1C W/OUT EAG: Hgb A1c MFr Bld: 6.5 % of total Hgb — ABNORMAL HIGH (ref <5.7)

## 2020-07-20 ENCOUNTER — Encounter: Payer: Self-pay | Admitting: Internal Medicine

## 2020-07-21 ENCOUNTER — Encounter: Payer: Self-pay | Admitting: Internal Medicine

## 2020-07-21 ENCOUNTER — Other Ambulatory Visit: Payer: Self-pay

## 2020-07-21 ENCOUNTER — Ambulatory Visit: Payer: BC Managed Care – PPO | Admitting: Internal Medicine

## 2020-07-21 DIAGNOSIS — E1122 Type 2 diabetes mellitus with diabetic chronic kidney disease: Secondary | ICD-10-CM | POA: Diagnosis not present

## 2020-07-21 DIAGNOSIS — R972 Elevated prostate specific antigen [PSA]: Secondary | ICD-10-CM

## 2020-07-21 DIAGNOSIS — M65342 Trigger finger, left ring finger: Secondary | ICD-10-CM | POA: Diagnosis not present

## 2020-07-21 DIAGNOSIS — I2510 Atherosclerotic heart disease of native coronary artery without angina pectoris: Secondary | ICD-10-CM

## 2020-07-21 DIAGNOSIS — M6530 Trigger finger, unspecified finger: Secondary | ICD-10-CM | POA: Insufficient documentation

## 2020-07-21 DIAGNOSIS — N181 Chronic kidney disease, stage 1: Secondary | ICD-10-CM

## 2020-07-21 DIAGNOSIS — E559 Vitamin D deficiency, unspecified: Secondary | ICD-10-CM

## 2020-07-21 DIAGNOSIS — Z794 Long term (current) use of insulin: Secondary | ICD-10-CM

## 2020-07-21 DIAGNOSIS — I10 Essential (primary) hypertension: Secondary | ICD-10-CM

## 2020-07-21 MED ORDER — DICLOFENAC SODIUM 1 % EX GEL: 2.0000 g | Freq: Four times a day (QID) | CUTANEOUS | 3 refills | Status: AC

## 2020-07-21 NOTE — Patient Instructions (Signed)
Trigger Finger  Trigger finger, also called stenosing tenosynovitis,  is a condition that causes a finger to get stuck in a bent position. Each finger has a tendon, which is a tough, cord-like tissue that connects muscle to bone, and each tendon passes through a tunnel of tissue called a tendon sheath. To move your finger, your tendon needs to glide freely through the sheath. Trigger finger happens when the tendon or the sheath thickens, making it difficult to move your finger. Trigger finger can affect any finger or a thumb. It may affect more than one finger. Mild cases may clear up with rest and medicine. Severe cases require more treatment. What are the causes? Trigger finger is caused by a thickened finger tendon or tendon sheath. The cause of this thickening is not known. What increases the risk? The following factors may make you more likely to develop this condition:  Doing activities that require a strong grip.  Having rheumatoid arthritis, gout, or diabetes.  Being 40-60 years old.  Being male. What are the signs or symptoms? Symptoms of this condition include:  Pain when bending or straightening your finger.  Tenderness or swelling where your finger attaches to the palm of your hand.  A lump in the palm of your hand or on the inside of your finger.  Hearing a noise like a pop or a snap when you try to straighten your finger.  Feeling a catching or locking sensation when you try to straighten your finger.  Being unable to straighten your finger. How is this diagnosed? This condition is diagnosed based on your symptoms and a physical exam. How is this treated? This condition may be treated by:  Resting your finger and avoiding activities that make symptoms worse.  Wearing a finger splint to keep your finger extended.  Taking NSAIDs, such as ibuprofen, to relieve pain and swelling.  Doing gentle exercises to stretch the finger as told by your health care  provider.  Having medicine that reduces swelling and inflammation (steroids) injected into the tendon sheath. Injections may need to be repeated.  Having surgery to open the tendon sheath. This may be done if other treatments do not work and you cannot straighten your finger. You may need physical therapy after surgery. Follow these instructions at home: If you have a splint:  Wear the splint as told by your health care provider. Remove it only as told by your health care provider.  Loosen it if your fingers tingle, become numb, or turn cold and blue.  Keep it clean.  If the splint is not waterproof: ? Do not let it get wet. ? Cover it with a watertight covering when you take a bath or shower. Managing pain, stiffness, and swelling If directed, apply heat to the affected area as often as told by your health care provider. Use the heat source that your health care provider recommends, such as a moist heat pack or a heating pad.  Place a towel between your skin and the heat source.  Leave the heat on for 20-30 minutes.  Remove the heat if your skin turns bright red. This is especially important if you are unable to feel pain, heat, or cold. You may have a greater risk of getting burned. If directed, put ice on the painful area. To do this:  If you have a removable splint, remove it as told by your health care provider.  Put ice in a plastic bag.  Place a towel between your skin   and the bag or between your splint and the bag.  Leave the ice on for 20 minutes, 2-3 times a day.      Activity  Rest your finger as told by your health care provider. Avoid activities that make the pain worse.  Return to your normal activities as told by your health care provider. Ask your health care provider what activities are safe for you.  Do exercises as told by your health care provider.  Ask your health care provider when it is safe to drive if you have a splint on your hand. General  instructions  Take over-the-counter and prescription medicines only as told by your health care provider.  Keep all follow-up visits as told by your health care provider. This is important. Contact a health care provider if:  Your symptoms are not improving with home care. Summary  Trigger finger, also called stenosing tenosynovitis, causes your finger to get stuck in a bent position. This can make it difficult and painful to straighten your finger.  This condition develops when a finger tendon or tendon sheath thickens.  Treatment may include resting your finger, wearing a splint, and taking medicines.  In severe cases, surgery to open the tendon sheath may be needed. This information is not intended to replace advice given to you by your health care provider. Make sure you discuss any questions you have with your health care provider. Document Revised: 08/13/2018 Document Reviewed: 08/13/2018 Elsevier Patient Education  2021 Elsevier Inc.  

## 2020-07-21 NOTE — Assessment & Plan Note (Signed)
Linagl-metformin

## 2020-07-21 NOTE — Assessment & Plan Note (Signed)
L 4th trigger finger Voltaren gel  Steroid shot was offered Dr Dion Saucier

## 2020-07-21 NOTE — Progress Notes (Signed)
Subjective:  Patient ID: Andrew Carson, male    DOB: 18-Jan-1956  Age: 65 y.o. MRN: [id number]  CC: Follow-up (4 month f/u)   HPI Andrew Carson presents for DM, HTN, CAD C/o L 4th trigger finger  Outpatient Medications Prior to Visit  Medication Sig Dispense Refill  . atorvastatin (LIPITOR) 80 MG tablet TAKE 1 TABLET BY MOUTH EVERY DAY 90 tablet 3  . Bayer Microlet Lancets lancets Use to help check blood sugar twice a day 100 each 12  . benazepril-hydrochlorthiazide (LOTENSIN HCT) 20-12.5 MG tablet TAKE 1 TABLET BY MOUTH EVERY DAY 90 tablet 3  . carvedilol (COREG) 12.5 MG tablet TAKE 1 TABLET BY MOUTH TWICE A DAY WITH MEALS 180 tablet 0  . clopidogrel (PLAVIX) 75 MG tablet TAKE 1 TABLET BY MOUTH EVERY DAY 90 tablet 0  . CVS D3 25 MCG (1000 UT) capsule TAKE 1 CAPSULE BY MOUTH EVERY DAY 90 capsule 3  . FARXIGA 10 MG TABS tablet TAKE 1 TABLET BY MOUTH EVERY DAY 90 tablet 3  . ferrous sulfate 325 (65 FE) MG tablet TAKE 1 TABLET BY MOUTH EVERY DAY 90 tablet 1  . glucose blood (CONTOUR NEXT TEST) test strip Use to check blood sugars twice day 100 each 12  . JANUMET 50-1000 MG tablet TAKE 1 TABLET BY MOUTH 2 (TWO) TIMES DAILY WITH A MEAL. 180 tablet 3  . ketoconazole (NIZORAL) 2 % cream Apply 1 application topically as needed for irritation. 120 g 3  . mometasone (ELOCON) 0.1 % cream APPLY TO AFFECTED AREA EVERY DAY 45 g 3  . nitroGLYCERIN (NITROSTAT) 0.4 MG SL tablet Place 1 tablet (0.4 mg total) under the tongue every 5 (five) minutes as needed for chest pain. 25 tablet 6  . omeprazole (PRILOSEC) 20 MG capsule TAKE 1 CAPSULE BY MOUTH EVERY DAY 90 capsule 3  . tamsulosin (FLOMAX) 0.4 MG CAPS capsule Take 0.4 mg by mouth daily.    Marland Kitchen aspirin EC 81 MG tablet Take 1 tablet (81 mg total) by mouth daily. 100 tablet 3  . 0.9 %  sodium chloride infusion      No facility-administered medications prior to visit.    ROS: Review of Systems  Constitutional: Negative for appetite change, fatigue  and unexpected weight change.  HENT: Negative for congestion, nosebleeds, sneezing, sore throat and trouble swallowing.   Eyes: Negative for itching and visual disturbance.  Respiratory: Negative for cough.   Cardiovascular: Negative for chest pain, palpitations and leg swelling.  Gastrointestinal: Negative for abdominal distention, blood in stool, diarrhea and nausea.  Genitourinary: Negative for frequency and hematuria.  Musculoskeletal: Positive for arthralgias. Negative for back pain, gait problem, joint swelling and neck pain.  Skin: Negative for rash.  Neurological: Negative for dizziness, tremors, speech difficulty and weakness.  Psychiatric/Behavioral: Negative for agitation, dysphoric mood and sleep disturbance. The patient is not nervous/anxious.     Objective:  BP 128/80 (BP Location: Left Arm)   Pulse 73   Temp 98 F (36.7 C) (Oral)   Ht 5\' 10"  (1.778 m)   Wt 298 lb (135.2 kg)   SpO2 98%   BMI 42.76 kg/m   BP Readings from Last 3 Encounters:  07/21/20 128/80  06/05/20 128/87  02/11/20 120/80    Wt Readings from Last 3 Encounters:  07/21/20 298 lb (135.2 kg)  06/05/20 296 lb 9.6 oz (134.5 kg)  02/11/20 278 lb 3.2 oz (126.2 kg)    Physical Exam Constitutional:      General:  He is not in acute distress.    Appearance: He is well-developed. He is obese.     Comments: NAD  Eyes:     Conjunctiva/sclera: Conjunctivae normal.     Pupils: Pupils are equal, round, and reactive to light.  Neck:     Thyroid: No thyromegaly.     Vascular: No JVD.  Cardiovascular:     Rate and Rhythm: Normal rate and regular rhythm.     Heart sounds: Normal heart sounds. No murmur heard. No friction rub. No gallop.   Pulmonary:     Effort: Pulmonary effort is normal. No respiratory distress.     Breath sounds: Normal breath sounds. No wheezing or rales.  Chest:     Chest wall: No tenderness.  Abdominal:     General: Bowel sounds are normal. There is no distension.      Palpations: Abdomen is soft. There is no mass.     Tenderness: There is no abdominal tenderness. There is no guarding or rebound.  Musculoskeletal:        General: Tenderness present. Normal range of motion.     Cervical back: Normal range of motion.  Lymphadenopathy:     Cervical: No cervical adenopathy.  Skin:    General: Skin is warm and dry.     Findings: No rash.  Neurological:     Mental Status: He is alert and oriented to person, place, and time.     Cranial Nerves: No cranial nerve deficit.     Motor: No abnormal muscle tone.     Coordination: Coordination normal.     Gait: Gait normal.     Deep Tendon Reflexes: Reflexes are normal and symmetric.  Psychiatric:        Behavior: Behavior normal.        Thought Content: Thought content normal.        Judgment: Judgment normal.   L 4th trigger finger Trace edema B  Lab Results  Component Value Date   WBC 8.4 07/17/2020   HGB 15.0 07/17/2020   HCT 44.9 07/17/2020   PLT 204 07/17/2020   GLUCOSE 126 (H) 07/17/2020   CHOL 113 07/17/2020   TRIG 109 07/17/2020   HDL 35 (L) 07/17/2020   LDLDIRECT 149.9 03/12/2007   LDLCALC 59 07/17/2020   ALT 20 07/17/2020   AST 15 07/17/2020   NA 141 07/17/2020   NA 141 07/17/2020   K 3.8 07/17/2020   CL 104 07/17/2020   CREATININE 0.94 07/17/2020   BUN 20 07/17/2020   CO2 27 07/17/2020   TSH 1.77 12/20/2016   PSA 0.75 07/17/2020   PSA 0.75 07/17/2020   HGBA1C 6.5 (H) 07/17/2020   MICROALBUR 0.2 07/17/2020    DG Elbow Complete Right  Result Date: 07/01/2019 CLINICAL DATA:  Posterior right elbow pain and swelling and redness. Limited range of motion. EXAM: RIGHT ELBOW - COMPLETE 3+ VIEW COMPARISON:  None. FINDINGS: There is no fracture or dislocation or joint effusion. There are slight arthritic changes of the elbow joint. Prominent enthesophyte at the triceps insertion. There is nonspecific subcutaneous edema seen posteriorly and medially at the left elbow. IMPRESSION: No acute  osseous abnormality. Nonspecific subcutaneous edema. Electronically Signed   By: Francene Boyers M.D.   On: 07/01/2019 10:26    Assessment & Plan:   There are no diagnoses linked to this encounter.   No orders of the defined types were placed in this encounter.    Follow-up: No follow-ups on file.  Alex Keasha Malkiewicz,  MD

## 2020-07-21 NOTE — Assessment & Plan Note (Signed)
Normal PSA

## 2020-07-21 NOTE — Assessment & Plan Note (Signed)
Lotensin HCT, Coreg

## 2020-07-21 NOTE — Assessment & Plan Note (Signed)
On Vit D 

## 2020-07-21 NOTE — Assessment & Plan Note (Signed)
On Lipitor 

## 2020-07-26 ENCOUNTER — Other Ambulatory Visit: Payer: Self-pay | Admitting: Internal Medicine

## 2020-08-01 ENCOUNTER — Other Ambulatory Visit: Payer: Self-pay | Admitting: Cardiology

## 2020-08-02 ENCOUNTER — Other Ambulatory Visit: Payer: Self-pay | Admitting: Cardiology

## 2020-08-03 ENCOUNTER — Other Ambulatory Visit: Payer: Self-pay

## 2020-08-08 ENCOUNTER — Other Ambulatory Visit: Payer: Self-pay | Admitting: Cardiology

## 2020-09-20 ENCOUNTER — Other Ambulatory Visit: Payer: Self-pay | Admitting: Internal Medicine

## 2020-10-16 LAB — HEPATIC FUNCTION PANEL
ALT: 27 (ref 10–40)
AST: 20 (ref 14–40)
Alkaline Phosphatase: 52 (ref 25–125)
Bilirubin, Total: 0.8

## 2020-10-16 LAB — BASIC METABOLIC PANEL
BUN: 15 (ref 4–21)
CO2: 26 — AB (ref 13–22)
Chloride: 106 (ref 99–108)
Creatinine: 0.9 (ref 0.6–1.3)
Glucose: 142
Potassium: 3.6 (ref 3.4–5.3)
Sodium: 141 (ref 137–147)

## 2020-10-16 LAB — HEMOGLOBIN A1C: Hemoglobin A1C: 6.7

## 2020-10-16 LAB — COMPREHENSIVE METABOLIC PANEL
GFR calc Af Amer: 100
GFR calc non Af Amer: 86

## 2020-10-22 ENCOUNTER — Ambulatory Visit: Payer: BC Managed Care – PPO | Admitting: Internal Medicine

## 2020-10-22 ENCOUNTER — Other Ambulatory Visit: Payer: Self-pay

## 2020-10-22 ENCOUNTER — Encounter: Payer: Self-pay | Admitting: Internal Medicine

## 2020-10-22 DIAGNOSIS — Z794 Long term (current) use of insulin: Secondary | ICD-10-CM

## 2020-10-22 DIAGNOSIS — E559 Vitamin D deficiency, unspecified: Secondary | ICD-10-CM | POA: Diagnosis not present

## 2020-10-22 DIAGNOSIS — I872 Venous insufficiency (chronic) (peripheral): Secondary | ICD-10-CM

## 2020-10-22 DIAGNOSIS — M25561 Pain in right knee: Secondary | ICD-10-CM

## 2020-10-22 DIAGNOSIS — I2510 Atherosclerotic heart disease of native coronary artery without angina pectoris: Secondary | ICD-10-CM | POA: Diagnosis not present

## 2020-10-22 DIAGNOSIS — E1122 Type 2 diabetes mellitus with diabetic chronic kidney disease: Secondary | ICD-10-CM | POA: Diagnosis not present

## 2020-10-22 DIAGNOSIS — N181 Chronic kidney disease, stage 1: Secondary | ICD-10-CM

## 2020-10-22 MED ORDER — LIDOCAINE-EPINEPHRINE 2 %-1:100000 IJ SOLN
3.0000 mL | Freq: Once | INTRAMUSCULAR | Status: AC
  Administered 2020-10-22: 3 mL

## 2020-10-22 MED ORDER — CELECOXIB 200 MG PO CAPS: 200.0000 mg | ORAL_CAPSULE | Freq: Every day | ORAL | 1 refills | Status: DC | PRN

## 2020-10-22 MED ORDER — NITROGLYCERIN 0.4 MG SL SUBL: 0.4000 mg | SUBLINGUAL_TABLET | SUBLINGUAL | 6 refills | Status: DC | PRN

## 2020-10-22 MED ORDER — METHYLPREDNISOLONE ACETATE 40 MG/ML IJ SUSP
40.0000 mg | Freq: Once | INTRAMUSCULAR | Status: AC
  Administered 2020-10-22: 40 mg via INTRAMUSCULAR

## 2020-10-22 NOTE — Progress Notes (Signed)
Subjective:  Patient ID: Andrew Carson, male    DOB: 1955-12-23  Age: 65 y.o. MRN: [id number]  CC: No chief complaint on file.   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 100 tablet 3   atorvastatin (LIPITOR) 80 MG tablet TAKE 1 TABLET BY MOUTH EVERY DAY 90 tablet 3   Bayer Microlet Lancets lancets Use to help check blood sugar twice a day 100 each 12   benazepril-hydrochlorthiazide (LOTENSIN HCT) 20-12.5 MG tablet TAKE 1 TABLET BY MOUTH EVERY DAY 90 tablet 2   carvedilol (COREG) 12.5 MG tablet TAKE 1 TABLET BY MOUTH TWICE A DAY WITH MEALS 180 tablet 2   clopidogrel (PLAVIX) 75 MG tablet TAKE 1 TABLET BY MOUTH EVERY DAY 90 tablet 2   [employer]S D3 25 MCG (1000 UT) capsule TAKE 1 CAPSULE BY MOUTH EVERY DAY 90 capsule 3   diclofenac Sodium (VOLTAREN) 1 % GEL Apply 2 g topically 4 (four) times daily. 100 g 3   FARXIGA 10 MG TABS tablet TAKE 1 TABLET BY MOUTH EVERY DAY 90 tablet 3   ferrous sulfate 325 (65 FE) MG tablet TAKE 1 TABLET BY MOUTH EVERY DAY 90 tablet 1   glucose blood (CONTOUR NEXT TEST) test strip Use to check blood sugars twice day 100 each 12   JANUMET 50-1000 MG tablet TAKE 1 TABLET BY MOUTH 2 (TWO) TIMES DAILY WITH A MEAL. 180 tablet 3   ketoconazole (NIZORAL) 2 % cream Apply 1 application topically as needed for irritation. 120 g 3   mometasone (ELOCON) 0.1 % cream APPLY TO AFFECTED AREA EVERY DAY 45 g 3   nitroGLYCERIN (NITROSTAT) 0.4 MG SL tablet Place 1 tablet (0.4 mg total) under the tongue every 5 (five) minutes as needed for chest pain. 25 tablet 6   omeprazole (PRILOSEC) 20 MG capsule TAKE 1 CAPSULE BY MOUTH EVERY DAY 90 capsule 3   tamsulosin (FLOMAX) 0.4 MG CAPS capsule Take 0.4 mg by mouth daily.     No facility-administered  medications prior to visit.    ROS: Review of Systems  Constitutional:  Positive for unexpected weight change. Negative for appetite change and fatigue.  HENT:  Negative for congestion, nosebleeds, sneezing, sore throat and trouble swallowing.   Eyes:  Negative for itching and visual disturbance.  Respiratory:  Negative for cough.   Cardiovascular:  Negative for chest pain, palpitations and leg swelling.  Gastrointestinal:  Negative for abdominal distention, blood in stool, diarrhea and nausea.  Genitourinary:  Negative for frequency and hematuria.  Musculoskeletal:  Positive for arthralgias and gait problem. Negative for back pain, joint swelling and neck pain.  Skin:  Negative for rash.  Neurological:  Negative for dizziness, tremors, speech difficulty and weakness.  Psychiatric/Behavioral:  Negative for agitation, dysphoric mood and sleep disturbance. The patient is not nervous/anxious.    Objective:  There were no vitals taken for this visit.  BP Readings from Last 3 Encounters:  07/21/20 128/80  06/05/20 128/87  02/11/20 120/80    Wt Readings from Last 3 Encounters:  07/21/20 298 lb (135.2 kg)  06/05/20 296 lb 9.6 oz (134.5 kg)  02/11/20 278 lb 3.2 oz (126.2 kg)    Physical Exam Constitutional:  General: He is not in acute distress.    Appearance: He is well-developed. He is obese.     Comments: NAD  Eyes:     Conjunctiva/sclera: Conjunctivae normal.     Pupils: Pupils are equal, round, and reactive to light.  Neck:     Thyroid: No thyromegaly.     Vascular: No JVD.  Cardiovascular:     Rate and Rhythm: Normal rate and regular rhythm.     Heart sounds: Normal heart sounds. No murmur heard.   No friction rub. No gallop.  Pulmonary:     Effort: Pulmonary effort is normal. No respiratory distress.     Breath sounds: Normal breath sounds. No wheezing or rales.  Chest:     Chest wall: No tenderness.  Abdominal:     General: Bowel sounds are normal. There is no  distension.     Palpations: Abdomen is soft. There is no mass.     Tenderness: There is no abdominal tenderness. There is no guarding or rebound.  Musculoskeletal:        General: Tenderness present. Normal range of motion.     Cervical back: Normal range of motion.  Lymphadenopathy:     Cervical: No cervical adenopathy.  Skin:    General: Skin is warm and dry.     Findings: No rash.  Neurological:     Mental Status: He is alert and oriented to person, place, and time.     Cranial Nerves: No cranial nerve deficit.     Motor: No abnormal muscle tone.     Coordination: Coordination normal.     Gait: Gait abnormal.     Deep Tendon Reflexes: Reflexes are normal and symmetric.  Psychiatric:        Behavior: Behavior normal.        Thought Content: Thought content normal.        Judgment: Judgment normal.   R knee w/pain   Procedure Note :     Procedure : Joint Injection, R  knee   Indication:  Joint osteoarthritis with refractory  chronic pain.   Risks including unsuccessful procedure , bleeding, infection, bruising, skin atrophy, "steroid flare-up" and others were explained to the patient in detail as well as the benefits. Informed consent was obtained verbally.  Tthe patient was placed in a comfortable position. Lateral approach was used. Skin was prepped with Betadine and alcohol  and anesthetized a cooling spray. Then, a 5 cc syringe with a 1.5 inch long 25-gauge needle was used for a joint injection.. The needle was advanced  Into the knee joint cavity. I aspirated a small amount of intra-articular fluid to confirm correct placement of the needle and injected the joint with 5 mL of 2% lidocaine and 40 mg of Depo-Medrol .  Band-Aid was applied.   Tolerated well. Complications: None. Good pain relief following the procedure.   Postprocedure instructions :    A Band-Aid should be left on for 12 hours. Injection therapy is not a cure itself. It is used in conjunction with other  modalities. You can use nonsteroidal anti-inflammatories like ibuprofen , hot and cold compresses. Rest is recommended in the next 24 hours. You need to report immediately  if fever, chills or any signs of infection develop.  Lab Results  Component Value Date   WBC 8.4 07/17/2020   HGB 15.0 07/17/2020   HCT 44.9 07/17/2020   PLT 204 07/17/2020   GLUCOSE 126 (H) 07/17/2020   CHOL 113 07/17/2020  TRIG 109 07/17/2020   HDL 35 (L) 07/17/2020   LDLDIRECT 149.9 03/12/2007   LDLCALC 59 07/17/2020   ALT 20 07/17/2020   AST 15 07/17/2020   NA 141 07/17/2020   NA 141 07/17/2020   K 3.8 07/17/2020   CL 104 07/17/2020   CREATININE 0.94 07/17/2020   BUN 20 07/17/2020   CO2 27 07/17/2020   TSH 1.77 12/20/2016   PSA 0.75 07/17/2020   PSA 0.75 07/17/2020   HGBA1C 6.5 (H) 07/17/2020   MICROALBUR 0.2 07/17/2020    DG Elbow Complete Right  Result Date: 07/01/2019 CLINICAL DATA:  Posterior right elbow pain and swelling and redness. Limited range of motion. EXAM: RIGHT ELBOW - COMPLETE 3+ VIEW COMPARISON:  None. FINDINGS: There is no fracture or dislocation or joint effusion. There are slight arthritic changes of the elbow joint. Prominent enthesophyte at the triceps insertion. There is nonspecific subcutaneous edema seen posteriorly and medially at the left elbow. IMPRESSION: No acute osseous abnormality. Nonspecific subcutaneous edema. Electronically Signed   By: Francene Boyers M.D.   On: 07/01/2019 10:26    Assessment & Plan:    Sonda Primes, MD

## 2020-10-22 NOTE — Assessment & Plan Note (Signed)
[employer] info

## 2020-10-22 NOTE — Patient Instructions (Addendum)
TENS unit    Postprocedure instructions :    A Band-Aid should be left on for 12 hours. Injection therapy is not a cure itself. It is used in conjunction with other modalities. You can use nonsteroidal anti-inflammatories like ibuprofen , hot and cold compresses. Rest is recommended in the next 24 hours. You need to report immediately  if fever, chills or any signs of infection develop.

## 2020-10-22 NOTE — Assessment & Plan Note (Signed)
Compr socks, wt loss

## 2020-10-22 NOTE — Assessment & Plan Note (Signed)
On Vit D 

## 2020-10-22 NOTE — Assessment & Plan Note (Signed)
R knee. TENS unit Will inject

## 2020-10-22 NOTE — Assessment & Plan Note (Signed)
Cont w/Plavix, ASA, Coreg, Lipitor NTG prn Treat DM,HTN

## 2020-12-07 ENCOUNTER — Other Ambulatory Visit: Payer: Self-pay | Admitting: Internal Medicine

## 2021-01-19 ENCOUNTER — Other Ambulatory Visit: Payer: Self-pay | Admitting: Internal Medicine

## 2021-02-09 ENCOUNTER — Encounter: Payer: Self-pay | Admitting: Internal Medicine

## 2021-02-09 ENCOUNTER — Ambulatory Visit (INDEPENDENT_AMBULATORY_CARE_PROVIDER_SITE_OTHER): Payer: BC Managed Care – PPO | Admitting: Internal Medicine

## 2021-02-09 ENCOUNTER — Other Ambulatory Visit: Payer: Self-pay

## 2021-02-09 DIAGNOSIS — N181 Chronic kidney disease, stage 1: Secondary | ICD-10-CM

## 2021-02-09 DIAGNOSIS — Z6841 Body Mass Index (BMI) 40.0 and over, adult: Secondary | ICD-10-CM

## 2021-02-09 DIAGNOSIS — Z794 Long term (current) use of insulin: Secondary | ICD-10-CM

## 2021-02-09 DIAGNOSIS — M25561 Pain in right knee: Secondary | ICD-10-CM

## 2021-02-09 DIAGNOSIS — E1122 Type 2 diabetes mellitus with diabetic chronic kidney disease: Secondary | ICD-10-CM

## 2021-02-09 DIAGNOSIS — E66813 Obesity, class 3: Secondary | ICD-10-CM

## 2021-02-09 MED ORDER — TIRZEPATIDE 2.5 MG/0.5ML ~~LOC~~ SOAJ: 2.5000 mg | SUBCUTANEOUS | 3 refills | Status: DC

## 2021-02-09 NOTE — Progress Notes (Signed)
[facility].  There is no  Subjective:  Patient ID: Andrew Carson, male    DOB: 1955-08-27  Age: 65 y.o. MRN: [id number]  CC: Follow-up (4 month f/u)   HPI Andrew Carson presents for R knee pain, swelling - he saw ortho, had a shot in the knee - he had a shot F/u DM, CAD  A1c 6.9%  Outpatient Medications Prior to Visit  Medication Sig Dispense Refill   atorvastatin (LIPITOR) 80 MG tablet TAKE 1 TABLET BY MOUTH EVERY DAY 90 tablet 3   Bayer Microlet Lancets lancets Use to help check blood sugar twice a day 100 each 12   benazepril-hydrochlorthiazide (LOTENSIN HCT) 20-12.5 MG tablet TAKE 1 TABLET BY MOUTH EVERY DAY 90 tablet 2   carvedilol (COREG) 12.5 MG tablet TAKE 1 TABLET BY MOUTH TWICE A DAY WITH MEALS 180 tablet 2   celecoxib (CELEBREX) 200 MG capsule TAKE 1 CAPSULE (200 MG TOTAL) BY MOUTH DAILY AS NEEDED FOR MODERATE PAIN. 90 capsule 0   clopidogrel (PLAVIX) 75 MG tablet TAKE 1 TABLET BY MOUTH EVERY DAY 90 tablet 2   [employer]S D3 25 MCG (1000 UT) capsule TAKE 1 CAPSULE BY MOUTH EVERY DAY 90 capsule 3   diclofenac Sodium (VOLTAREN) 1 % GEL Apply 2 g topically 4 (four) times daily. 100 g 3   FARXIGA 10 MG TABS tablet TAKE 1 TABLET BY MOUTH EVERY DAY 90 tablet 3   ferrous sulfate 325 (65 FE) MG tablet TAKE 1 TABLET BY MOUTH EVERY DAY 90 tablet 1   glucose blood (CONTOUR NEXT TEST) test strip Use to check blood sugars twice day 100 each 12   JANUMET 50-1000 MG tablet TAKE 1 TABLET BY MOUTH 2 (TWO) TIMES DAILY WITH A MEAL. 180 tablet 3   nitroGLYCERIN (NITROSTAT) 0.4 MG SL tablet Place 1 tablet (0.4 mg total) under the tongue every 5 (five) minutes as needed for chest pain. 25 tablet 6   omeprazole (PRILOSEC) 20 MG capsule TAKE 1 CAPSULE BY MOUTH EVERY DAY 90 capsule 3   tamsulosin (FLOMAX) 0.4 MG CAPS capsule Take 0.4 mg by mouth daily.     aspirin EC 81 MG tablet Take 1 tablet (81 mg total) by mouth daily. 100 tablet 3   No facility-administered medications prior to visit.     ROS: Review of Systems  Constitutional:  Negative for appetite change, fatigue and unexpected weight change.  HENT:  Negative for congestion, nosebleeds, sneezing, sore throat and trouble swallowing.   Eyes:  Negative for itching and visual disturbance.  Respiratory:  Negative for cough.   Cardiovascular:  Negative for chest pain, palpitations and leg swelling.  Gastrointestinal:  Negative for abdominal distention, blood in stool, diarrhea and nausea.  Genitourinary:  Negative for frequency and hematuria.  Musculoskeletal:  Positive for arthralgias and gait problem. Negative for back pain, joint swelling and neck pain.  Skin:  Negative for rash.  Neurological:  Negative for dizziness, tremors, speech difficulty and weakness.  Psychiatric/Behavioral:  Negative for agitation, dysphoric mood and sleep disturbance. The patient is not nervous/anxious.    Objective:  BP (!) 142/90 (BP Location: Left Arm)   Pulse 85   Temp 98.2 F (36.8 C) (Oral)   Ht 5\' 10"  (1.778 m)   Wt (!) 306 lb 9.6 oz (139.1 kg)   SpO2 95%   BMI 43.99 kg/m   BP Readings from Last 3 Encounters:  02/09/21 (!) 142/90  10/22/20 134/78  07/21/20 128/80    Wt Readings from  Last 3 Encounters:  02/09/21 (!) 306 lb 9.6 oz (139.1 kg)  10/22/20 (!) 309 lb 3.2 oz (140.3 kg)  07/21/20 298 lb (135.2 kg)    Physical Exam Constitutional:      General: He is not in acute distress.    Appearance: He is well-developed. He is obese.     Comments: NAD  Eyes:     Conjunctiva/sclera: Conjunctivae normal.     Pupils: Pupils are equal, round, and reactive to light.  Neck:     Thyroid: No thyromegaly.     Vascular: No JVD.  Cardiovascular:     Rate and Rhythm: Normal rate and regular rhythm.     Heart sounds: Normal heart sounds. No murmur heard.   No friction rub. No gallop.  Pulmonary:     Effort: Pulmonary effort is normal. No respiratory distress.     Breath sounds: Normal breath sounds. No wheezing or rales.   Chest:     Chest wall: No tenderness.  Abdominal:     General: Bowel sounds are normal. There is no distension.     Palpations: Abdomen is soft. There is no mass.     Tenderness: There is no abdominal tenderness. There is no guarding or rebound.  Musculoskeletal:        General: Tenderness present. Normal range of motion.     Cervical back: Normal range of motion.  Lymphadenopathy:     Cervical: No cervical adenopathy.  Skin:    General: Skin is warm and dry.     Findings: No rash.  Neurological:     Mental Status: He is alert and oriented to person, place, and time.     Cranial Nerves: No cranial nerve deficit.     Motor: No abnormal muscle tone.     Coordination: Coordination normal.     Gait: Gait normal.     Deep Tendon Reflexes: Reflexes are normal and symmetric.  Psychiatric:        Behavior: Behavior normal.        Thought Content: Thought content normal.        Judgment: Judgment normal.   R knee w/pain  Lab Results  Component Value Date   WBC 8.4 07/17/2020   HGB 15.0 07/17/2020   HCT 44.9 07/17/2020   PLT 204 07/17/2020   GLUCOSE 126 (H) 07/17/2020   CHOL 113 07/17/2020   TRIG 109 07/17/2020   HDL 35 (L) 07/17/2020   LDLDIRECT 149.9 03/12/2007   LDLCALC 59 07/17/2020   ALT 27 10/16/2020   AST 20 10/16/2020   NA 141 10/16/2020   K 3.6 10/16/2020   CL 106 10/16/2020   CREATININE 0.9 10/16/2020   BUN 15 10/16/2020   CO2 26 (A) 10/16/2020   TSH 1.77 12/20/2016   PSA 0.75 07/17/2020   PSA 0.75 07/17/2020   HGBA1C 6.7 10/16/2020   MICROALBUR 0.2 07/17/2020    DG Elbow Complete Right  Result Date: 07/01/2019 CLINICAL DATA:  Posterior right elbow pain and swelling and redness. Limited range of motion. EXAM: RIGHT ELBOW - COMPLETE 3+ VIEW COMPARISON:  None. FINDINGS: There is no fracture or dislocation or joint effusion. There are slight arthritic changes of the elbow joint. Prominent enthesophyte at the triceps insertion. There is nonspecific subcutaneous  edema seen posteriorly and medially at the left elbow. IMPRESSION: No acute osseous abnormality. Nonspecific subcutaneous edema. Electronically Signed   By: Lorriane Shire M.D.   On: 07/01/2019 10:26    Assessment & Plan:   Problem  List Items Addressed This Visit     DM2 (diabetes mellitus, type 2) (HCC)    Mounjaro for wt loss      Relevant Medications   tirzepatide (MOUNJARO) 2.5 MG/0.5ML Pen   KNEE PAIN    Worse F/u w/Dr Dion Saucier Cont w/Celebrex Mounjaro for wt loss info MRI pending       Obesity    Mounjaro for wt loss/DM advised - Rx given      Relevant Medications   tirzepatide (MOUNJARO) 2.5 MG/0.5ML Pen      Meds ordered this encounter  Medications   tirzepatide (MOUNJARO) 2.5 MG/0.5ML Pen    Sig: Inject 2.5 mg into the skin once a week.    Dispense:  2 mL    Refill:  3      Follow-up: Return in about 3 months (around 05/12/2021) for a follow-up visit.  Sonda Primes, MD   X-rays show

## 2021-02-09 NOTE — Assessment & Plan Note (Signed)
Worse F/u w/Dr Dion Saucier Cont w/Celebrex Greggory Keen for wt loss info MRI pending

## 2021-02-09 NOTE — Assessment & Plan Note (Signed)
Mounjaro for wt loss

## 2021-02-09 NOTE — Assessment & Plan Note (Signed)
Mounjaro for wt loss/DM advised - Rx given

## 2021-02-22 ENCOUNTER — Other Ambulatory Visit: Payer: Self-pay | Admitting: Internal Medicine

## 2021-03-19 ENCOUNTER — Other Ambulatory Visit: Payer: Self-pay | Admitting: Internal Medicine

## 2021-04-15 IMAGING — DX DG ELBOW COMPLETE 3+V*L*
4 series · 4 of 4 positions shown · non-contrast
Comparison: None.

CLINICAL DATA: Fall with elbow pain and bruising

EXAM:
LEFT ELBOW - COMPLETE 3+ VIEW

[elbow ap]
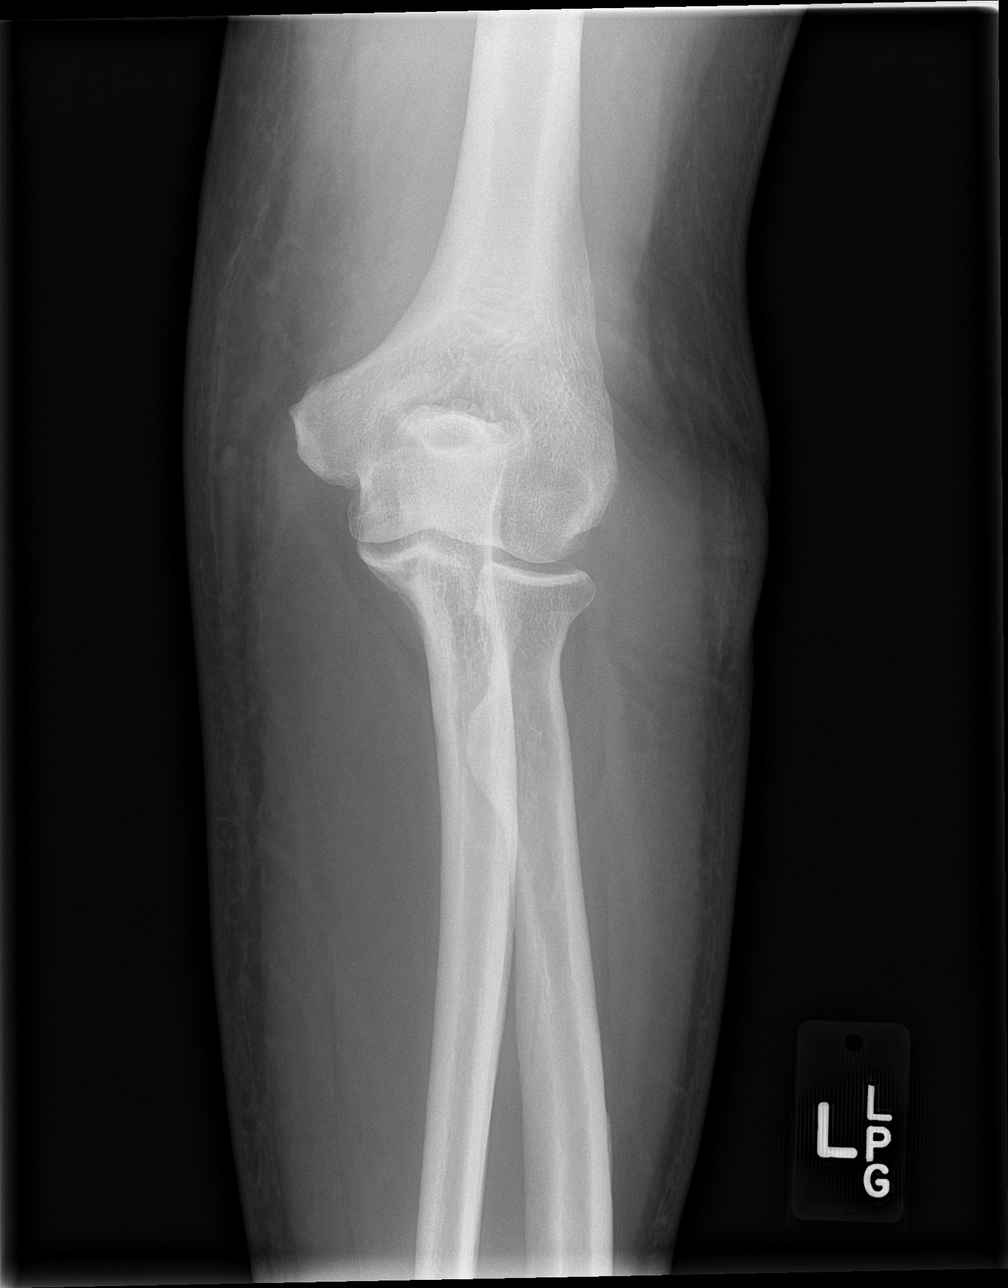

[elbow obl (1 of 2)]
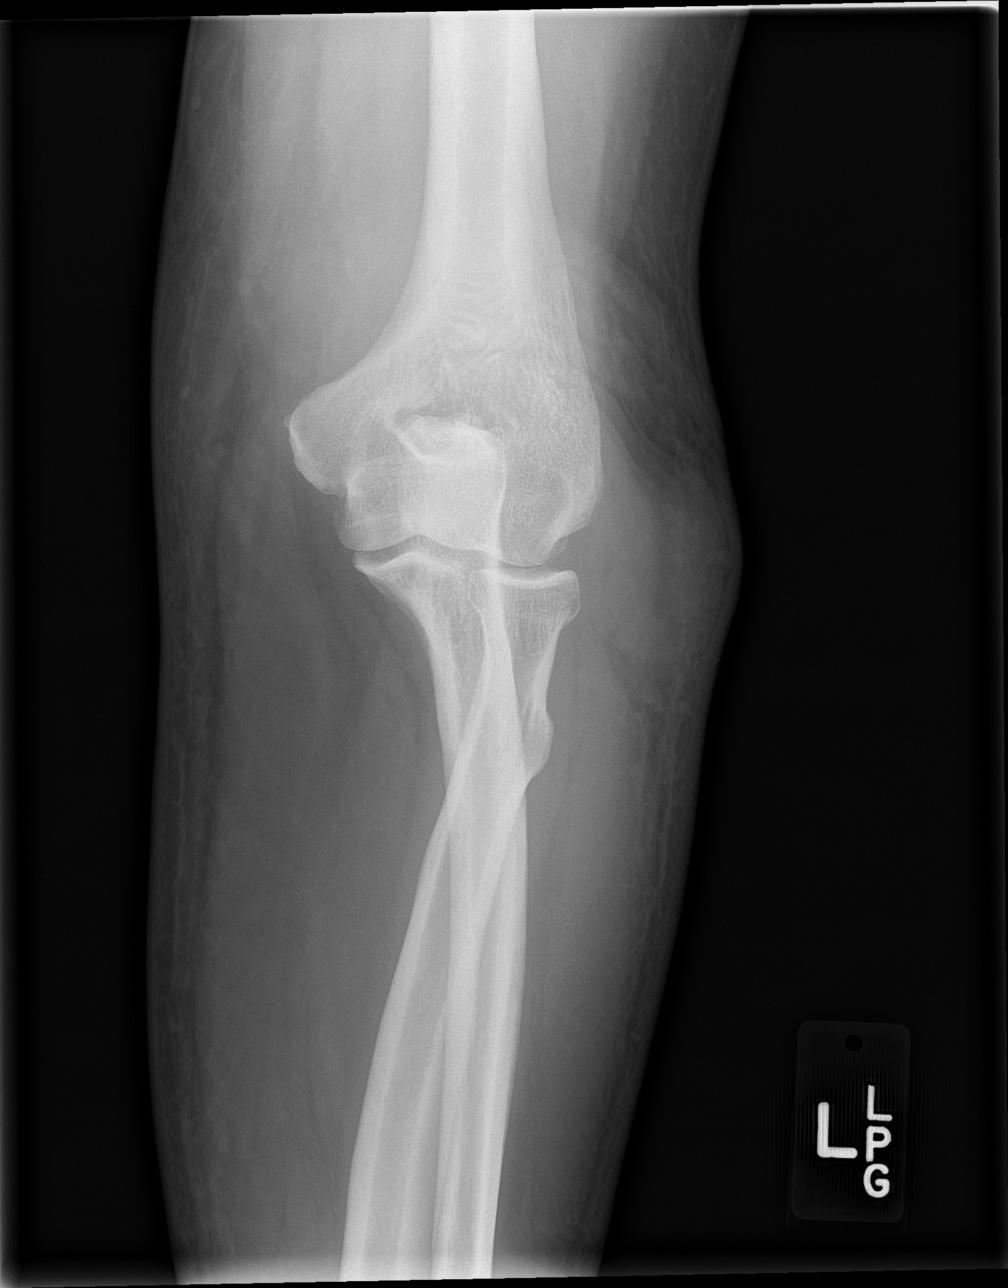

[elbow obl (2 of 2)]
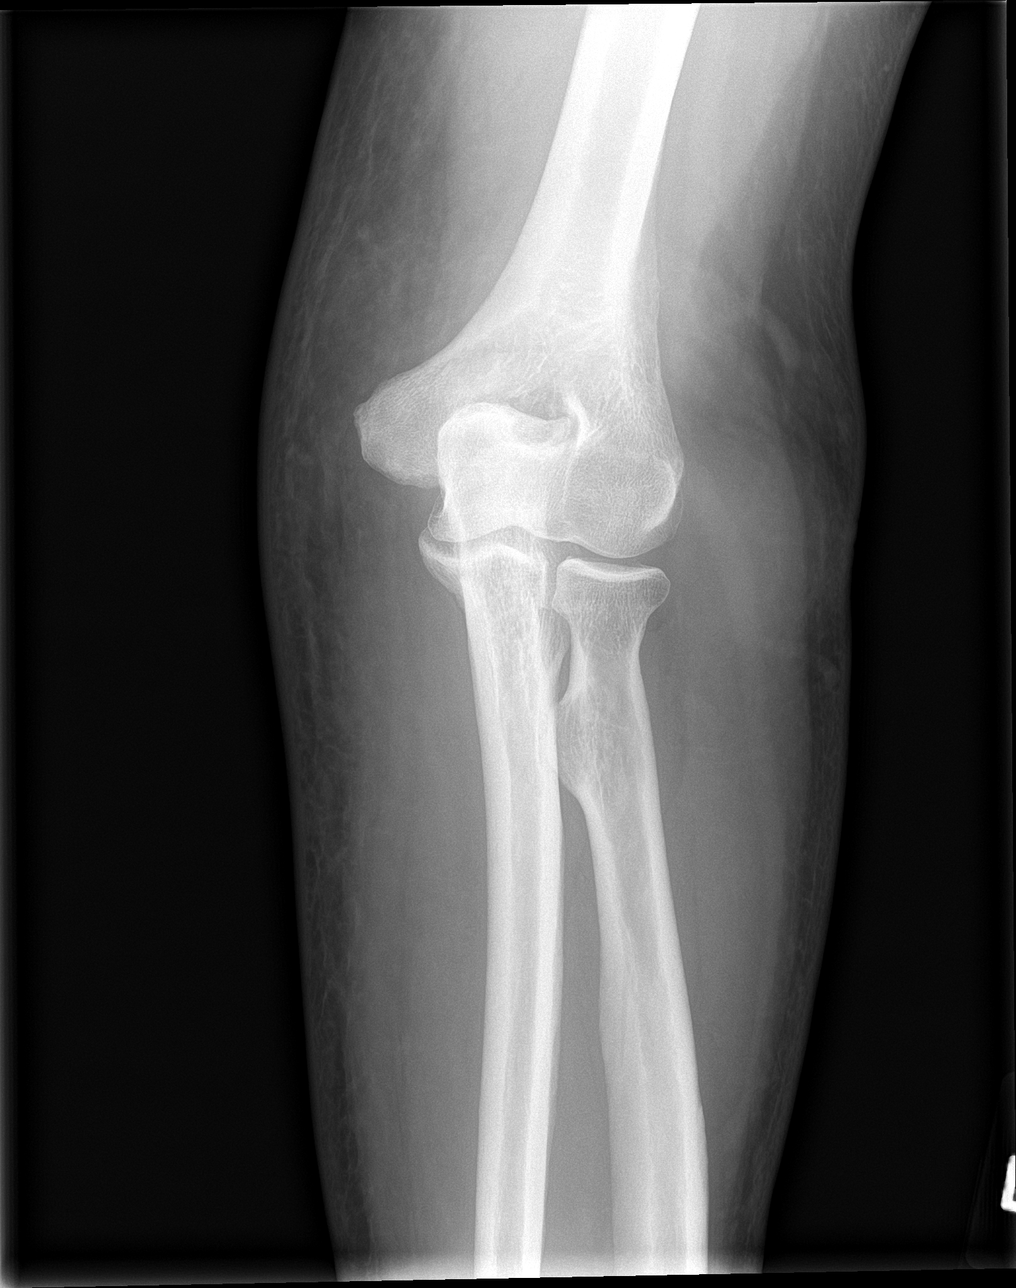

[elbow lat]
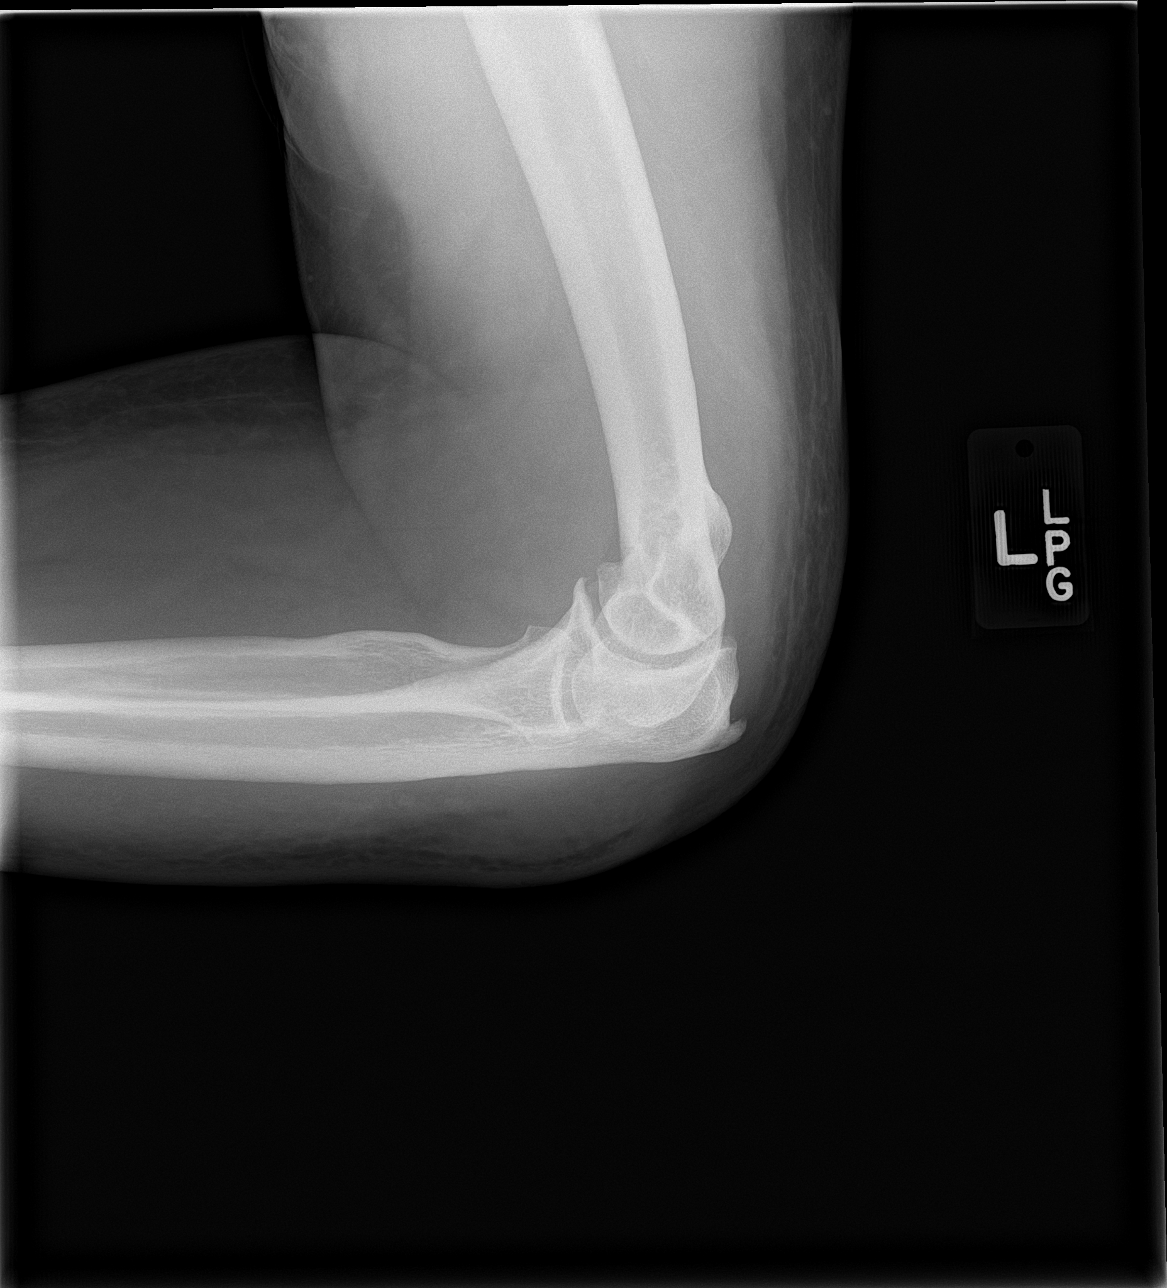

[4 of 4 positions shown; findings below may reference images not displayed]

FINDINGS: No fracture or malalignment. No significant elbow effusion. Mild
degenerative changes. Moderate soft tissue swelling. Probable
hematoma posterior to the olecranon.
IMPRESSION: No acute osseous abnormality.

## 2021-04-15 IMAGING — DX DG FOREARM 2V*L*
2 series · 2 of 2 positions shown · non-contrast
Comparison: None.

CLINICAL DATA: Fall with pain and swelling

EXAM:
LEFT FOREARM - 2 VIEW

[forearm ap]
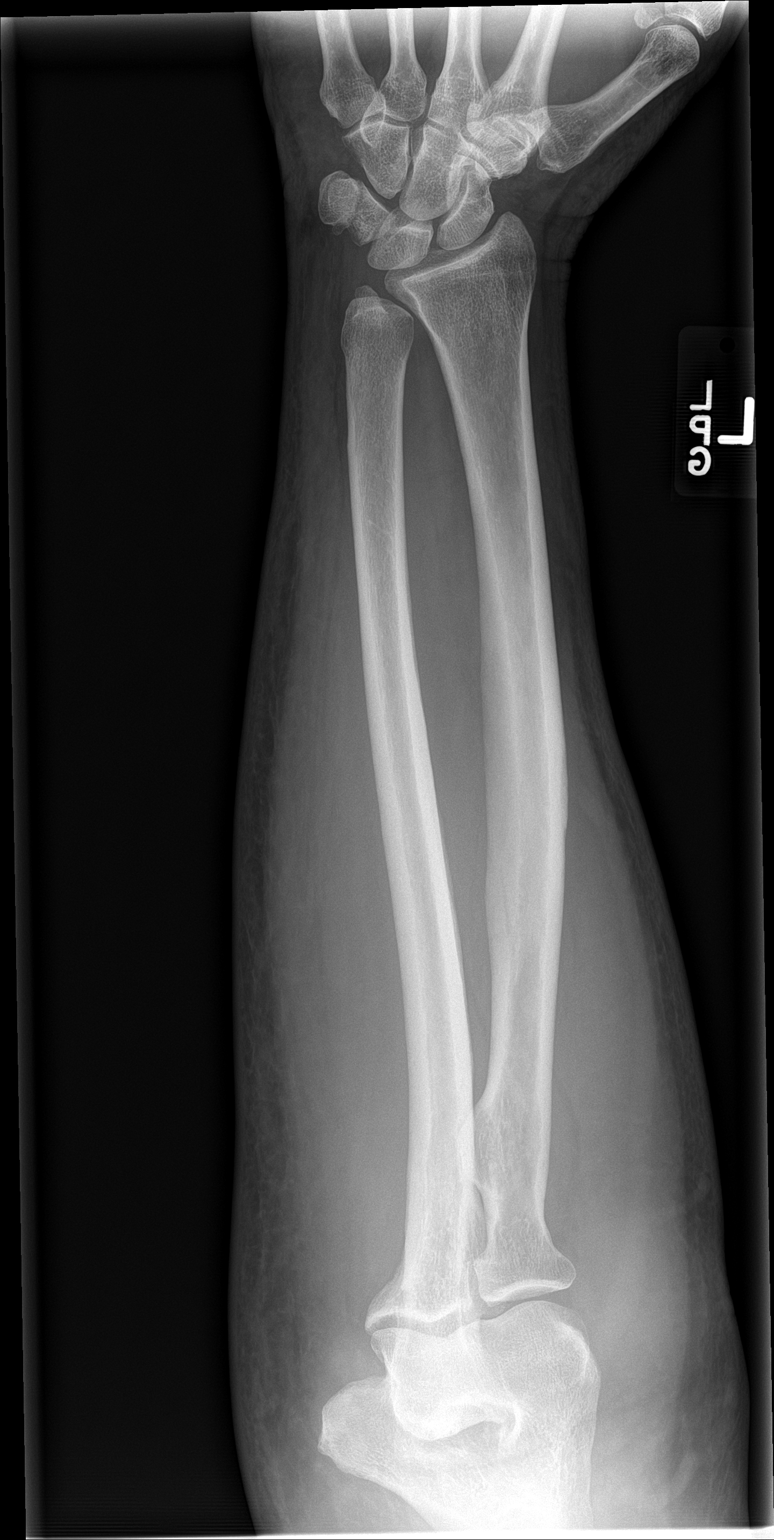

[forearm lat]
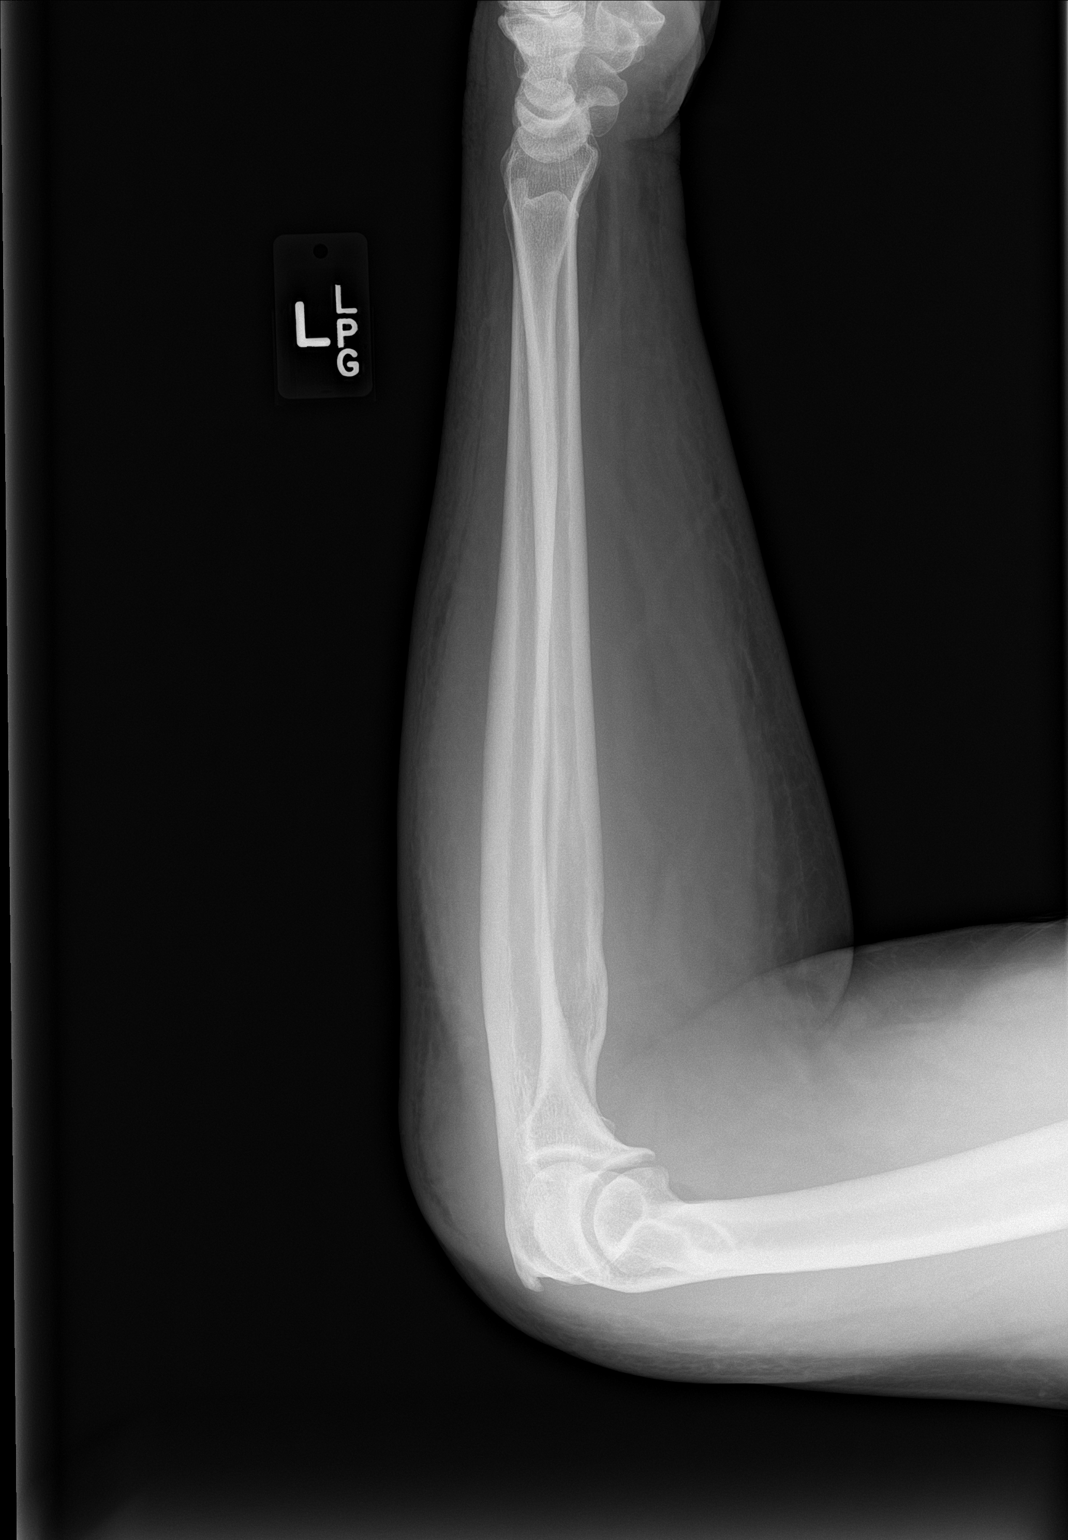

[2 of 2 positions shown; findings below may reference images not displayed]

FINDINGS: No fracture or malalignment. Moderate soft tissue swelling. No
radiopaque foreign body
IMPRESSION: No acute osseous abnormality.

## 2021-05-05 ENCOUNTER — Other Ambulatory Visit: Payer: Self-pay | Admitting: Cardiology

## 2021-05-13 ENCOUNTER — Ambulatory Visit: Payer: BC Managed Care – PPO | Admitting: Internal Medicine

## 2021-05-21 ENCOUNTER — Other Ambulatory Visit: Payer: Self-pay

## 2021-05-21 ENCOUNTER — Encounter: Payer: Self-pay | Admitting: Internal Medicine

## 2021-05-21 ENCOUNTER — Ambulatory Visit: Payer: BC Managed Care – PPO | Admitting: Internal Medicine

## 2021-05-21 ENCOUNTER — Other Ambulatory Visit: Payer: Self-pay | Admitting: Internal Medicine

## 2021-05-21 DIAGNOSIS — N181 Chronic kidney disease, stage 1: Secondary | ICD-10-CM

## 2021-05-21 DIAGNOSIS — R972 Elevated prostate specific antigen [PSA]: Secondary | ICD-10-CM

## 2021-05-21 DIAGNOSIS — E1122 Type 2 diabetes mellitus with diabetic chronic kidney disease: Secondary | ICD-10-CM | POA: Diagnosis not present

## 2021-05-21 DIAGNOSIS — Z6841 Body Mass Index (BMI) 40.0 and over, adult: Secondary | ICD-10-CM

## 2021-05-21 DIAGNOSIS — M65342 Trigger finger, left ring finger: Secondary | ICD-10-CM | POA: Diagnosis not present

## 2021-05-21 DIAGNOSIS — I2510 Atherosclerotic heart disease of native coronary artery without angina pectoris: Secondary | ICD-10-CM

## 2021-05-21 DIAGNOSIS — Z794 Long term (current) use of insulin: Secondary | ICD-10-CM

## 2021-05-21 MED ORDER — TIRZEPATIDE 5 MG/0.5ML ~~LOC~~ SOAJ: 5.0000 mg | SUBCUTANEOUS | 3 refills | Status: DC

## 2021-05-21 NOTE — Patient Instructions (Signed)
°  Get a trigger finger splint

## 2021-05-21 NOTE — Assessment & Plan Note (Signed)
Will increase Mounjaro dose 

## 2021-05-21 NOTE — Assessment & Plan Note (Signed)
Check PSA. ?

## 2021-05-21 NOTE — Assessment & Plan Note (Signed)
Relapsing F/u w/Ortho Get a trigger finger splint

## 2021-05-21 NOTE — Assessment & Plan Note (Signed)
No CP 

## 2021-05-21 NOTE — Progress Notes (Signed)
Subjective:  Patient ID: Andrew Carson, male    DOB: 1956-01-01  Age: 66 y.o. MRN: [id number]  CC: No chief complaint on file.   HPI Chueyee Koeppel Waldroup presents for DM, obesity, HTN  Outpatient Medications Prior to Visit  Medication Sig Dispense Refill   atorvastatin (LIPITOR) 80 MG tablet TAKE 1 TABLET BY MOUTH EVERY DAY 90 tablet 3   Bayer Microlet Lancets lancets Use to help check blood sugar twice a day 100 each 12   benazepril-hydrochlorthiazide (LOTENSIN HCT) 20-12.5 MG tablet TAKE 1 TABLET BY MOUTH EVERY DAY 90 tablet 2   carvedilol (COREG) 12.5 MG tablet TAKE 1 TABLET BY MOUTH TWICE A DAY WITH MEALS. Please keep 07/02/21 appt for future refills. 180 tablet 0   celecoxib (CELEBREX) 200 MG capsule TAKE 1 CAPSULE (200 MG TOTAL) BY MOUTH DAILY AS NEEDED FOR MODERATE PAIN. 90 capsule 0   clopidogrel (PLAVIX) 75 MG tablet TAKE 1 TABLET BY MOUTH EVERY DAY. Please keep 07/02/21 appt for future refills. 90 tablet 0   D3-1000 25 MCG (1000 UT) capsule TAKE 1 CAPSULE BY MOUTH EVERY DAY 90 capsule 3   diclofenac Sodium (VOLTAREN) 1 % GEL Apply 2 g topically 4 (four) times daily. 100 g 3   FARXIGA 10 MG TABS tablet TAKE 1 TABLET BY MOUTH EVERY DAY 90 tablet 3   ferrous sulfate 325 (65 FE) MG tablet TAKE 1 TABLET BY MOUTH EVERY DAY 90 tablet 1   glucose blood (CONTOUR NEXT TEST) test strip Use to check blood sugars twice day 100 each 12   JANUMET 50-1000 MG tablet TAKE 1 TABLET BY MOUTH 2 (TWO) TIMES DAILY WITH A MEAL. 180 tablet 3   nitroGLYCERIN (NITROSTAT) 0.4 MG SL tablet Place 1 tablet (0.4 mg total) under the tongue every 5 (five) minutes as needed for chest pain. 25 tablet 6   omeprazole (PRILOSEC) 20 MG capsule TAKE 1 CAPSULE BY MOUTH EVERY DAY 90 capsule 3   tamsulosin (FLOMAX) 0.4 MG CAPS capsule Take 0.4 mg by mouth daily.     tirzepatide [facility]) 2.5 MG/0.5ML Pen Inject 2.5 mg into the skin once a week. 2 mL 3   aspirin EC 81 MG tablet Take 1 tablet (81 mg total) by  mouth daily. 100 tablet 3   No facility-administered medications prior to visit.    ROS: Review of Systems  Constitutional:  Negative for appetite change, fatigue and unexpected weight change.  HENT:  Negative for congestion, nosebleeds, sneezing, sore throat and trouble swallowing.   Eyes:  Negative for itching and visual disturbance.  Respiratory:  Negative for cough.   Cardiovascular:  Negative for chest pain, palpitations and leg swelling.  Gastrointestinal:  Negative for abdominal distention, blood in stool, diarrhea and nausea.  Genitourinary:  Negative for frequency and hematuria.  Musculoskeletal:  Negative for back pain, gait problem, joint swelling and neck pain.  Skin:  Positive for rash.  Neurological:  Negative for dizziness, tremors, speech difficulty and weakness.  Psychiatric/Behavioral:  Negative for agitation, dysphoric mood and sleep disturbance. The patient is not nervous/anxious.    Objective:  BP 126/82 (BP Location: Left Arm, Patient Position: Sitting, Cuff Size: Large)    Pulse 92    Temp 98.3 F (36.8 C) (Oral)    Ht 5\' 10"  (1.778 m)    Wt (!) 305 lb (138.3 kg)    SpO2 95%    BMI 43.76 kg/m   BP Readings from Last 3 Encounters:  05/21/21 126/82  02/09/21 Marland Kitchen)  142/90  10/22/20 134/78    Wt Readings from Last 3 Encounters:  05/21/21 (!) 305 lb (138.3 kg)  02/09/21 (!) 306 lb 9.6 oz (139.1 kg)  10/22/20 (!) 309 lb 3.2 oz (140.3 kg)    Physical Exam Constitutional:      General: He is not in acute distress.    Appearance: He is well-developed. He is obese.     Comments: NAD  Eyes:     Conjunctiva/sclera: Conjunctivae normal.     Pupils: Pupils are equal, round, and reactive to light.  Neck:     Thyroid: No thyromegaly.     Vascular: No JVD.  Cardiovascular:     Rate and Rhythm: Normal rate and regular rhythm.     Heart sounds: Normal heart sounds. No murmur heard.   No friction rub. No gallop.  Pulmonary:     Effort: Pulmonary effort is normal.  No respiratory distress.     Breath sounds: Normal breath sounds. No wheezing or rales.  Chest:     Chest wall: No tenderness.  Abdominal:     General: Bowel sounds are normal. There is no distension.     Palpations: Abdomen is soft. There is no mass.     Tenderness: There is no abdominal tenderness. There is no guarding or rebound.  Musculoskeletal:        General: No tenderness. Normal range of motion.     Cervical back: Normal range of motion.  Lymphadenopathy:     Cervical: No cervical adenopathy.  Skin:    General: Skin is warm and dry.     Findings: No rash.  Neurological:     Mental Status: He is alert and oriented to person, place, and time.     Cranial Nerves: No cranial nerve deficit.     Motor: No abnormal muscle tone.     Coordination: Coordination normal.     Gait: Gait normal.     Deep Tendon Reflexes: Reflexes are normal and symmetric.  Psychiatric:        Behavior: Behavior normal.        Thought Content: Thought content normal.        Judgment: Judgment normal.    Lab Results  Component Value Date   WBC 8.4 07/17/2020   HGB 15.0 07/17/2020   HCT 44.9 07/17/2020   PLT 204 07/17/2020   GLUCOSE 126 (H) 07/17/2020   CHOL 113 07/17/2020   TRIG 109 07/17/2020   HDL 35 (L) 07/17/2020   LDLDIRECT 149.9 03/12/2007   LDLCALC 59 07/17/2020   ALT 27 10/16/2020   AST 20 10/16/2020   NA 141 10/16/2020   K 3.6 10/16/2020   CL 106 10/16/2020   CREATININE 0.9 10/16/2020   BUN 15 10/16/2020   CO2 26 (A) 10/16/2020   TSH 1.77 12/20/2016   PSA 0.75 07/17/2020   PSA 0.75 07/17/2020   HGBA1C 6.7 10/16/2020   MICROALBUR 0.2 07/17/2020    DG Elbow Complete Right  Result Date: 07/01/2019 CLINICAL DATA:  Posterior right elbow pain and swelling and redness. Limited range of motion. EXAM: RIGHT ELBOW - COMPLETE 3+ VIEW COMPARISON:  None. FINDINGS: There is no fracture or dislocation or joint effusion. There are slight arthritic changes of the elbow joint. Prominent  enthesophyte at the triceps insertion. There is nonspecific subcutaneous edema seen posteriorly and medially at the left elbow. IMPRESSION: No acute osseous abnormality. Nonspecific subcutaneous edema. Electronically Signed   By: Francene Boyers M.D.   On: 07/01/2019 10:26  Assessment & Plan:   Problem List Items Addressed This Visit     Coronary artery disease    No CP      DM2 (diabetes mellitus, type 2) (HCC)    Will increase Mounjaro dose      Relevant Medications   tirzepatide (MOUNJARO) 5 MG/0.5ML Pen   Elevated PSA    Check PSA      Obesity    Will increase Mounjaro dose      Relevant Medications   tirzepatide (MOUNJARO) 5 MG/0.5ML Pen   Trigger finger of left hand    Relapsing F/u w/Ortho Get a trigger finger splint         Meds ordered this encounter  Medications   tirzepatide (MOUNJARO) 5 MG/0.5ML Pen    Sig: Inject 5 mg into the skin once a week.    Dispense:  6 mL    Refill:  3      Follow-up: No follow-ups on file.  Walker Kehr, MD

## 2021-05-28 ENCOUNTER — Other Ambulatory Visit: Payer: Self-pay | Admitting: Internal Medicine

## 2021-06-13 ENCOUNTER — Other Ambulatory Visit: Payer: Self-pay | Admitting: Cardiology

## 2021-06-23 ENCOUNTER — Other Ambulatory Visit: Payer: Self-pay | Admitting: Internal Medicine

## 2021-06-28 ENCOUNTER — Encounter: Payer: Self-pay | Admitting: Physician Assistant

## 2021-06-28 NOTE — Progress Notes (Addendum)
? ?Cardiology Office Note   ? ?Date:  07/02/2021  ? ?ID:  DEEJAY KOPPELMAN, DOB [birth year], MRN [id number] ? ?PCP:  Plotnikov, Georgina Quint, MD  ?Cardiologist:  Armanda Magic, MD  ?Electrophysiologist:  None  ? ?Chief Complaint: f/u CAD ? ?History of Present Illness:  ? ?ELIASAR HLAVATY is a 66 y.o. male with history of CAD with NSTEMI 07/2018 in [place] s/p DES to LAD with residual RCA/LCx disease, HTN, GERD, T2DM, HLD, osteoarthritis, morbid obesity, venous insufficiency, vitamin D deficiency who is seen for cardiac follow-up. 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 He was hospitalized with NSTEMI and underwent cath showing 90% LAD which was culprit lesion treated with DES, 70% distal Cx, 70% distal RCA. The cath note indicated plan for intervention of residual lesions as outpatient but these ultimately were treated medically. 2D echo 07/2018 showed EF 50-55% low normal, anteroseptal hypokinesis, trace MR. He has done well since that time. Nuclear stress test 11/2018 was normal. ? ?He presents back to clinic doing well without any recent angina or dyspnea. He is tolerating all medication without difficulty. He continues to drive for [employer], sometimes as far as [place]. He does not do any loading of the truck. He recently started Ozempic. We discussed monitoring of BP going forward. He has f/u with PCP in 1 month at which time he states labs will be drawn with physical (primary care does appear to be the ones following labs). ? ?Labwork independently reviewed: ?10/2020 K 3.6, Cr 0.9, LFTS ok, A1C 6.7 ?07/2020 CBC wnl, LDL 59, HDL 35, trig 109 ? ?Cardiology Studies:  ? ?Studies reviewed are outlined and summarized above. Reports included below if pertinent.  ? ?Scan reviewed from 07/2018, see full report- 90% LAD which was culprit lesion treated with DES, 70% distal Cx, 70% distal RCA with plan for intervention as outpatient. ? ?2D Echo 07/2018 EF 50-55% low normal,  anteroseptal hypokinesis, trace MR  ? ? ?Past Medical History:  ?Diagnosis Date  ? Anemia 08/11/2017  ? Colon  2017 Dr Leone Payor Start iron 2019 GI ref if Hgb not better  ? Coronary artery disease 08/07/2018  ? a. CAD with NSTEMI 07/2018 in [place] s/p DES to LAD with residual RCA/LCx disease.  ? Diabetes mellitus 2011  ? type 2  ? Dyslipidemia (high LDL; low HDL) 10/15/2010  ? Elevated PSA 09/06/2013  ? 5/15 due to UTI Dr Annabell Howells   ? Essential hypertension 02/23/2007  ? Finger pain, right 02/11/2011  ? 10/12 R 4th prox phal - likely a partial flexor tendon rupture   ? GERD 03/12/2007  ?  On Omeprazole  ? Hypogonadism male   ? KNEE PAIN 03/12/2007  ? 11/19 L   ? Obesity   ? OSTEOARTHRITIS 03/12/2007  ? Chronic     ? Phimosis 01/29/2016  ? 10/17 candida due to [place] - tolerable  ? URTICARIA 06/05/2009  ? Qualifier: Diagnosis of  By: Plotnikov MD, Georgina Quint   ? UTI (urinary tract infection) 09/06/2013  ? 5/15 dr Annabell Howells   ? Venous insufficiency 08/19/2011  ? B LE mild R>L   ? Vitamin D deficiency 03/14/2007  ? Chronic     ? ? ?Past Surgical History:  ?Procedure Laterality Date  ? LUMBAR LAMINECTOMY  07/1995  ? L5-S1  ? WISDOM TOOTH EXTRACTION    ? ? ?Current Medications: ?Current Meds  ?Medication Sig  ? aspirin EC 81 MG tablet Take 1  tablet (81 mg total) by mouth daily.  ? atorvastatin (LIPITOR) 80 MG tablet TAKE 1 TABLET BY MOUTH EVERY DAY  ? Bayer Microlet Lancets lancets Use to help check blood sugar twice a day  ? benazepril-hydrochlorthiazide (LOTENSIN HCT) 20-12.5 MG tablet TAKE 1 TABLET BY MOUTH EVERY DAY  ? carvedilol (COREG) 12.5 MG tablet TAKE 1 TABLET BY MOUTH TWICE A DAY WITH MEALS. Please keep 07/02/21 appt for future refills.  ? celecoxib (CELEBREX) 200 MG capsule TAKE 1 CAPSULE (200 MG TOTAL) BY MOUTH DAILY AS NEEDED FOR MODERATE PAIN.  ? clopidogrel (PLAVIX) 75 MG tablet TAKE 1 TABLET BY MOUTH EVERY DAY. Please keep 07/02/21 appt for future refills.  ? D3-1000 25 MCG (1000 UT) capsule TAKE 1 CAPSULE BY  MOUTH EVERY DAY  ? diclofenac Sodium (VOLTAREN) 1 % GEL Apply 2 g topically 4 (four) times daily.  ? FARXIGA 10 MG TABS tablet TAKE 1 TABLET BY MOUTH EVERY DAY  ? ferrous sulfate 325 (65 FE) MG tablet TAKE 1 TABLET BY MOUTH EVERY DAY  ? glucose blood (CONTOUR NEXT TEST) test strip Use to check blood sugars twice day  ? JANUMET 50-1000 MG tablet TAKE 1 TABLET BY MOUTH TWICE A DAY WITH A MEAL  ? nitroGLYCERIN (NITROSTAT) 0.4 MG SL tablet Place 1 tablet (0.4 mg total) under the tongue every 5 (five) minutes as needed for chest pain.  ? omeprazole (PRILOSEC) 20 MG capsule TAKE 1 CAPSULE BY MOUTH EVERY DAY  ? Semaglutide,0.25 or 0.5MG /DOS, (OZEMPIC, 0.25 OR 0.5 MG/DOSE,) 2 MG/1.5ML SOPN Inject 0.5 mg into the skin once a week.  ? tamsulosin (FLOMAX) 0.4 MG CAPS capsule Take 0.4 mg by mouth daily.  ? ? ?Allergies:   Triamcinolone  ? ?Social History  ? ?Socioeconomic History  ? Marital status: Married  ?  Spouse name: Not on file  ? Number of children: Not on file  ? Years of education: Not on file  ? Highest education level: Not on file  ?Occupational History  ? Not on file  ?Tobacco Use  ? Smoking status: Never  ?  Passive exposure: Never  ? Smokeless tobacco: Never  ?Substance and Sexual Activity  ? Alcohol use: No  ? Drug use: No  ? Sexual activity: Yes  ?Other Topics Concern  ? Not on file  ?Social History Narrative  ? Truck driver  ? Married 6 kids  ? ?Social Determinants of Health  ? ?Financial Resource Strain: Not on file  ?Food Insecurity: Not on file  ?Transportation Needs: Not on file  ?Physical Activity: Not on file  ?Stress: Not on file  ?Social Connections: Not on file  ?  ? ?Family History:  ?The patient's family history includes COPD in his mother; Heart disease (age of onset: 66) in his father; Heart disease (age of onset: 33) in his mother; Kidney disease in an other family member. There is no history of Colon cancer, Esophageal cancer, Rectal cancer, or Stomach cancer. ? ?ROS:   ?Please see the history  of present illness.  ?All other systems are reviewed and otherwise negative.  ? ? ?EKG(s)/Additional Labs  ? ?EKG:  EKG is ordered today, personally reviewed, demonstrating NSR 83bpm, TWI III no acute STT changes ? ?Recent Labs: ?07/17/2020: Hemoglobin 15.0; Platelets 204 ?10/16/2020: ALT 27; BUN 15; Creatinine 0.9; Potassium 3.6; Sodium 141  ?Recent Lipid Panel ?   ?Component Value Date/Time  ? CHOL 113 07/17/2020 1040  ? TRIG 109 07/17/2020 1040  ? HDL 35 (L) 07/17/2020 1040  ?  CHOLHDL 3.2 07/17/2020 1040  ? VLDL 30 09/18/2015 0817  ? LDLCALC 59 07/17/2020 1040  ? LDLDIRECT 149.9 03/12/2007 0834  ? ? ?PHYSICAL EXAM:   ? ?VS:  BP 118/80   Pulse 90   Ht 5\' 11"  (1.803 m)   Wt 299 lb (135.6 kg)   SpO2 96%   BMI 41.70 kg/m?   BMI: Body mass index is 41.7 kg/m?. ? ?GEN: Well nourished, well developed obese male in no acute distress ?HEENT: normocephalic, atraumatic ?Neck: no JVD, carotid bruits, or masses ?Cardiac: RRR; no murmurs, rubs, or gallops, no edema  ?Respiratory:  clear to auscultation bilaterally, normal work of breathing ?GI: soft, nontender, nondistended, + BS ?MS: no deformity or atrophy ?Skin: warm and dry, mild hyperpigmentation changes of the skin on RLE ?Neuro:  Alert and Oriented x 3, Strength and sensation are intact, follows commands ?Psych: euthymic mood, full affect ? ?Wt Readings from Last 3 Encounters:  ?07/02/21 299 lb (135.6 kg)  ?05/21/21 (!) 305 lb (138.3 kg)  ?02/09/21 (!) 306 lb 9.6 oz (139.1 kg)  ?  ? ?ASSESSMENT & PLAN:  ? ?1. CAD - no recent angina, clinically doing well. 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 Reviewed testing indication with colleague familiar with [facility] guidelines. Given h/o of NSTEMI, requires stress testing every other year and documentation of tolerating all medications (which he is). We will arrange ETT. If this is nondiagnostic, anticipate scheduling Lexiscan nuclear stress test. Given drug interaction between Prilosec and Plavix, will change Prilosec to  equivalent dose of Protonix -> have asked him to review at next months' OV with primary care the long term plan for his PPI.  ? ?Shared Decision Making/Informed Consent ?The risks [chest pain, shortness of breath, cardi

## 2021-07-02 ENCOUNTER — Encounter: Payer: Self-pay | Admitting: Physician Assistant

## 2021-07-02 ENCOUNTER — Telehealth: Payer: Self-pay

## 2021-07-02 ENCOUNTER — Other Ambulatory Visit: Payer: Self-pay

## 2021-07-02 ENCOUNTER — Ambulatory Visit: Payer: BC Managed Care – PPO | Admitting: Physician Assistant

## 2021-07-02 VITALS — BP 118/80 | HR 90 | Ht 71.0 in | Wt 299.0 lb

## 2021-07-02 DIAGNOSIS — I10 Essential (primary) hypertension: Secondary | ICD-10-CM | POA: Diagnosis not present

## 2021-07-02 DIAGNOSIS — E785 Hyperlipidemia, unspecified: Secondary | ICD-10-CM

## 2021-07-02 DIAGNOSIS — E669 Obesity, unspecified: Secondary | ICD-10-CM

## 2021-07-02 DIAGNOSIS — I2510 Atherosclerotic heart disease of native coronary artery without angina pectoris: Secondary | ICD-10-CM

## 2021-07-02 DIAGNOSIS — E1169 Type 2 diabetes mellitus with other specified complication: Secondary | ICD-10-CM | POA: Diagnosis not present

## 2021-07-02 MED ORDER — PANTOPRAZOLE SODIUM 40 MG PO TBEC: 40.0000 mg | DELAYED_RELEASE_TABLET | Freq: Every day | ORAL | 0 refills | Status: DC

## 2021-07-02 MED ORDER — PANTOPRAZOLE SODIUM 40 MG PO TBEC: 40.0000 mg | DELAYED_RELEASE_TABLET | Freq: Every day | ORAL | 1 refills | Status: DC

## 2021-07-02 NOTE — Telephone Encounter (Signed)
Spoke with patient and made him aware to hold his Coreg , along with Janumet and [place] on the day of his stress test. Patient agreeable and voiced understanding.  ? ?Patient wanted to know if he could have a ninety day supply of Protonix.  ? ?Spoke with Dayna and she was agreeable with ninety day supply.  ? ?Rx(s) sent to pharmacy electronically. ?

## 2021-07-02 NOTE — Patient Instructions (Addendum)
Medication Instructions:  ?STOP Omeprazole ?START Protonix 40mg  Take 1 tablet once a day  ?Some studies suggest Prilosec/Omeprazole (acid reflux medicine) interacts with Plavix/Clopidogrel (blood thinner). We changed your Prilosec/Omeprazole to the equivalent dose of Protonix for less chance of interaction. Please talk to your primary care doctor about long term plan for this class of medication. ? ?We have observed that some patients on Ozempic will require less blood pressure medication over time as they lose weight. Please keep an eye on your blood pressure and let us know if trending 105 or less on the top number, or any symptoms of dizziness. ?*If you need a refill on your cardiac medications before your next appointment, please call your pharmacy* ? ? ?Lab Work: ?None Ordered ?If you have labs (blood work) drawn today and your tests are completely normal, you will receive your results only by: ?MyChart Message (if you have MyChart) OR ?A paper copy in the mail ?If you have any lab test that is abnormal or we need to change your treatment, we will call you to review the results. ? ? ?Testing/Procedures: ?Your physician has requested that you have an exercise tolerance test. For further information please visit [email or web address]. Please also follow instruction sheet, as given.  ?HOLD FARXIGA, COREG AND JANUMET ON MORNING OF TEST ? ?Follow-Up: ?At [facility], you and your health needs are our priority.  As part of our continuing mission to provide you with exceptional heart care, we have created designated Provider Care Teams.  These Care Teams include your primary Cardiologist (physician) and Advanced Practice Providers (APPs -  Physician Assistants and Nurse Practitioners) who all work together to provide you with the care you need, when you need it. ? ?We recommend signing up for the patient portal called "MyChart".  Sign up information is provided on this After Visit Summary.  MyChart is used to connect  with patients for Virtual Visits (Telemedicine).  Patients are able to view lab/test results, encounter notes, upcoming appointments, etc.  Non-urgent messages can be sent to your provider as well.   ?To learn more about what you can do with MyChart, go to [email or web address].   ? ?Your next appointment:   ?12 month(s) ? ?The format for your next appointment:   ?In Person ? ?Provider:   ?Fransico Him, MD   ? ? ?Other Instructions ?  ?

## 2021-07-05 ENCOUNTER — Other Ambulatory Visit: Payer: Self-pay | Admitting: Internal Medicine

## 2021-07-13 ENCOUNTER — Telehealth: Payer: Self-pay | Admitting: Internal Medicine

## 2021-07-13 NOTE — Telephone Encounter (Signed)
No note needed 

## 2021-07-22 ENCOUNTER — Other Ambulatory Visit: Payer: Self-pay | Admitting: Physician Assistant

## 2021-07-22 ENCOUNTER — Ambulatory Visit (INDEPENDENT_AMBULATORY_CARE_PROVIDER_SITE_OTHER): Payer: BC Managed Care – PPO

## 2021-07-22 ENCOUNTER — Telehealth: Payer: Self-pay | Admitting: *Deleted

## 2021-07-22 ENCOUNTER — Encounter: Payer: Self-pay | Admitting: *Deleted

## 2021-07-22 DIAGNOSIS — E1169 Type 2 diabetes mellitus with other specified complication: Secondary | ICD-10-CM | POA: Diagnosis not present

## 2021-07-22 DIAGNOSIS — I10 Essential (primary) hypertension: Secondary | ICD-10-CM

## 2021-07-22 DIAGNOSIS — E669 Obesity, unspecified: Secondary | ICD-10-CM

## 2021-07-22 DIAGNOSIS — E785 Hyperlipidemia, unspecified: Secondary | ICD-10-CM

## 2021-07-22 DIAGNOSIS — I2510 Atherosclerotic heart disease of native coronary artery without angina pectoris: Secondary | ICD-10-CM

## 2021-07-22 LAB — EXERCISE TOLERANCE TEST
Angina Index: 0
Estimated workload: 8.4
Exercise duration (min): 6 min
Exercise duration (sec): 56 s
MPHR: 155 {beats}/min
Peak HR: 153 {beats}/min
Percent HR: 98 %
RPE: 16
Rest HR: 89 {beats}/min

## 2021-07-22 NOTE — Telephone Encounter (Signed)
-----   Message from Laurann [place], [place] sent at 07/22/2021  3:40 PM EDT ----- ?Reviewed abnormal ETT with Dr. Mayford Knife -> she recommends exercise nuclear stress test. ?Called and spoke with patient about test results. Denies any angina while exercising on treadmill. BP was elevated during test but had held coreg that day for test and otherwise recently normotensive. He is agreeable to proceeding with exercise nuclear stress test. I will forward to our triage team to help get this set up - patient wants to have this done ASAP if possible. Would hold carvedilol, Farxiga and Janumet the AM of test. ? ?Shared Decision Making/Informed Consent: The risks [chest pain, shortness of breath, cardiac arrhythmias, dizziness, blood pressure fluctuations, myocardial infarction, stroke/transient ischemic attack, nausea, vomiting, allergic reaction, radiation exposure, metallic taste sensation and life-threatening complications (estimated to be 1 in 10,000)], benefits (risk stratification, diagnosing coronary artery disease, treatment guidance) and alternatives of a nuclear stress test were discussed in detail with Mr. Lunt and he agrees to proceed. I will place [id number] order. ? ? ? ?

## 2021-07-22 NOTE — Progress Notes (Signed)
Consent for ett-nuc ?

## 2021-07-22 NOTE — Telephone Encounter (Signed)
Called pt and left message that I will place instructions in a MyChart message and to call with any questions.  Will route to [employer], PA-C to sign attestation.  ?

## 2021-07-23 ENCOUNTER — Telehealth (HOSPITAL_COMMUNITY): Payer: Self-pay | Admitting: *Deleted

## 2021-07-23 NOTE — Telephone Encounter (Signed)
Already placed attestation order yesterday, thanks! ?

## 2021-07-23 NOTE — Telephone Encounter (Signed)
Patient given detailed instructions per Myocardial Perfusion Study Information Sheet for the test on 07/26/2021 at 1:15. Patient notified to arrive 15 minutes early and that it is imperative to arrive on time for appointment to keep from having the test rescheduled. ? If you need to cancel or reschedule your appointment, please call the office within 24 hours of your appointment. . Patient verbalized understanding.Andrew Carson ? ? ?

## 2021-07-26 ENCOUNTER — Ambulatory Visit (HOSPITAL_COMMUNITY): Payer: BC Managed Care – PPO | Attending: Internal Medicine

## 2021-07-26 DIAGNOSIS — I2510 Atherosclerotic heart disease of native coronary artery without angina pectoris: Secondary | ICD-10-CM | POA: Insufficient documentation

## 2021-07-26 MED ORDER — TECHNETIUM TC 99M TETROFOSMIN IV KIT
30.6000 | PACK | Freq: Once | INTRAVENOUS | Status: AC | PRN
  Administered 2021-07-26: 30.6 via INTRAVENOUS
  Filled 2021-07-26: qty 31

## 2021-07-27 ENCOUNTER — Ambulatory Visit (HOSPITAL_COMMUNITY): Payer: BC Managed Care – PPO | Attending: Cardiology

## 2021-07-27 LAB — MYOCARDIAL PERFUSION IMAGING
Estimated workload: 8
Exercise duration (min): 6 min
Exercise duration (sec): 43 s
LV dias vol: 106 mL (ref 62–150)
LV sys vol: 52 mL
MPHR: 155 {beats}/min
Nuc Stress EF: 50 %
Peak HR: 153 {beats}/min
Percent HR: 98 %
Rest HR: 80 {beats}/min
Rest Nuclear Isotope Dose: 31.2 mCi
Stress Nuclear Isotope Dose: 30.6 mCi
TID: 1.02

## 2021-07-27 MED ORDER — TECHNETIUM TC 99M TETROFOSMIN IV KIT
31.2000 | PACK | Freq: Once | INTRAVENOUS | Status: AC | PRN
  Administered 2021-07-27: 31.2 via INTRAVENOUS
  Filled 2021-07-27: qty 32

## 2021-07-31 ENCOUNTER — Other Ambulatory Visit: Payer: Self-pay | Admitting: Cardiology

## 2021-08-03 ENCOUNTER — Other Ambulatory Visit: Payer: Self-pay | Admitting: Internal Medicine

## 2021-08-09 ENCOUNTER — Ambulatory Visit (INDEPENDENT_AMBULATORY_CARE_PROVIDER_SITE_OTHER): Payer: BC Managed Care – PPO | Admitting: Internal Medicine

## 2021-08-09 ENCOUNTER — Encounter: Payer: Self-pay | Admitting: Internal Medicine

## 2021-08-09 ENCOUNTER — Ambulatory Visit: Payer: BC Managed Care – PPO

## 2021-08-09 VITALS — BP 140/78 | HR 90 | Temp 98.2°F | Ht 71.0 in | Wt 304.0 lb

## 2021-08-09 DIAGNOSIS — N181 Chronic kidney disease, stage 1: Secondary | ICD-10-CM

## 2021-08-09 DIAGNOSIS — I10 Essential (primary) hypertension: Secondary | ICD-10-CM

## 2021-08-09 DIAGNOSIS — Z0000 Encounter for general adult medical examination without abnormal findings: Secondary | ICD-10-CM | POA: Diagnosis not present

## 2021-08-09 DIAGNOSIS — E785 Hyperlipidemia, unspecified: Secondary | ICD-10-CM

## 2021-08-09 DIAGNOSIS — Z794 Long term (current) use of insulin: Secondary | ICD-10-CM

## 2021-08-09 DIAGNOSIS — E1122 Type 2 diabetes mellitus with diabetic chronic kidney disease: Secondary | ICD-10-CM | POA: Diagnosis not present

## 2021-08-09 MED ORDER — OZEMPIC (1 MG/DOSE) 2 MG/1.5ML ~~LOC~~ SOPN: 0.2500 mg | PEN_INJECTOR | SUBCUTANEOUS | 3 refills | Status: DC

## 2021-08-09 NOTE — Assessment & Plan Note (Signed)
Chronic Lotensin HCT, Coreg 

## 2021-08-09 NOTE — Assessment & Plan Note (Addendum)
?  We discussed age appropriate health related issues, including available/recomended screening tests and vaccinations. Labs were ordered to be later reviewed . All questions were answered. We discussed one or more of the following - seat belt use, use of sunscreen/sun exposure exercise, fall risk reduction, second hand smoke exposure, firearm use and storage, seat belt use, a need for adhering to healthy diet and exercise. ?Labs were reviewed on the pt's phone.  All questions were answered. ?Colonoscopy is due in 2027 (last in 2017) ? ? ?

## 2021-08-09 NOTE — Assessment & Plan Note (Signed)
Chronic ?Cardiac CT scoring info given 11/19 ?Lipitor ?

## 2021-08-09 NOTE — Assessment & Plan Note (Signed)
On Janumet, Farxiga, Increase Ozempic  ?

## 2021-08-09 NOTE — Progress Notes (Signed)
? ?Subjective:  ?Patient ID: Andrew Carson, male    DOB: [birth year]  Age: 66 y.o. MRN: [id number] ? ?CC: No chief complaint on file. ? ? ?HPI ?Andrew Carson presents for a well exam ? ?F/u on DM, obesity, CAD ? ?Outpatient Medications Prior to Visit  ?Medication Sig Dispense Refill  ? atorvastatin (LIPITOR) 80 MG tablet TAKE 1 TABLET BY MOUTH EVERY DAY 90 tablet 3  ? Bayer Microlet Lancets lancets Use to help check blood sugar twice a day 100 each 12  ? benazepril-hydrochlorthiazide (LOTENSIN HCT) 20-12.5 MG tablet TAKE 1 TABLET BY MOUTH EVERY DAY 90 tablet 0  ? carvedilol (COREG) 12.5 MG tablet TAKE 1 TABLET BY MOUTH TWICE A DAY WITH MEALS. PLEASE KEEP 07/02/21 APPT FOR FUTURE REFILLS. 180 tablet 3  ? celecoxib (CELEBREX) 200 MG capsule TAKE 1 CAPSULE (200 MG TOTAL) BY MOUTH DAILY AS NEEDED FOR MODERATE PAIN. 90 capsule 1  ? clopidogrel (PLAVIX) 75 MG tablet TAKE 1 TABLET BY MOUTH EVERY DAY. Please keep 07/02/21 appt for future refills. 90 tablet 3  ? D3-1000 25 MCG (1000 UT) capsule TAKE 1 CAPSULE BY MOUTH EVERY DAY 90 capsule 3  ? diclofenac Sodium (VOLTAREN) 1 % GEL Apply 2 g topically 4 (four) times daily. 100 g 3  ? FARXIGA 10 MG TABS tablet TAKE 1 TABLET BY MOUTH EVERY DAY 90 tablet 3  ? ferrous sulfate 325 (65 FE) MG tablet TAKE 1 TABLET BY MOUTH EVERY DAY 90 tablet 1  ? glucose blood (CONTOUR NEXT TEST) test strip Use to check blood sugars twice day 100 each 12  ? JANUMET 50-1000 MG tablet TAKE 1 TABLET BY MOUTH TWICE A DAY WITH A MEAL 180 tablet 3  ? nitroGLYCERIN (NITROSTAT) 0.4 MG SL tablet Place 1 tablet (0.4 mg total) under the tongue every 5 (five) minutes as needed for chest pain. 25 tablet 6  ? pantoprazole (PROTONIX) 40 MG tablet Take 1 tablet (40 mg total) by mouth daily. 90 tablet 0  ? tamsulosin (FLOMAX) 0.4 MG CAPS capsule Take 0.4 mg by mouth daily.    ? Semaglutide,0.25 or 0.5MG /DOS, (OZEMPIC, 0.25 OR 0.5 MG/DOSE,) 2 MG/1.5ML SOPN Inject 0.5 mg into the skin once a week. 6 mL 3  ? aspirin EC  81 MG tablet Take 1 tablet (81 mg total) by mouth daily. 100 tablet 3  ? ?No facility-administered medications prior to visit.  ? ? ?ROS: ?Review of Systems  ?Constitutional:  Negative for appetite change, fatigue and unexpected weight change.  ?HENT:  Negative for congestion, nosebleeds, sneezing, sore throat and trouble swallowing.   ?Eyes:  Negative for itching and visual disturbance.  ?Respiratory:  Negative for cough.   ?Cardiovascular:  Positive for leg swelling. Negative for chest pain and palpitations.  ?Gastrointestinal:  Negative for abdominal distention, blood in stool, diarrhea and nausea.  ?Genitourinary:  Negative for frequency and hematuria.  ?Musculoskeletal:  Positive for arthralgias, back pain and gait problem. Negative for joint swelling and neck pain.  ?Skin:  Positive for color change. Negative for rash.  ?Neurological:  Negative for dizziness, tremors, speech difficulty and weakness.  ?Psychiatric/Behavioral:  Negative for agitation, dysphoric mood and sleep disturbance. The patient is not nervous/anxious.   ? ?Objective:  ?BP 140/78 (BP Location: Left Arm, Patient Position: Sitting, Cuff Size: Normal)   Pulse 90   Temp 98.2 ?F (36.8 ?C) (Oral)   Ht 5\' 11"  (1.803 m)   Wt (!) 304 lb (137.9 kg)   SpO2 93%  BMI 42.40 kg/m?  ? ?BP Readings from Last 3 Encounters:  ?08/09/21 140/78  ?07/02/21 118/80  ?05/21/21 126/82  ? ? ?Wt Readings from Last 3 Encounters:  ?08/09/21 (!) 304 lb (137.9 kg)  ?07/26/21 299 lb (135.6 kg)  ?07/02/21 299 lb (135.6 kg)  ? ? ?Physical Exam ?Constitutional:   ?   General: He is not in acute distress. ?   Appearance: He is well-developed. He is obese.  ?   Comments: NAD  ?Eyes:  ?   Conjunctiva/sclera: Conjunctivae normal.  ?   Pupils: Pupils are equal, round, and reactive to light.  ?Neck:  ?   Thyroid: No thyromegaly.  ?   Vascular: No JVD.  ?Cardiovascular:  ?   Rate and Rhythm: Normal rate and regular rhythm.  ?   Heart sounds: Normal heart sounds. No murmur  heard. ?  No friction rub. No gallop.  ?Pulmonary:  ?   Effort: Pulmonary effort is normal. No respiratory distress.  ?   Breath sounds: Normal breath sounds. No wheezing or rales.  ?Chest:  ?   Chest wall: No tenderness.  ?Abdominal:  ?   General: Bowel sounds are normal. There is no distension.  ?   Palpations: Abdomen is soft. There is no mass.  ?   Tenderness: There is no abdominal tenderness. There is no guarding or rebound.  ?Genitourinary: ?   Prostate: Normal.  ?   Rectum: Normal. Guaiac result negative.  ?Musculoskeletal:     ?   General: Tenderness present. Normal range of motion.  ?   Cervical back: Normal range of motion.  ?   Right lower leg: Edema present.  ?   Left lower leg: Edema present.  ?Lymphadenopathy:  ?   Cervical: No cervical adenopathy.  ?Skin: ?   General: Skin is warm and dry.  ?   Findings: Erythema present. No rash.  ?Neurological:  ?   Mental Status: He is alert and oriented to person, place, and time.  ?   Cranial Nerves: No cranial nerve deficit.  ?   Motor: No abnormal muscle tone.  ?   Coordination: Coordination normal.  ?   Gait: Gait normal.  ?   Deep Tendon Reflexes: Reflexes are normal and symmetric.  ?Psychiatric:     ?   Behavior: Behavior normal.     ?   Thought Content: Thought content normal.     ?   Judgment: Judgment normal.  ? ? ?Lab Results  ?Component Value Date  ? WBC 8.4 07/17/2020  ? HGB 15.0 07/17/2020  ? HCT 44.9 07/17/2020  ? PLT 204 07/17/2020  ? GLUCOSE 126 (H) 07/17/2020  ? CHOL 113 07/17/2020  ? TRIG 109 07/17/2020  ? HDL 35 (L) 07/17/2020  ? LDLDIRECT 149.9 03/12/2007  ? LDLCALC 59 07/17/2020  ? ALT 27 10/16/2020  ? AST 20 10/16/2020  ? NA 141 10/16/2020  ? K 3.6 10/16/2020  ? CL 106 10/16/2020  ? CREATININE 0.9 10/16/2020  ? BUN 15 10/16/2020  ? CO2 26 (A) 10/16/2020  ? TSH 1.77 12/20/2016  ? PSA 0.75 07/17/2020  ? PSA 0.75 07/17/2020  ? HGBA1C 6.7 10/16/2020  ? MICROALBUR 0.2 07/17/2020  ? ? ?DG Elbow Complete Right ? ?Result Date: 07/01/2019 ?CLINICAL  DATA:  Posterior right elbow pain and swelling and redness. Limited range of motion. EXAM: RIGHT ELBOW - COMPLETE 3+ VIEW COMPARISON:  None. FINDINGS: There is no fracture or dislocation or joint effusion. There are slight arthritic changes  of the elbow joint. Prominent enthesophyte at the triceps insertion. There is nonspecific subcutaneous edema seen posteriorly and medially at the left elbow. IMPRESSION: No acute osseous abnormality. Nonspecific subcutaneous edema. Electronically Signed   By: Francene Boyers M.D.   On: 07/01/2019 10:26  ? ? ?Assessment & Plan:  ? ?Problem List Items Addressed This Visit   ? ? DM2 (diabetes mellitus, type 2) (HCC)  ?  On Janumet, Farxiga, Increase Ozempic  ? ?  ?  ? Relevant Medications  ? Semaglutide, 1 MG/DOSE, (OZEMPIC, 1 MG/DOSE,) 2 MG/1.5ML SOPN  ? Essential hypertension  ?  Chronic ?Lotensin HCT, Coreg ?  ?  ? Dyslipidemia (high LDL; low HDL)  ?  Chronic ?Cardiac CT scoring info given 11/19 ?Lipitor ?  ?  ? Well adult exam - Primary  ?   ?We discussed age appropriate health related issues, including available/recomended screening tests and vaccinations. Labs were ordered to be later reviewed . All questions were answered. We discussed one or more of the following - seat belt use, use of sunscreen/sun exposure exercise, fall risk reduction, second hand smoke exposure, firearm use and storage, seat belt use, a need for adhering to healthy diet and exercise. ?Labs were reviewed on the pt's phone.  All questions were answered. ?Colonoscopy is due in 2027 (last in 2017) ? ? ?  ?  ?  ? ? ?Meds ordered this encounter  ?Medications  ? Semaglutide, 1 MG/DOSE, (OZEMPIC, 1 MG/DOSE,) 2 MG/1.5ML SOPN  ?  Sig: Inject 0.25 mg into the skin once a week. Use 1 mg sq weekly. Can titrate up to 2 mg weekly if tolerated  ?  Dispense:  6 mL  ?  Refill:  3  ?  ? ? ?Follow-up: Return in about 3 months (around 11/09/2021) for a follow-up visit. ? ?Sonda Primes, MD ?

## 2021-08-20 ENCOUNTER — Ambulatory Visit: Payer: BC Managed Care – PPO

## 2021-09-08 ENCOUNTER — Other Ambulatory Visit: Payer: Self-pay | Admitting: Internal Medicine

## 2021-09-11 ENCOUNTER — Other Ambulatory Visit: Payer: Self-pay | Admitting: Cardiology

## 2021-10-21 ENCOUNTER — Other Ambulatory Visit: Payer: Self-pay | Admitting: Physician Assistant

## 2021-10-26 ENCOUNTER — Other Ambulatory Visit: Payer: Self-pay | Admitting: Internal Medicine

## 2021-10-26 MED ORDER — PANTOPRAZOLE SODIUM 40 MG PO TBEC: 40.0000 mg | DELAYED_RELEASE_TABLET | Freq: Every day | ORAL | 3 refills | Status: DC

## 2021-10-27 LAB — HM DIABETES EYE EXAM

## 2021-11-15 ENCOUNTER — Ambulatory Visit: Payer: BC Managed Care – PPO | Admitting: Internal Medicine

## 2021-11-15 ENCOUNTER — Encounter: Payer: Self-pay | Admitting: Internal Medicine

## 2021-11-15 DIAGNOSIS — I2510 Atherosclerotic heart disease of native coronary artery without angina pectoris: Secondary | ICD-10-CM

## 2021-11-15 DIAGNOSIS — N181 Chronic kidney disease, stage 1: Secondary | ICD-10-CM

## 2021-11-15 DIAGNOSIS — E1122 Type 2 diabetes mellitus with diabetic chronic kidney disease: Secondary | ICD-10-CM

## 2021-11-15 DIAGNOSIS — I10 Essential (primary) hypertension: Secondary | ICD-10-CM

## 2021-11-15 DIAGNOSIS — Z794 Long term (current) use of insulin: Secondary | ICD-10-CM

## 2021-11-15 DIAGNOSIS — Z0000 Encounter for general adult medical examination without abnormal findings: Secondary | ICD-10-CM

## 2021-11-15 DIAGNOSIS — Z6841 Body Mass Index (BMI) 40.0 and over, adult: Secondary | ICD-10-CM

## 2021-11-15 MED ORDER — SEMAGLUTIDE (2 MG/DOSE) 8 MG/3ML ~~LOC~~ SOPN: 2.0000 mg | PEN_INJECTOR | SUBCUTANEOUS | 11 refills | Status: DC

## 2021-11-15 NOTE — Progress Notes (Signed)
Subjective:  Patient ID: Andrew Carson, male    DOB: [birth year]  Age: 66 y.o. MRN: [id number]  CC: Follow-up   HPI Andrew Carson presents for DM, HTN, CAD  Outpatient Medications Prior to Visit  Medication Sig Dispense Refill   atorvastatin (LIPITOR) 80 MG tablet TAKE 1 TABLET BY MOUTH EVERY DAY 90 tablet 3   Bayer Microlet Lancets lancets Use to help check blood sugar twice a day 100 each 12   benazepril-hydrochlorthiazide (LOTENSIN HCT) 20-12.5 MG tablet TAKE 1 TABLET BY MOUTH EVERY DAY 90 tablet 3   carvedilol (COREG) 12.5 MG tablet TAKE 1 TABLET BY MOUTH TWICE A DAY WITH MEALS. PLEASE KEEP 07/02/21 APPT FOR FUTURE REFILLS. 180 tablet 3   celecoxib (CELEBREX) 200 MG capsule TAKE 1 CAPSULE (200 MG TOTAL) BY MOUTH DAILY AS NEEDED FOR MODERATE PAIN. 90 capsule 1   clopidogrel (PLAVIX) 75 MG tablet TAKE 1 TABLET BY MOUTH EVERY DAY. Please keep 07/02/21 appt for future refills. 90 tablet 3   D3-1000 25 MCG (1000 UT) capsule TAKE 1 CAPSULE BY MOUTH EVERY DAY 90 capsule 3   diclofenac Sodium (VOLTAREN) 1 % GEL Apply 2 g topically 4 (four) times daily. 100 g 3   FARXIGA 10 MG TABS tablet TAKE 1 TABLET BY MOUTH EVERY DAY 90 tablet 3   ferrous sulfate 325 (65 FE) MG tablet TAKE 1 TABLET BY MOUTH EVERY DAY 90 tablet 1   glucose blood (CONTOUR NEXT TEST) test strip Use to check blood sugars twice day 100 each 12   JANUMET 50-1000 MG tablet TAKE 1 TABLET BY MOUTH TWICE A DAY WITH A MEAL 180 tablet 3   nitroGLYCERIN (NITROSTAT) 0.4 MG SL tablet Place 1 tablet (0.4 mg total) under the tongue every 5 (five) minutes as needed for chest pain. 25 tablet 6   pantoprazole (PROTONIX) 40 MG tablet Take 1 tablet (40 mg total) by mouth daily. 90 tablet 3   tamsulosin (FLOMAX) 0.4 MG CAPS capsule Take 0.4 mg by mouth daily.     Semaglutide, 1 MG/DOSE, (OZEMPIC, 1 MG/DOSE,) 2 MG/1.5ML SOPN Inject 0.25 mg into the skin once a week. Use 1 mg sq weekly. Can titrate up to 2 mg weekly if tolerated 6 mL 3   aspirin  EC 81 MG tablet Take 1 tablet (81 mg total) by mouth daily. 100 tablet 3   No facility-administered medications prior to visit.    ROS: Review of Systems  Constitutional:  Negative for appetite change, fatigue and unexpected weight change.  HENT:  Negative for congestion, nosebleeds, sneezing, sore throat and trouble swallowing.   Eyes:  Negative for itching and visual disturbance.  Respiratory:  Negative for cough.   Cardiovascular:  Positive for leg swelling. Negative for chest pain and palpitations.  Gastrointestinal:  Negative for abdominal distention, blood in stool, diarrhea and nausea.  Genitourinary:  Negative for frequency and hematuria.  Musculoskeletal:  Negative for back pain, gait problem, joint swelling and neck pain.  Skin:  Positive for color change. Negative for rash.  Neurological:  Negative for dizziness, tremors, speech difficulty and weakness.  Psychiatric/Behavioral:  Negative for agitation, dysphoric mood and sleep disturbance. The patient is not nervous/anxious.     Objective:  BP (!) 148/76 (BP Location: Right Arm, Patient Position: Sitting, Cuff Size: Large)   Pulse 86   Temp 98.1 F (36.7 C) (Oral)   Ht 5\' 11"  (1.803 m)   Wt 299 lb (135.6 kg)   SpO2 95%   BMI  41.70 kg/m   BP Readings from Last 3 Encounters:  11/15/21 (!) 148/76  08/09/21 140/78  07/02/21 118/80    Wt Readings from Last 3 Encounters:  11/15/21 299 lb (135.6 kg)  08/09/21 (!) 304 lb (137.9 kg)  07/26/21 299 lb (135.6 kg)    Physical Exam Constitutional:      General: He is not in acute distress.    Appearance: He is well-developed.     Comments: NAD  Eyes:     Conjunctiva/sclera: Conjunctivae normal.     Pupils: Pupils are equal, round, and reactive to light.  Neck:     Thyroid: No thyromegaly.     Vascular: No JVD.  Cardiovascular:     Rate and Rhythm: Normal rate and regular rhythm.     Heart sounds: Normal heart sounds. No murmur heard.    No friction rub. No  gallop.  Pulmonary:     Effort: Pulmonary effort is normal. No respiratory distress.     Breath sounds: Normal breath sounds. No wheezing or rales.  Chest:     Chest wall: No tenderness.  Abdominal:     General: Bowel sounds are normal. There is no distension.     Palpations: Abdomen is soft. There is no mass.     Tenderness: There is no abdominal tenderness. There is no guarding or rebound.  Musculoskeletal:        General: No tenderness. Normal range of motion.     Cervical back: Normal range of motion.  Lymphadenopathy:     Cervical: No cervical adenopathy.  Skin:    General: Skin is warm and dry.     Findings: No rash.  Neurological:     Mental Status: He is alert and oriented to person, place, and time.     Cranial Nerves: No cranial nerve deficit.     Motor: No abnormal muscle tone.     Coordination: Coordination normal.     Gait: Gait normal.     Deep Tendon Reflexes: Reflexes are normal and symmetric.  Psychiatric:        Behavior: Behavior normal.        Thought Content: Thought content normal.        Judgment: Judgment normal.     Lab Results  Component Value Date   WBC 8.4 07/17/2020   HGB 15.0 07/17/2020   HCT 44.9 07/17/2020   PLT 204 07/17/2020   GLUCOSE 126 (H) 07/17/2020   CHOL 113 07/17/2020   TRIG 109 07/17/2020   HDL 35 (L) 07/17/2020   LDLDIRECT 149.9 03/12/2007   LDLCALC 59 07/17/2020   ALT 27 10/16/2020   AST 20 10/16/2020   NA 141 10/16/2020   K 3.6 10/16/2020   CL 106 10/16/2020   CREATININE 0.9 10/16/2020   BUN 15 10/16/2020   CO2 26 (A) 10/16/2020   TSH 1.77 12/20/2016   PSA 0.75 07/17/2020   PSA 0.75 07/17/2020   HGBA1C 6.7 10/16/2020   MICROALBUR 0.2 07/17/2020    DG Elbow Complete Right  Result Date: 07/01/2019 CLINICAL DATA:  Posterior right elbow pain and swelling and redness. Limited range of motion. EXAM: RIGHT ELBOW - COMPLETE 3+ VIEW COMPARISON:  None. FINDINGS: There is no fracture or dislocation or joint effusion. There  are slight arthritic changes of the elbow joint. Prominent enthesophyte at the triceps insertion. There is nonspecific subcutaneous edema seen posteriorly and medially at the left elbow. IMPRESSION: No acute osseous abnormality. Nonspecific subcutaneous edema. Electronically Signed   By: Fayrene Fearing  Maxwell M.D.   On: 07/01/2019 10:26    Assessment & Plan:   Problem List Items Addressed This Visit     Coronary artery disease    Cont w/Plavix, ASA, Coreg, Lipitor, Farxiga      DM2 (diabetes mellitus, type 2) (HCC)     Will increase Ozempic dose      Relevant Medications   Semaglutide, 2 MG/DOSE, 8 MG/3ML SOPN   Essential hypertension    Chronic Lotensin HCT, Coreg      Obesity    A1c 6.0%  Will increase Ozempic dose      Relevant Medications   Semaglutide, 2 MG/DOSE, 8 MG/3ML SOPN   Well adult exam    We discussed age appropriate health related issues, including available/recomended screening tests and vaccinations. Labs were ordered to be later reviewed . All questions were answered. We discussed one or more of the following - seat belt use, use of sunscreen/sun exposure exercise, fall risk reduction, second hand smoke exposure, firearm use and storage, seat belt use, a need for adhering to healthy diet and exercise. Labs were ordered.  All questions were answered. Colonoscopy is due in 2027 (last in 2017)         Meds ordered this encounter  Medications   Semaglutide, 2 MG/DOSE, 8 MG/3ML SOPN    Sig: Inject 2 mg as directed once a week.    Dispense:  3 mL    Refill:  11      Follow-up: Return in about 4 months (around 03/17/2022) for a follow-up visit.  Sonda Primes, MD

## 2021-11-15 NOTE — Assessment & Plan Note (Addendum)
A1c 6.0%  Will increase Ozempic dose

## 2021-11-15 NOTE — Assessment & Plan Note (Addendum)
Will increase Ozempic dose 

## 2021-11-15 NOTE — Assessment & Plan Note (Signed)
We discussed age appropriate health related issues, including available/recomended screening tests and vaccinations. Labs were ordered to be later reviewed . All questions were answered. We discussed one or more of the following - seat belt use, use of sunscreen/sun exposure exercise, fall risk reduction, second hand smoke exposure, firearm use and storage, seat belt use, a need for adhering to healthy diet and exercise. Labs were ordered.  All questions were answered. Colonoscopy is due in 2027 (last in 2017)

## 2021-11-15 NOTE — Assessment & Plan Note (Signed)
Cont w/Plavix, ASA, Coreg, Lipitor, Marcelline Deist

## 2021-11-15 NOTE — Assessment & Plan Note (Signed)
Chronic Lotensin HCT, Coreg 

## 2021-11-30 ENCOUNTER — Other Ambulatory Visit: Payer: Self-pay | Admitting: Internal Medicine

## 2022-01-10 ENCOUNTER — Telehealth: Payer: Self-pay | Admitting: Internal Medicine

## 2022-01-10 ENCOUNTER — Telehealth: Payer: Self-pay | Admitting: Cardiology

## 2022-01-10 NOTE — Telephone Encounter (Signed)
Pt came in wanted to know if Dr. Alain Marion has gotten a chance to fill out his paperwork for [facility] that he brought here to the office last Monday. Patient can be best reached at [phone]

## 2022-01-10 NOTE — Telephone Encounter (Signed)
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

## 2022-01-10 NOTE — Telephone Encounter (Signed)
Spoke with the patient and advised him that his form has been received but Dr. Radford Pax is not in the office so it has not been completed yet. Advised that I would call him back with an update once completed. Patient verbalized understanding.

## 2022-01-10 NOTE — Telephone Encounter (Signed)
MD completed form notified pt form ready for pick-up.Marland KitchenJohny Chess

## 2022-01-12 ENCOUNTER — Encounter: Payer: Self-pay | Admitting: Physician Assistant

## 2022-01-12 NOTE — Telephone Encounter (Signed)
Spoke with the patient who states that he has been feeling good. Denies any symptoms such as chest pain, shortness of breath, dizziness, palpitations.  Advised him that form would be completed by Melina Copa, PA-C and that it would be available for him to pick up today. Patient verbalized understanding.

## 2022-01-26 ENCOUNTER — Other Ambulatory Visit: Payer: Self-pay | Admitting: Internal Medicine

## 2022-02-21 ENCOUNTER — Telehealth: Payer: Self-pay | Admitting: Internal Medicine

## 2022-02-21 NOTE — Telephone Encounter (Signed)
Patient called and said that the pharmacy can't get the 65ml pen of Ozempic, they do have the 52ml pen so he wasn't sure if you wanted to drop it back or switch to a different medication. He did say it would need to be a 90d/s for insurance

## 2022-02-22 NOTE — Telephone Encounter (Signed)
No.. insurance will not cover 2 pens.Marland KitchenRaechel Chute

## 2022-02-22 NOTE — Telephone Encounter (Signed)
Will the insurance cover twice the amount of 1 mg of Ozempic pens to match a 2 mg pen supply?  Thanks

## 2022-02-25 NOTE — Telephone Encounter (Signed)
Noted.  Okay to drop back to 1 mg pens. Thx

## 2022-02-28 MED ORDER — SEMAGLUTIDE (1 MG/DOSE) 4 MG/3ML ~~LOC~~ SOPN: 1.0000 mg | PEN_INJECTOR | SUBCUTANEOUS | 5 refills | Status: DC

## 2022-02-28 NOTE — Telephone Encounter (Signed)
Called pt clarified if he is talikng the 2 mg. Pt  states no they are not able to get. Inform pt MD states ok to take the 1 mg sending rx to [facility]

## 2022-03-11 ENCOUNTER — Other Ambulatory Visit: Payer: Self-pay | Admitting: Internal Medicine

## 2022-03-14 ENCOUNTER — Other Ambulatory Visit: Payer: Self-pay | Admitting: Internal Medicine

## 2022-03-15 LAB — COMPLETE METABOLIC PANEL WITH GFR
AG Ratio: 2 (calc) (ref 1.0–2.5)
ALT: 24 U/L (ref 9–46)
AST: 16 U/L (ref 10–35)
Albumin: 4.1 g/dL (ref 3.6–5.1)
Alkaline phosphatase (APISO): 54 U/L (ref 35–144)
BUN: 21 mg/dL (ref 7–25)
CO2: 27 mmol/L (ref 20–32)
Calcium: 9.4 mg/dL (ref 8.6–10.3)
Chloride: 106 mmol/L (ref 98–110)
Creat: 0.9 mg/dL (ref 0.70–1.35)
Globulin: 2.1 g/dL (calc) (ref 1.9–3.7)
Glucose, Bld: 126 mg/dL — ABNORMAL HIGH (ref 65–99)
Potassium: 4.2 mmol/L (ref 3.5–5.3)
Sodium: 141 mmol/L (ref 135–146)
Total Bilirubin: 0.7 mg/dL (ref 0.2–1.2)
Total Protein: 6.2 g/dL (ref 6.1–8.1)
eGFR: 95 mL/min/{1.73_m2} (ref >=60)

## 2022-03-15 LAB — HEMOGLOBIN A1C W/OUT EAG: Hgb A1c MFr Bld: 6.6 % of total Hgb — ABNORMAL HIGH (ref <5.7)

## 2022-03-21 ENCOUNTER — Ambulatory Visit: Payer: BC Managed Care – PPO | Admitting: Internal Medicine

## 2022-03-21 ENCOUNTER — Encounter: Payer: Self-pay | Admitting: Internal Medicine

## 2022-03-21 VITALS — BP 138/82 | HR 67 | Temp 97.9°F | Ht 71.0 in | Wt 300.8 lb

## 2022-03-21 DIAGNOSIS — E1122 Type 2 diabetes mellitus with diabetic chronic kidney disease: Secondary | ICD-10-CM | POA: Diagnosis not present

## 2022-03-21 DIAGNOSIS — N181 Chronic kidney disease, stage 1: Secondary | ICD-10-CM | POA: Diagnosis not present

## 2022-03-21 DIAGNOSIS — Z794 Long term (current) use of insulin: Secondary | ICD-10-CM

## 2022-03-21 DIAGNOSIS — I2510 Atherosclerotic heart disease of native coronary artery without angina pectoris: Secondary | ICD-10-CM | POA: Diagnosis not present

## 2022-03-21 DIAGNOSIS — J351 Hypertrophy of tonsils: Secondary | ICD-10-CM | POA: Diagnosis not present

## 2022-03-21 MED ORDER — OZEMPIC (0.25 OR 0.5 MG/DOSE) 2 MG/3ML ~~LOC~~ SOPN: PEN_INJECTOR | SUBCUTANEOUS | 1 refills | Status: DC

## 2022-03-21 NOTE — Progress Notes (Signed)
Subjective:  Patient ID: Andrew Carson, male    DOB: 1956-01-07  Age: 66 y.o. MRN: [id number]  CC: Annual Exam (4 month f/u)   HPI Andrew Carson presents for DM, CAD, dyslipidemia Out of Ozempic  x 2 months  Outpatient Medications Prior to Visit  Medication Sig Dispense Refill   atorvastatin (LIPITOR) 80 MG tablet TAKE 1 TABLET BY MOUTH EVERY DAY 90 tablet 3   Bayer Microlet Lancets lancets Use to help check blood sugar twice a day 100 each 12   benazepril-hydrochlorthiazide (LOTENSIN HCT) 20-12.5 MG tablet TAKE 1 TABLET BY MOUTH EVERY DAY 90 tablet 3   carvedilol (COREG) 12.5 MG tablet TAKE 1 TABLET BY MOUTH TWICE A DAY WITH MEALS. PLEASE KEEP 07/02/21 APPT FOR FUTURE REFILLS. 180 tablet 3   celecoxib (CELEBREX) 200 MG capsule TAKE 1 CAPSULE (200 MG TOTAL) BY MOUTH DAILY AS NEEDED FOR MODERATE PAIN. 90 capsule 1   Cholecalciferol ([facility] D3) 25 MCG (1000 UT) capsule TAKE 1 CAPSULE BY MOUTH EVERY DAY 90 capsule 2   clopidogrel (PLAVIX) 75 MG tablet TAKE 1 TABLET BY MOUTH EVERY DAY. Please keep 07/02/21 appt for future refills. 90 tablet 3   diclofenac Sodium (VOLTAREN) 1 % GEL Apply 2 g topically 4 (four) times daily. 100 g 3   FARXIGA 10 MG TABS tablet TAKE 1 TABLET BY MOUTH EVERY DAY 90 tablet 3   ferrous sulfate 325 (65 FE) MG tablet TAKE 1 TABLET BY MOUTH EVERY DAY 90 tablet 2   glucose blood (CONTOUR NEXT TEST) test strip Use to check blood sugars twice day 100 each 12   JANUMET 50-1000 MG tablet TAKE 1 TABLET BY MOUTH TWICE A DAY WITH A MEAL 180 tablet 3   nitroGLYCERIN (NITROSTAT) 0.4 MG SL tablet Place 1 tablet (0.4 mg total) under the tongue every 5 (five) minutes as needed for chest pain. 25 tablet 6   pantoprazole (PROTONIX) 40 MG tablet Take 1 tablet (40 mg total) by mouth daily. 90 tablet 3   tamsulosin (FLOMAX) 0.4 MG CAPS capsule Take 0.4 mg by mouth daily.     aspirin EC 81 MG tablet Take 1 tablet (81 mg total) by mouth daily. 100 tablet 3   Semaglutide, 1 MG/DOSE, 4  MG/3ML SOPN Inject 1 mg as directed once a week. (Patient not taking: Reported on 03/21/2022) 3 mL 5   No facility-administered medications prior to visit.    ROS: Review of Systems  Constitutional:  Negative for appetite change, fatigue and unexpected weight change.  HENT:  Negative for congestion, nosebleeds, sneezing, sore throat and trouble swallowing.   Eyes:  Negative for itching and visual disturbance.  Respiratory:  Negative for cough.   Cardiovascular:  Negative for chest pain, palpitations and leg swelling.  Gastrointestinal:  Negative for abdominal distention, blood in stool, diarrhea and nausea.  Genitourinary:  Negative for frequency and hematuria.  Musculoskeletal:  Negative for back pain, gait problem, joint swelling and neck pain.  Skin:  Negative for rash.  Neurological:  Negative for dizziness, tremors, speech difficulty and weakness.  Psychiatric/Behavioral:  Negative for agitation, dysphoric mood and sleep disturbance. The patient is not nervous/anxious.     Objective:  BP 138/82 (BP Location: Left Arm)   Pulse 67   Temp 97.9 F (36.6 C) (Oral)   Ht 5\' 11"  (1.803 m)   Wt (!) 300 lb 12.8 oz (136.4 kg)   SpO2 97%   BMI 41.95 kg/m   BP Readings from Last 3  Encounters:  03/21/22 138/82  11/15/21 (!) 148/76  08/09/21 140/78    Wt Readings from Last 3 Encounters:  03/21/22 (!) 300 lb 12.8 oz (136.4 kg)  11/15/21 299 lb (135.6 kg)  08/09/21 (!) 304 lb (137.9 kg)    Physical Exam Constitutional:      General: He is not in acute distress.    Appearance: He is well-developed. He is obese.     Comments: NAD  Eyes:     Conjunctiva/sclera: Conjunctivae normal.     Pupils: Pupils are equal, round, and reactive to light.  Neck:     Thyroid: No thyromegaly.     Vascular: No JVD.  Cardiovascular:     Rate and Rhythm: Normal rate and regular rhythm.     Heart sounds: Normal heart sounds. No murmur heard.    No friction rub. No gallop.  Pulmonary:      Effort: Pulmonary effort is normal. No respiratory distress.     Breath sounds: Normal breath sounds. No wheezing or rales.  Chest:     Chest wall: No tenderness.  Abdominal:     General: Bowel sounds are normal. There is no distension.     Palpations: Abdomen is soft. There is no mass.     Tenderness: There is no abdominal tenderness. There is no guarding or rebound.  Musculoskeletal:        General: No tenderness. Normal range of motion.     Cervical back: Normal range of motion.  Lymphadenopathy:     Cervical: No cervical adenopathy.  Skin:    General: Skin is warm and dry.     Findings: No rash.  Neurological:     Mental Status: He is alert and oriented to person, place, and time.     Cranial Nerves: No cranial nerve deficit.     Motor: No abnormal muscle tone.     Coordination: Coordination normal.     Gait: Gait normal.     Deep Tendon Reflexes: Reflexes are normal and symmetric.  Psychiatric:        Behavior: Behavior normal.        Thought Content: Thought content normal.        Judgment: Judgment normal.   Enlarged tonsils R>L  Lab Results  Component Value Date   WBC 8.4 07/17/2020   HGB 15.0 07/17/2020   HCT 44.9 07/17/2020   PLT 204 07/17/2020   GLUCOSE 126 (H) 03/14/2022   CHOL 113 07/17/2020   TRIG 109 07/17/2020   HDL 35 (L) 07/17/2020   LDLDIRECT 149.9 03/12/2007   LDLCALC 59 07/17/2020   ALT 24 03/14/2022   AST 16 03/14/2022   NA 141 03/14/2022   K 4.2 03/14/2022   CL 106 03/14/2022   CREATININE 0.90 03/14/2022   BUN 21 03/14/2022   CO2 27 03/14/2022   TSH 1.77 12/20/2016   PSA 0.75 07/17/2020   PSA 0.75 07/17/2020   HGBA1C 6.6 (H) 03/14/2022   MICROALBUR 0.2 07/17/2020    DG Elbow Complete Right  Result Date: 07/01/2019 CLINICAL DATA:  Posterior right elbow pain and swelling and redness. Limited range of motion. EXAM: RIGHT ELBOW - COMPLETE 3+ VIEW COMPARISON:  None. FINDINGS: There is no fracture or dislocation or joint effusion. There are  slight arthritic changes of the elbow joint. Prominent enthesophyte at the triceps insertion. There is nonspecific subcutaneous edema seen posteriorly and medially at the left elbow. IMPRESSION: No acute osseous abnormality. Nonspecific subcutaneous edema. Electronically Signed   By: Fayrene Fearing  Maxwell M.D.   On: 07/01/2019 10:26    Assessment & Plan:   Problem List Items Addressed This Visit     Coronary artery disease    Cont w/Plavix, ASA, Coreg, Lipitor, Farxiga NTG prn Treat DM, HTN      DM2 (diabetes mellitus, type 2) (HCC) - Primary    Out of Ozempic  x 2 months Will re-start Ozempic at low dose      Relevant Medications   Semaglutide,0.25 or 0.5MG /DOS, (OZEMPIC, 0.25 OR 0.5 MG/DOSE,) 2 MG/3ML SOPN   Enlarged tonsils    Enlarged tonsils R>L, no change ENT ref was offered         Meds ordered this encounter  Medications   Semaglutide,0.25 or 0.5MG /DOS, (OZEMPIC, 0.25 OR 0.5 MG/DOSE,) 2 MG/3ML SOPN    Sig: Use 0.25 mg weekly sq for 1 month, then 0.5 mg sq weekly    Dispense:  9 mL    Refill:  1      Follow-up: Return in about 3 months (around 06/20/2022) for a follow-up visit.  Sonda Primes, MD

## 2022-03-21 NOTE — Assessment & Plan Note (Addendum)
Out of Ozempic  x 2 months Will re-start Ozempic at low dose

## 2022-03-21 NOTE — Assessment & Plan Note (Addendum)
Enlarged tonsils R>L, no change ENT ref was offered

## 2022-03-21 NOTE — Assessment & Plan Note (Signed)
Cont w/Plavix, ASA, Coreg, Lipitor, Farxiga NTG prn Treat DM, HTN

## 2022-04-03 ENCOUNTER — Ambulatory Visit
Admission: EM | Admit: 2022-04-03 | Discharge: 2022-04-03 | Disposition: A | Discharge status: Home / Self Care | Payer: BC Managed Care – PPO | Attending: Urgent Care | Admitting: Urgent Care

## 2022-04-03 DIAGNOSIS — E119 Type 2 diabetes mellitus without complications: Secondary | ICD-10-CM

## 2022-04-03 DIAGNOSIS — U071 COVID-19: Secondary | ICD-10-CM | POA: Diagnosis not present

## 2022-04-03 DIAGNOSIS — I10 Essential (primary) hypertension: Secondary | ICD-10-CM

## 2022-04-03 DIAGNOSIS — I519 Heart disease, unspecified: Secondary | ICD-10-CM | POA: Diagnosis not present

## 2022-04-03 DIAGNOSIS — I252 Old myocardial infarction: Secondary | ICD-10-CM | POA: Diagnosis not present

## 2022-04-03 MED ORDER — PAXLOVID (300/100) 20 X 150 MG & 10 X 100MG PO TBPK: ORAL_TABLET | ORAL | 0 refills | Status: DC

## 2022-04-03 MED ORDER — CETIRIZINE HCL 10 MG PO TABS: 10.0000 mg | ORAL_TABLET | Freq: Every day | ORAL | 0 refills | Status: DC

## 2022-04-03 MED ORDER — PROMETHAZINE-DM 6.25-15 MG/5ML PO SYRP: 2.5000 mL | ORAL_SOLUTION | Freq: Three times a day (TID) | ORAL | 0 refills | Status: DC | PRN

## 2022-04-03 MED ORDER — BENZONATATE 100 MG PO CAPS: 100.0000 mg | ORAL_CAPSULE | Freq: Three times a day (TID) | ORAL | 0 refills | Status: DC | PRN

## 2022-04-03 NOTE — Discharge Instructions (Addendum)
Do not take atorvastatin while on Paxlovid.

## 2022-04-03 NOTE — ED Triage Notes (Addendum)
Pt c/o flu sx x 3-4 days-pos covid home test yesterday-NAD-steady gait

## 2022-04-03 NOTE — ED Provider Notes (Signed)
[facility]  Note:  This document was prepared using Conservation officer, historic buildings and may include unintentional dictation errors.  MRN: [id number] DOB: 1956-01-14  Subjective:   Andrew Carson is a 66 y.o. male presenting for 3-4 day history of acute onset fever, body aches, coughing, chest tightness. Had a COVID test turn positive at home yesterday. No asthma. No shob, wheezing. Has DM, HTN, CAD. No smoking.   No current facility-administered medications for this encounter.  Current Outpatient Medications:    aspirin EC 81 MG tablet, Take 1 tablet (81 mg total) by mouth daily., Disp: 100 tablet, Rfl: 3   atorvastatin (LIPITOR) 80 MG tablet, TAKE 1 TABLET BY MOUTH EVERY DAY, Disp: 90 tablet, Rfl: 3   Bayer Microlet Lancets lancets, Use to help check blood sugar twice a day, Disp: 100 each, Rfl: 12   benazepril-hydrochlorthiazide (LOTENSIN HCT) 20-12.5 MG tablet, TAKE 1 TABLET BY MOUTH EVERY DAY, Disp: 90 tablet, Rfl: 3   carvedilol (COREG) 12.5 MG tablet, TAKE 1 TABLET BY MOUTH TWICE A DAY WITH MEALS. PLEASE KEEP 07/02/21 APPT FOR FUTURE REFILLS., Disp: 180 tablet, Rfl: 3   celecoxib (CELEBREX) 200 MG capsule, TAKE 1 CAPSULE (200 MG TOTAL) BY MOUTH DAILY AS NEEDED FOR MODERATE PAIN., Disp: 90 capsule, Rfl: 1   Cholecalciferol (CVS D3) 25 MCG (1000 UT) capsule, TAKE 1 CAPSULE BY MOUTH EVERY DAY, Disp: 90 capsule, Rfl: 2   clopidogrel (PLAVIX) 75 MG tablet, TAKE 1 TABLET BY MOUTH EVERY DAY. Please keep 07/02/21 appt for future refills., Disp: 90 tablet, Rfl: 3   diclofenac Sodium (VOLTAREN) 1 % GEL, Apply 2 g topically 4 (four) times daily., Disp: 100 g, Rfl: 3   FARXIGA 10 MG TABS tablet, TAKE 1 TABLET BY MOUTH EVERY DAY, Disp: 90 tablet, Rfl: 3   ferrous sulfate 325 (65 FE) MG tablet, TAKE 1 TABLET BY MOUTH EVERY DAY, Disp: 90 tablet, Rfl: 2   glucose blood (CONTOUR NEXT TEST) test strip, Use to check blood sugars twice day, Disp: 100 each, Rfl: 12   JANUMET  50-1000 MG tablet, TAKE 1 TABLET BY MOUTH TWICE A DAY WITH A MEAL, Disp: 180 tablet, Rfl: 3   nitroGLYCERIN (NITROSTAT) 0.4 MG SL tablet, Place 1 tablet (0.4 mg total) under the tongue every 5 (five) minutes as needed for chest pain., Disp: 25 tablet, Rfl: 6   pantoprazole (PROTONIX) 40 MG tablet, Take 1 tablet (40 mg total) by mouth daily., Disp: 90 tablet, Rfl: 3   Semaglutide,0.25 or 0.5MG /DOS, (OZEMPIC, 0.25 OR 0.5 MG/DOSE,) 2 MG/3ML SOPN, Use 0.25 mg weekly sq for 1 month, then 0.5 mg sq weekly, Disp: 9 mL, Rfl: 1   tamsulosin (FLOMAX) 0.4 MG CAPS capsule, Take 0.4 mg by mouth daily., Disp: , Rfl:    Allergies  Allergen Reactions   Triamcinolone Dermatitis    redness, rash    Past Medical History:  Diagnosis Date   Anemia 08/11/2017   Colon  2017 Dr Leone Payor Start iron 2019 GI ref if Hgb not better   Coronary artery disease 08/07/2018   a. CAD with NSTEMI 07/2018 in [place] s/p DES to LAD with residual RCA/LCx disease.   Diabetes mellitus 2011   type 2   Dyslipidemia (high LDL; low HDL) 10/15/2010   Elevated PSA 09/06/2013   5/15 due to UTI Dr Annabell Howells    Essential hypertension 02/23/2007   Finger pain, right 02/11/2011   10/12 R 4th prox phal - likely a partial flexor tendon  rupture    GERD 03/12/2007    On Omeprazole   Hypogonadism male    KNEE PAIN 03/12/2007   11/19 L    Obesity    OSTEOARTHRITIS 03/12/2007   Chronic      Phimosis 01/29/2016   10/17 candida due to [place] - tolerable   URTICARIA 06/05/2009   Qualifier: Diagnosis of  By: Plotnikov MD, Georgina Quint    UTI (urinary tract infection) 09/06/2013   5/15 dr Annabell Howells    Venous insufficiency 08/19/2011   B LE mild R>L    Vitamin D deficiency 03/14/2007   Chronic        Past Surgical History:  Procedure Laterality Date   LUMBAR LAMINECTOMY  07/1995   L5-S1   WISDOM TOOTH EXTRACTION      Family History  Problem Relation Age of Onset   COPD Mother    Heart disease Mother 4       MI   Heart disease Father  37       MI   Kidney disease Other    Colon cancer Neg Hx    Esophageal cancer Neg Hx    Rectal cancer Neg Hx    Stomach cancer Neg Hx     Social History   Tobacco Use   Smoking status: Never    Passive exposure: Never   Smokeless tobacco: Never  Vaping Use   Vaping Use: Never used  Substance Use Topics   Alcohol use: No   Drug use: No    ROS   Objective:   Vitals: BP (!) 160/98 (BP Location: Left Arm)   Pulse 75   Temp 98.5 F (36.9 C) (Oral)   Resp 18   SpO2 97%   Physical Exam Constitutional:      General: He is not in acute distress.    Appearance: Normal appearance. He is well-developed. He is not ill-appearing, toxic-appearing or diaphoretic.  HENT:     Head: Normocephalic and atraumatic.     Right Ear: External ear normal.     Left Ear: External ear normal.     Nose: Congestion present. No rhinorrhea.     Mouth/Throat:     Mouth: Mucous membranes are moist.     Pharynx: No oropharyngeal exudate or posterior oropharyngeal erythema.  Eyes:     General: No scleral icterus.       Right eye: No discharge.        Left eye: No discharge.     Extraocular Movements: Extraocular movements intact.  Cardiovascular:     Rate and Rhythm: Normal rate and regular rhythm.     Heart sounds: Normal heart sounds. No murmur heard.    No friction rub. No gallop.  Pulmonary:     Effort: Pulmonary effort is normal. No respiratory distress.     Breath sounds: Normal breath sounds. No stridor. No wheezing, rhonchi or rales.  Neurological:     Mental Status: He is alert and oriented to person, place, and time.  Psychiatric:        Mood and Affect: Mood normal.        Behavior: Behavior normal.        Thought Content: Thought content normal.     Assessment and Plan :   PDMP not reviewed this encounter.  1. Clinical diagnosis of COVID-19   2. Type 2 diabetes mellitus treated without insulin (HCC)   3. Heart disease   4. History of myocardial infarction   5.  Essential hypertension  Last GFR > 60 this month. Start Paxlovid given positive COVID test at home. Will manage COVID-19 with supportive care. Counseled patient on nature of COVID-19 including modes of transmission, diagnostic testing, management and supportive care.  Offered symptomatic relief. Deferred imaging given clear cardiopulmonary exam, hemodynamically stable vital signs. Counseled patient on potential for adverse effects with medications prescribed/recommended today, strict ER and return-to-clinic precautions discussed, patient verbalized understanding.     Wallis Bamberg, [place] 04/04/22 [phone]

## 2022-06-07 ENCOUNTER — Other Ambulatory Visit: Payer: Self-pay | Admitting: Internal Medicine

## 2022-06-20 ENCOUNTER — Encounter: Payer: Self-pay | Admitting: Internal Medicine

## 2022-06-20 ENCOUNTER — Ambulatory Visit: Payer: BC Managed Care – PPO | Admitting: Internal Medicine

## 2022-06-20 VITALS — BP 120/70 | Temp 97.9°F | Ht 71.0 in | Wt 305.0 lb

## 2022-06-20 DIAGNOSIS — E785 Hyperlipidemia, unspecified: Secondary | ICD-10-CM | POA: Diagnosis not present

## 2022-06-20 DIAGNOSIS — N181 Chronic kidney disease, stage 1: Secondary | ICD-10-CM

## 2022-06-20 DIAGNOSIS — I2510 Atherosclerotic heart disease of native coronary artery without angina pectoris: Secondary | ICD-10-CM | POA: Diagnosis not present

## 2022-06-20 DIAGNOSIS — Z794 Long term (current) use of insulin: Secondary | ICD-10-CM

## 2022-06-20 DIAGNOSIS — I10 Essential (primary) hypertension: Secondary | ICD-10-CM | POA: Diagnosis not present

## 2022-06-20 DIAGNOSIS — E1122 Type 2 diabetes mellitus with diabetic chronic kidney disease: Secondary | ICD-10-CM | POA: Diagnosis not present

## 2022-06-20 MED ORDER — OZEMPIC (1 MG/DOSE) 2 MG/1.5ML ~~LOC~~ SOPN: 1.0000 mg | PEN_INJECTOR | SUBCUTANEOUS | 3 refills | Status: DC

## 2022-06-20 MED ORDER — ASPIRIN 81 MG PO TBEC: 81.0000 mg | DELAYED_RELEASE_TABLET | Freq: Every day | ORAL | 3 refills | Status: AC

## 2022-06-20 NOTE — Progress Notes (Signed)
Subjective:  Patient ID: Andrew Carson, male    DOB: 1955/11/11  Age: 67 y.o. MRN: [id number]  CC: Follow-up (3 MNTH FOLLOW UP)   HPI Andrew Carson presents for CAD, DM, HTN Has not have labs done yet  Outpatient Medications Prior to Visit  Medication Sig Dispense Refill   atorvastatin (LIPITOR) 80 MG tablet TAKE 1 TABLET BY MOUTH EVERY DAY 90 tablet 3   Bayer Microlet Lancets lancets Use to help check blood sugar twice a day 100 each 12   benazepril-hydrochlorthiazide (LOTENSIN HCT) 20-12.5 MG tablet TAKE 1 TABLET BY MOUTH EVERY DAY 90 tablet 3   carvedilol (COREG) 12.5 MG tablet TAKE 1 TABLET BY MOUTH TWICE A DAY WITH MEALS. PLEASE KEEP 07/02/21 APPT FOR FUTURE REFILLS. 180 tablet 3   celecoxib (CELEBREX) 200 MG capsule TAKE 1 CAPSULE (200 MG TOTAL) BY MOUTH DAILY AS NEEDED FOR MODERATE PAIN. 90 capsule 1   Cholecalciferol (CVS D3) 25 MCG (1000 UT) capsule TAKE 1 CAPSULE BY MOUTH EVERY DAY 90 capsule 2   clopidogrel (PLAVIX) 75 MG tablet TAKE 1 TABLET BY MOUTH EVERY DAY. Please keep 07/02/21 appt for future refills. 90 tablet 3   diclofenac Sodium (VOLTAREN) 1 % GEL Apply 2 g topically 4 (four) times daily. 100 g 3   FARXIGA 10 MG TABS tablet TAKE 1 TABLET BY MOUTH EVERY DAY 90 tablet 3   ferrous sulfate 325 (65 FE) MG tablet TAKE 1 TABLET BY MOUTH EVERY DAY 90 tablet 2   glucose blood (CONTOUR NEXT TEST) test strip Use to check blood sugars twice day 100 each 12   nitroGLYCERIN (NITROSTAT) 0.4 MG SL tablet Place 1 tablet (0.4 mg total) under the tongue every 5 (five) minutes as needed for chest pain. 25 tablet 6   pantoprazole (PROTONIX) 40 MG tablet Take 1 tablet (40 mg total) by mouth daily. 90 tablet 3   sitaGLIPtin-metformin (JANUMET) 50-1000 MG tablet TAKE 1 TABLET BY MOUTH TWICE A DAY WITH MEALS 180 tablet 1   tamsulosin (FLOMAX) 0.4 MG CAPS capsule Take 0.4 mg by mouth daily.     Semaglutide,0.25 or 0.'5MG'$ /DOS, (OZEMPIC, 0.25 OR 0.5 MG/DOSE,) 2 MG/3ML SOPN Use 0.25 mg weekly  sq for 1 month, then 0.5 mg sq weekly 9 mL 1   aspirin EC 81 MG tablet Take 1 tablet (81 mg total) by mouth daily. 100 tablet 3   benzonatate (TESSALON) 100 MG capsule Take 1 capsule (100 mg total) by mouth 3 (three) times daily as needed for cough. 30 capsule 0   cetirizine (ZYRTEC ALLERGY) 10 MG tablet Take 1 tablet (10 mg total) by mouth daily. 30 tablet 0   nirmatrelvir & ritonavir (PAXLOVID, 300/100,) 20 x 150 MG & 10 x '100MG'$  TBPK Take 2 tablets nirmtrelvir and 1 tablet ritonavir twice daily. 30 tablet 0   promethazine-dextromethorphan (PROMETHAZINE-DM) 6.25-15 MG/5ML syrup Take 2.5 mLs by mouth 3 (three) times daily as needed for cough. 100 mL 0   No facility-administered medications prior to visit.    ROS: Review of Systems  Constitutional:  Positive for unexpected weight change. Negative for appetite change and fatigue.  HENT:  Negative for congestion, nosebleeds, sneezing, sore throat and trouble swallowing.   Eyes:  Negative for itching and visual disturbance.  Respiratory:  Negative for cough.   Cardiovascular:  Negative for chest pain, palpitations and leg swelling.  Gastrointestinal:  Negative for abdominal distention, blood in stool, diarrhea and nausea.  Genitourinary:  Negative for frequency and hematuria.  Musculoskeletal:  Negative for back pain, gait problem, joint swelling and neck pain.  Skin:  Negative for rash.  Neurological:  Negative for dizziness, tremors, speech difficulty and weakness.  Psychiatric/Behavioral:  Negative for agitation, dysphoric mood and sleep disturbance. The patient is not nervous/anxious.     Objective:  BP 120/70 (BP Location: Right Arm, Patient Position: Sitting, Cuff Size: Normal)   Temp 97.9 F (36.6 C) (Oral)   Ht '5\' 11"'$  (1.803 m)   Wt (!) 305 lb (138.3 kg)   BMI 42.54 kg/m   BP Readings from Last 3 Encounters:  06/20/22 120/70  04/03/22 (!) 160/98  03/21/22 138/82    Wt Readings from Last 3 Encounters:  06/20/22 (!) 305 lb  (138.3 kg)  03/21/22 (!) 300 lb 12.8 oz (136.4 kg)  11/15/21 299 lb (135.6 kg)    Physical Exam Constitutional:      General: He is not in acute distress.    Appearance: He is well-developed. He is obese.     Comments: NAD  Eyes:     Conjunctiva/sclera: Conjunctivae normal.     Pupils: Pupils are equal, round, and reactive to light.  Neck:     Thyroid: No thyromegaly.     Vascular: No JVD.  Cardiovascular:     Rate and Rhythm: Normal rate and regular rhythm.     Heart sounds: Normal heart sounds. No murmur heard.    No friction rub. No gallop.  Pulmonary:     Effort: Pulmonary effort is normal. No respiratory distress.     Breath sounds: Normal breath sounds. No wheezing or rales.  Chest:     Chest wall: No tenderness.  Abdominal:     General: Bowel sounds are normal. There is no distension.     Palpations: Abdomen is soft. There is no mass.     Tenderness: There is no abdominal tenderness. There is no guarding or rebound.  Musculoskeletal:        General: No tenderness. Normal range of motion.     Cervical back: Normal range of motion.  Lymphadenopathy:     Cervical: No cervical adenopathy.  Skin:    General: Skin is warm and dry.     Findings: No rash.  Neurological:     Mental Status: He is alert and oriented to person, place, and time.     Cranial Nerves: No cranial nerve deficit.     Motor: No abnormal muscle tone.     Coordination: Coordination normal.     Gait: Gait normal.     Deep Tendon Reflexes: Reflexes are normal and symmetric.  Psychiatric:        Behavior: Behavior normal.        Thought Content: Thought content normal.        Judgment: Judgment normal.     Lab Results  Component Value Date   WBC 8.4 07/17/2020   HGB 15.0 07/17/2020   HCT 44.9 07/17/2020   PLT 204 07/17/2020   GLUCOSE 126 (H) 03/14/2022   CHOL 113 07/17/2020   TRIG 109 07/17/2020   HDL 35 (L) 07/17/2020   LDLDIRECT 149.9 03/12/2007   LDLCALC 59 07/17/2020   ALT 24  03/14/2022   AST 16 03/14/2022   NA 141 03/14/2022   K 4.2 03/14/2022   CL 106 03/14/2022   CREATININE 0.90 03/14/2022   BUN 21 03/14/2022   CO2 27 03/14/2022   TSH 1.77 12/20/2016   PSA 0.75 07/17/2020   PSA 0.75 07/17/2020   HGBA1C  6.6 (H) 03/14/2022   MICROALBUR 0.2 07/17/2020    No results found.  Assessment & Plan:   Problem List Items Addressed This Visit       Cardiovascular and Mediastinum   Essential hypertension - Primary    Chronic Lotensin HCT, Coreg      Relevant Medications   aspirin EC 81 MG tablet   Coronary artery disease    Cont w/Plavix, ASA, Coreg, Lipitor, Farxiga, Ozempic NTG prn Treat DM,HTN      Relevant Medications   aspirin EC 81 MG tablet     Endocrine   DM2 (diabetes mellitus, type 2) (HCC)    Will increase Ozempic dose      Relevant Medications   Semaglutide, 1 MG/DOSE, (OZEMPIC, 1 MG/DOSE,) 2 MG/1.5ML SOPN   aspirin EC 81 MG tablet     Other   Dyslipidemia (high LDL; low HDL)    On Lipitor         Meds ordered this encounter  Medications   Semaglutide, 1 MG/DOSE, (OZEMPIC, 1 MG/DOSE,) 2 MG/1.5ML SOPN    Sig: Inject 1 mg into the skin once a week. Use 1 mg sq weekly. Can titrate up to 2 mg weekly if tolerated    Dispense:  9 mL    Refill:  3   aspirin EC 81 MG tablet    Sig: Take 1 tablet (81 mg total) by mouth daily.    Dispense:  100 tablet    Refill:  3      Follow-up: Return in about 3 months (around 09/20/2022) for a follow-up visit.  Walker Kehr, MD

## 2022-06-20 NOTE — Assessment & Plan Note (Signed)
Cont w/Plavix, ASA, Coreg, Lipitor, Farxiga, Ozempic NTG prn Treat DM,HTN

## 2022-06-20 NOTE — Assessment & Plan Note (Signed)
Chronic Lotensin HCT, Coreg

## 2022-06-20 NOTE — Assessment & Plan Note (Signed)
On Lipitor 

## 2022-06-20 NOTE — Assessment & Plan Note (Signed)
Will increase Ozempic dose

## 2022-07-21 ENCOUNTER — Other Ambulatory Visit: Payer: Self-pay | Admitting: Internal Medicine

## 2022-07-27 ENCOUNTER — Other Ambulatory Visit: Payer: Self-pay | Admitting: Cardiology

## 2022-07-27 ENCOUNTER — Other Ambulatory Visit: Payer: Self-pay | Admitting: Internal Medicine

## 2022-08-23 ENCOUNTER — Other Ambulatory Visit: Payer: Self-pay | Admitting: Cardiology

## 2022-09-03 ENCOUNTER — Other Ambulatory Visit: Payer: Self-pay | Admitting: Internal Medicine

## 2022-09-04 ENCOUNTER — Other Ambulatory Visit: Payer: Self-pay | Admitting: Internal Medicine

## 2022-09-08 ENCOUNTER — Other Ambulatory Visit: Payer: Self-pay | Admitting: Internal Medicine

## 2022-09-17 ENCOUNTER — Other Ambulatory Visit: Payer: Self-pay | Admitting: Cardiology

## 2022-09-19 ENCOUNTER — Other Ambulatory Visit: Payer: Self-pay | Admitting: Internal Medicine

## 2022-09-19 ENCOUNTER — Telehealth: Payer: Self-pay | Admitting: Internal Medicine

## 2022-09-19 NOTE — Telephone Encounter (Signed)
[facility] called and said the patient was there for blood work with a written order. They said they were unable to read what the orders are and would like to know if they can be faxed to [phone]. Best callback for them is [phone].

## 2022-09-19 NOTE — Telephone Encounter (Signed)
Patient called to check on the status of the request from [facility]. Best callback is [phone].

## 2022-09-20 LAB — COMPLETE METABOLIC PANEL WITH GFR
AG Ratio: 1.7 (calc) (ref 1.0–2.5)
ALT: 19 U/L (ref 9–46)
AST: 13 U/L (ref 10–35)
Albumin: 3.9 g/dL (ref 3.6–5.1)
Alkaline phosphatase (APISO): 56 U/L (ref 35–144)
BUN: 17 mg/dL (ref 7–25)
CO2: 26 mmol/L (ref 20–32)
Calcium: 9.2 mg/dL (ref 8.6–10.3)
Chloride: 107 mmol/L (ref 98–110)
Creat: 0.82 mg/dL (ref 0.70–1.35)
Globulin: 2.3 g/dL (calc) (ref 1.9–3.7)
Glucose, Bld: 126 mg/dL — ABNORMAL HIGH (ref 65–99)
Potassium: 3.9 mmol/L (ref 3.5–5.3)
Sodium: 143 mmol/L (ref 135–146)
Total Bilirubin: 0.4 mg/dL (ref 0.2–1.2)
Total Protein: 6.2 g/dL (ref 6.1–8.1)
eGFR: 97 mL/min/{1.73_m2} (ref >=60)

## 2022-09-20 LAB — LIPID PANEL
Cholesterol: 113 mg/dL (ref <200)
HDL: 39 mg/dL — ABNORMAL LOW (ref >=40)
LDL Cholesterol (Calc): 53 mg/dL (calc)
Non-HDL Cholesterol (Calc): 74 mg/dL (calc) (ref <130)
Total CHOL/HDL Ratio: 2.9 (calc) (ref <5.0)
Triglycerides: 124 mg/dL (ref <150)

## 2022-09-20 LAB — HEMOGLOBIN A1C W/OUT EAG: Hgb A1c MFr Bld: 6.5 % of total Hgb — ABNORMAL HIGH (ref <5.7)

## 2022-09-20 NOTE — Telephone Encounter (Signed)
Called pt he states he ended up coming back to get order. [facility] was able to do labs, Pt will see MD on Monday 09/26/22.Marland KitchenRaechel Chute

## 2022-09-26 ENCOUNTER — Ambulatory Visit: Payer: BC Managed Care – PPO | Admitting: Internal Medicine

## 2022-09-26 ENCOUNTER — Encounter: Payer: Self-pay | Admitting: Internal Medicine

## 2022-09-26 VITALS — BP 122/80 | HR 85 | Temp 98.6°F | Ht 71.0 in | Wt 304.0 lb

## 2022-09-26 DIAGNOSIS — E559 Vitamin D deficiency, unspecified: Secondary | ICD-10-CM

## 2022-09-26 DIAGNOSIS — I10 Essential (primary) hypertension: Secondary | ICD-10-CM

## 2022-09-26 DIAGNOSIS — I2510 Atherosclerotic heart disease of native coronary artery without angina pectoris: Secondary | ICD-10-CM

## 2022-09-26 DIAGNOSIS — Z794 Long term (current) use of insulin: Secondary | ICD-10-CM | POA: Diagnosis not present

## 2022-09-26 DIAGNOSIS — M25562 Pain in left knee: Secondary | ICD-10-CM | POA: Diagnosis not present

## 2022-09-26 DIAGNOSIS — N181 Chronic kidney disease, stage 1: Secondary | ICD-10-CM | POA: Diagnosis not present

## 2022-09-26 DIAGNOSIS — E1122 Type 2 diabetes mellitus with diabetic chronic kidney disease: Secondary | ICD-10-CM | POA: Diagnosis not present

## 2022-09-26 DIAGNOSIS — E785 Hyperlipidemia, unspecified: Secondary | ICD-10-CM | POA: Diagnosis not present

## 2022-09-26 MED ORDER — CARVEDILOL 12.5 MG PO TABS: ORAL_TABLET | ORAL | 3 refills | Status: DC

## 2022-09-26 MED ORDER — ATORVASTATIN CALCIUM 80 MG PO TABS: ORAL_TABLET | ORAL | 3 refills | Status: DC

## 2022-09-26 MED ORDER — METHYLPREDNISOLONE ACETATE 40 MG/ML IJ SUSP
40.0000 mg | Freq: Once | INTRAMUSCULAR | Status: AC
Start: 2022-09-26 — End: 2022-09-26
  Administered 2022-09-26: 40 mg via INTRAMUSCULAR

## 2022-09-26 MED ORDER — LIDOCAINE-EPINEPHRINE 2 %-1:200000 IJ SOLN
3.0000 mL | Freq: Once | INTRAMUSCULAR | Status: AC
Start: 2022-09-26 — End: 2022-09-26
  Administered 2022-09-26: 3 mL

## 2022-09-26 NOTE — Assessment & Plan Note (Signed)
Cont w/Plavix, ASA, Coreg, Lipitor, Farxiga, Ozempic NTG prn Treat DM,HTN 

## 2022-09-26 NOTE — Assessment & Plan Note (Signed)
On Lipitor 

## 2022-09-26 NOTE — Progress Notes (Signed)
Subjective:  Patient ID: Andrew Carson, male    DOB: 11/27/1955  Age: 67 y.o. MRN: [id number]  CC: Follow-up (3 MNTH F/U)   HPI [name] presents for DM, OA, HTN C/o L knee pain  Outpatient Medications Prior to Visit  Medication Sig Dispense Refill   aspirin EC 81 MG tablet Take 1 tablet (81 mg total) by mouth daily. 100 tablet 3   Bayer Microlet Lancets lancets Use to help check blood sugar twice a day 100 each 12   benazepril-hydrochlorthiazide (LOTENSIN HCT) 20-12.5 MG tablet Take 1 tablet by mouth daily. Annual appt due in August must see provider for future refills 90 tablet 0   celecoxib (CELEBREX) 200 MG capsule Take 1 capsule (200 mg total) by mouth daily as needed for moderate pain. Annual appt due in Aug must see provider for future refills 90 capsule 0   Cholecalciferol (CVS D3) 25 MCG (1000 UT) capsule TAKE 1 CAPSULE BY MOUTH EVERY DAY 90 capsule 3   clopidogrel (PLAVIX) 75 MG tablet TAKE 1 TABLET BY MOUTH EVERY DAY. PLEASE KEEP 07/02/21 APPT FOR FUTURE REFILLS. 90 tablet 3   diclofenac Sodium (VOLTAREN) 1 % GEL Apply 2 g topically 4 (four) times daily. 100 g 3   FARXIGA 10 MG TABS tablet TAKE 1 TABLET BY MOUTH EVERY DAY 90 tablet 3   ferrous sulfate 325 (65 FE) MG tablet TAKE 1 TABLET BY MOUTH EVERY DAY 90 tablet 2   glucose blood (CONTOUR NEXT TEST) test strip Use to check blood sugars twice day 100 each 12   nitroGLYCERIN (NITROSTAT) 0.4 MG SL tablet Place 1 tablet (0.4 mg total) under the tongue every 5 (five) minutes as needed for chest pain. 25 tablet 6   pantoprazole (PROTONIX) 40 MG tablet Take 1 tablet (40 mg total) by mouth daily. 90 tablet 3   Semaglutide, 1 MG/DOSE, (OZEMPIC, 1 MG/DOSE,) 2 MG/1.5ML SOPN Inject 1 mg into the skin once a week. Use 1 mg sq weekly. Can titrate up to 2 mg weekly if tolerated 9 mL 3   sitaGLIPtin-metformin (JANUMET) 50-1000 MG tablet TAKE 1 TABLET BY MOUTH TWICE A DAY WITH MEALS 180 tablet 1   tamsulosin (FLOMAX) 0.4 MG CAPS  capsule Take 0.4 mg by mouth daily.     atorvastatin (LIPITOR) 80 MG tablet TAKE 1 TABLET (80 MG TOTAL) BY MOUTH DAILY. PT NEEDS A YEARLY 15 tablet 0   carvedilol (COREG) 12.5 MG tablet TAKE 1 TABLET BY MOUTH TWICE A DAY WITH FOOD 30 tablet 0   No facility-administered medications prior to visit.    ROS: Review of Systems  Constitutional:  Negative for appetite change, fatigue and unexpected weight change.  HENT:  Negative for congestion, nosebleeds, sneezing, sore throat and trouble swallowing.   Eyes:  Negative for itching and visual disturbance.  Respiratory:  Negative for cough.   Cardiovascular:  Negative for chest pain, palpitations and leg swelling.  Gastrointestinal:  Negative for abdominal distention, blood in stool, diarrhea and nausea.  Genitourinary:  Negative for frequency and hematuria.  Musculoskeletal:  Positive for arthralgias. Negative for back pain, gait problem, joint swelling and neck pain.  Skin:  Negative for rash.  Neurological:  Negative for dizziness, tremors, speech difficulty and weakness.  Psychiatric/Behavioral:  Negative for agitation, dysphoric mood and sleep disturbance. The patient is not nervous/anxious.     Objective:  BP 122/80 (BP Location: Right Arm, Patient Position: Sitting, Cuff Size: Large)   Pulse 85   Temp 98.6  F (37 C) (Oral)   Ht 5\' 11"  (1.803 m)   Wt (!) 304 lb (137.9 kg)   SpO2 97%   BMI 42.40 kg/m   BP Readings from Last 3 Encounters:  09/26/22 122/80  06/20/22 120/70  04/03/22 (!) 160/98    Wt Readings from Last 3 Encounters:  09/26/22 (!) 304 lb (137.9 kg)  06/20/22 (!) 305 lb (138.3 kg)  03/21/22 (!) 300 lb 12.8 oz (136.4 kg)    Physical Exam Constitutional:      General: He is not in acute distress.    Appearance: He is well-developed. He is obese.     Comments: NAD  Eyes:     Conjunctiva/sclera: Conjunctivae normal.     Pupils: Pupils are equal, round, and reactive to light.  Neck:     Thyroid: No  thyromegaly.     Vascular: No JVD.  Cardiovascular:     Rate and Rhythm: Normal rate and regular rhythm.     Heart sounds: Normal heart sounds. No murmur heard.    No friction rub. No gallop.  Pulmonary:     Effort: Pulmonary effort is normal. No respiratory distress.     Breath sounds: Normal breath sounds. No wheezing or rales.  Chest:     Chest wall: No tenderness.  Abdominal:     General: Bowel sounds are normal. There is no distension.     Palpations: Abdomen is soft. There is no mass.     Tenderness: There is no abdominal tenderness. There is no guarding or rebound.  Musculoskeletal:        General: No tenderness. Normal range of motion.     Cervical back: Normal range of motion.  Lymphadenopathy:     Cervical: No cervical adenopathy.  Skin:    General: Skin is warm and dry.     Findings: No rash.  Neurological:     Mental Status: He is alert and oriented to person, place, and time.     Cranial Nerves: No cranial nerve deficit.     Motor: No abnormal muscle tone.     Coordination: Coordination normal.     Gait: Gait normal.     Deep Tendon Reflexes: Reflexes are normal and symmetric.  Psychiatric:        Behavior: Behavior normal.        Thought Content: Thought content normal.        Judgment: Judgment normal.   L knee w/pain   Procedure Note :     Procedure : Joint Injection,  L knee   Indication:  Joint osteoarthritis with refractory  chronic pain.   Risks including unsuccessful procedure , bleeding, infection, bruising, skin atrophy, "steroid flare-up" and others were explained to the patient in detail as well as the benefits. Informed consent was obtained verbally.  Tthe patient was placed in a comfortable position. Lateral approach was used. Skin was prepped with Betadine and alcohol  and anesthetized a cooling spray. Then, a 5 cc syringe with a 1.5 inch long 25-gauge needle was used for a joint injection.. The needle was advanced  Into the knee joint cavity.  I aspirated a small amount of intra-articular fluid to confirm correct placement of the needle and injected the joint with 5 mL of 2% lidocaine and 40 mg of Depo-Medrol .  Band-Aid was applied.   Tolerated well. Complications: None. Good pain relief following the procedure.   Postprocedure instructions :    A Band-Aid should be left on for 12 hours.  Injection therapy is not a cure itself. It is used in conjunction with other modalities. You can use nonsteroidal anti-inflammatories like ibuprofen , hot and cold compresses. Rest is recommended in the next 24 hours. You need to report immediately  if fever, chills or any signs of infection develop.   Lab Results  Component Value Date   WBC 8.4 07/17/2020   HGB 15.0 07/17/2020   HCT 44.9 07/17/2020   PLT 204 07/17/2020   GLUCOSE 126 (H) 09/19/2022   CHOL 113 09/19/2022   TRIG 124 09/19/2022   HDL 39 (L) 09/19/2022   LDLDIRECT 149.9 03/12/2007   LDLCALC 53 09/19/2022   ALT 19 09/19/2022   AST 13 09/19/2022   NA 143 09/19/2022   K 3.9 09/19/2022   CL 107 09/19/2022   CREATININE 0.82 09/19/2022   BUN 17 09/19/2022   CO2 26 09/19/2022   TSH 1.77 12/20/2016   PSA 0.75 07/17/2020   PSA 0.75 07/17/2020   HGBA1C 6.5 (H) 09/19/2022   MICROALBUR 0.2 07/17/2020    No results found.  Assessment & Plan:   Problem List Items Addressed This Visit     DM2 (diabetes mellitus, type 2) (HCC) - Primary    Will increase Ozempic dose      Relevant Medications   atorvastatin (LIPITOR) 80 MG tablet   Vitamin D deficiency    On Vit D      Essential hypertension    Chronic Lotensin HCT, Coreg      Relevant Medications   atorvastatin (LIPITOR) 80 MG tablet   carvedilol (COREG) 12.5 MG tablet   Dyslipidemia (high LDL; low HDL)    On Lipitor      Relevant Medications   atorvastatin (LIPITOR) 80 MG tablet   Coronary artery disease    Cont w/Plavix, ASA, Coreg, Lipitor, Farxiga, Ozempic NTG prn Treat DM,HTN      Relevant  Medications   atorvastatin (LIPITOR) 80 MG tablet   carvedilol (COREG) 12.5 MG tablet      Meds ordered this encounter  Medications   atorvastatin (LIPITOR) 80 MG tablet    Sig: TAKE 1 TABLET (80 MG TOTAL) BY MOUTH DAILY    Dispense:  90 tablet    Refill:  3   carvedilol (COREG) 12.5 MG tablet    Sig: TAKE 1 TABLET BY MOUTH TWICE A DAY WITH FOODTAKE 1 TABLET BY MOUTH TWICE A DAY WITH FOOD    Dispense:  180 tablet    Refill:  3      Follow-up: No follow-ups on file.  Sonda Primes, MD

## 2022-09-26 NOTE — Patient Instructions (Signed)
Postprocedure instructions :    A Band-Aid should be left on for 12 hours. Injection therapy is not a cure itself. It is used in conjunction with other modalities. You can use nonsteroidal anti-inflammatories like ibuprofen , hot and cold compresses. Rest is recommended in the next 24 hours. You need to report immediately  if fever, chills or any signs of infection develop. 

## 2022-09-26 NOTE — Assessment & Plan Note (Signed)
On Vit D 

## 2022-09-26 NOTE — Assessment & Plan Note (Signed)
Will increase Ozempic dose 

## 2022-09-26 NOTE — Assessment & Plan Note (Signed)
Chronic Lotensin HCT, Coreg 

## 2022-09-26 NOTE — Addendum Note (Signed)
Addended by: Delsa Grana R on: 09/26/2022 11:45 AM   Modules accepted: Orders

## 2022-10-12 ENCOUNTER — Other Ambulatory Visit: Payer: Self-pay | Admitting: Internal Medicine

## 2022-10-26 ENCOUNTER — Other Ambulatory Visit: Payer: Self-pay | Admitting: Internal Medicine

## 2022-11-20 NOTE — Progress Notes (Signed)
Office Visit    Patient Name: Andrew Carson Date of Encounter: 11/20/2022  Primary Care Provider:  Tresa Garter, MD Primary Cardiologist:  Armanda Magic, MD Primary Electrophysiologist: None   Past Medical History    Past Medical History:  Diagnosis Date   Anemia 08/11/2017   Colon  2017 Dr Leone Payor Start iron 2019 GI ref if Hgb not better   Coronary artery disease 08/07/2018   a. CAD with NSTEMI 07/2018 in [place] s/p DES to LAD with residual RCA/LCx disease.   Diabetes mellitus 2011   type 2   Dyslipidemia (high LDL; low HDL) 10/15/2010   Elevated PSA 09/06/2013   5/15 due to UTI Dr Annabell Howells    Essential hypertension 02/23/2007   Finger pain, right 02/11/2011   10/12 R 4th prox phal - likely a partial flexor tendon rupture    GERD 03/12/2007    On Omeprazole   Hypogonadism male    KNEE PAIN 03/12/2007   11/19 L    Obesity    OSTEOARTHRITIS 03/12/2007   Chronic      Phimosis 01/29/2016   10/17 candida due to [place] - tolerable   URTICARIA 06/05/2009   Qualifier: Diagnosis of  By: Plotnikov MD, Georgina Quint    UTI (urinary tract infection) 09/06/2013   5/15 dr Annabell Howells    Venous insufficiency 08/19/2011   B LE mild R>L    Vitamin D deficiency 03/14/2007   Chronic      Past Surgical History:  Procedure Laterality Date   LUMBAR LAMINECTOMY  07/1995   L5-S1   WISDOM TOOTH EXTRACTION      Allergies  Allergies  Allergen Reactions   Triamcinolone Dermatitis    redness, rash     History of Present Illness    Andrew Carson  is a 67 year old male with a PMH of CAD s/p NSTEMI 07/2018 PCI/DES to 90% LAD with residual RCA/left circumflex disease, HTN, HLD, GERD, DM type II, osteoarthritis and obesity who presents today for 1 year follow-up.  Mr. Antczak was seen initially in 08/2018 by referral of PCP for complaint of chest pain.  He suffered an NSTEMI while in [place] driving for [employer] with subsequent LHC showing 90% LAD that was treated with DES x 1 with  residual disease in RCA/left circumflex. 2D echo 07/2018 showed EF 50-55% low normal, anteroseptal hypokinesis, trace MR.  He was most recently seen on 07/02/2021 by Ronie Spies, PA for follow-up.  And patient reported doing well with no cardiac complaints. He underwent a ETT that was abnormal and completed a Myoview for DOT clearance that was normal showing no evidence of ischemia but did show reduced EF of 45 as 50%.  Since last being seen in the office patient reports no significant changes from previous visit.  His blood pressure was initially elevated at 132/90 and was 132/84 on recheck.  He reports compliance with his current medication regimen and denies any adverse reactions.  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  He is off Sunday and Monday and works Tuesday through Saturday.  He reports consuming foods high in salt due to lack of healthy choices at truck stops and other facilities.  We discussed the need to add some form of physical activity as tolerated secondary prevention.  He is in need of a DOT physical and clearance will be determined based on results of 2D echo.  He is euvolemic on exam with trace  lower extremity edema bilaterally.  Patient denies chest pain, palpitations, dyspnea, PND, orthopnea, nausea, vomiting, dizziness, syncope, edema, weight gain, or early satiety.   Home Medications    Current Outpatient Medications  Medication Sig Dispense Refill   aspirin EC 81 MG tablet Take 1 tablet (81 mg total) by mouth daily. 100 tablet 3   atorvastatin (LIPITOR) 80 MG tablet TAKE 1 TABLET (80 MG TOTAL) BY MOUTH DAILY 90 tablet 3   Bayer Microlet Lancets lancets Use to help check blood sugar twice a day 100 each 12   benazepril-hydrochlorthiazide (LOTENSIN HCT) 20-12.5 MG tablet Take 1 tablet by mouth daily. Annual appt due in August must see provider for future refills 90 tablet 0   carvedilol (COREG) 12.5 MG tablet TAKE 1 TABLET BY MOUTH TWICE A  DAY WITH FOODTAKE 1 TABLET BY MOUTH TWICE A DAY WITH FOOD 180 tablet 3   celecoxib (CELEBREX) 200 MG capsule Take 1 capsule (200 mg total) by mouth daily as needed for moderate pain. Annual appt due in Aug must see provider for future refills 90 capsule 0   Cholecalciferol ([facility] D3) 25 MCG (1000 UT) capsule TAKE 1 CAPSULE BY MOUTH EVERY DAY 90 capsule 3   clopidogrel (PLAVIX) 75 MG tablet TAKE 1 TABLET BY MOUTH EVERY DAY. PLEASE KEEP 07/02/21 APPT FOR FUTURE REFILLS. 90 tablet 3   diclofenac Sodium (VOLTAREN) 1 % GEL Apply 2 g topically 4 (four) times daily. 100 g 3   FARXIGA 10 MG TABS tablet TAKE 1 TABLET BY MOUTH EVERY DAY 90 tablet 3   ferrous sulfate 325 (65 FE) MG tablet TAKE 1 TABLET BY MOUTH EVERY DAY 90 tablet 1   glucose blood (CONTOUR NEXT TEST) test strip Use to check blood sugars twice day 100 each 12   nitroGLYCERIN (NITROSTAT) 0.4 MG SL tablet Place 1 tablet (0.4 mg total) under the tongue every 5 (five) minutes as needed for chest pain. 25 tablet 6   pantoprazole (PROTONIX) 40 MG tablet TAKE 1 TABLET BY MOUTH EVERY DAY 90 tablet 3   Semaglutide, 1 MG/DOSE, (OZEMPIC, 1 MG/DOSE,) 2 MG/1.5ML SOPN Inject 1 mg into the skin once a week. Use 1 mg sq weekly. Can titrate up to 2 mg weekly if tolerated 9 mL 3   sitaGLIPtin-metformin (JANUMET) 50-1000 MG tablet TAKE 1 TABLET BY MOUTH TWICE A DAY WITH MEALS 180 tablet 1   tamsulosin (FLOMAX) 0.4 MG CAPS capsule Take 0.4 mg by mouth daily.     No current facility-administered medications for this visit.     Review of Systems  Please see the history of present illness.    (+) Trace lower extremity edema (+) Weight gain  All other systems reviewed and are otherwise negative except as noted above.  Physical Exam    Wt Readings from Last 3 Encounters:  09/26/22 (!) 304 lb (137.9 kg)  06/20/22 (!) 305 lb (138.3 kg)  03/21/22 (!) 300 lb 12.8 oz (136.4 kg)   VF:IEPPI were no vitals filed for this visit.,There is no height or weight on file  to calculate BMI.  Constitutional:      Appearance: Healthy appearance. Not in distress.  Neck:     Vascular: JVD normal.  Pulmonary:     Effort: Pulmonary effort is normal.     Breath sounds: No wheezing. No rales. Diminished in the bases Cardiovascular:     Normal rate. Regular rhythm. Normal S1. Normal S2.      Murmurs: There is no murmur.  Edema:  Peripheral edema absent.  Abdominal:     Palpations: Abdomen is soft non tender. There is no hepatomegaly.  Skin:    General: Skin is warm and dry.  Neurological:     General: No focal deficit present.     Mental Status: Alert and oriented to person, place and time.     Cranial Nerves: Cranial nerves are intact.  EKG/LABS/ Recent Cardiac Studies    ECG personally reviewed by me today -sinus rhythm with rate of no acute changes consistent with previous EKG. Lab Results  Component Value Date   WBC 8.4 07/17/2020   HGB 15.0 07/17/2020   HCT 44.9 07/17/2020   MCV 86.8 07/17/2020   PLT 204 07/17/2020   Lab Results  Component Value Date   CREATININE 0.82 09/19/2022   BUN 17 09/19/2022   NA 143 09/19/2022   K 3.9 09/19/2022   CL 107 09/19/2022   CO2 26 09/19/2022   Lab Results  Component Value Date   ALT 19 09/19/2022   AST 13 09/19/2022   ALKPHOS 52 10/16/2020   BILITOT 0.4 09/19/2022   Lab Results  Component Value Date   CHOL 113 09/19/2022   HDL 39 (L) 09/19/2022   LDLCALC 53 09/19/2022   LDLDIRECT 149.9 03/12/2007   TRIG 124 09/19/2022   CHOLHDL 2.9 09/19/2022    Lab Results  Component Value Date   HGBA1C 6.5 (H) 09/19/2022     Assessment & Plan    1.  Coronary artery disease: -s/p NSTEMI 2020 with DES to LAD with residual RCA/left circumflex disease.  Most recent Myoview completed 07/2021 showing no ischemia and low risk. -Today patient reports no recurrent angina or chest discomfort. -He is currently very sedentary and today we discussed measures to increase compliance with physical  activity. -Patient requires DOT clearance and previous Myoview results were normal but revealed reduced EF. -Patient will complete 2D echo to evaluate LV function. -Patient is aware that if LV function is reduced he will require additional testing for clearance. -Continue GDMT with ASA 81 mg, Plavix 75 mg, Lipitor 80 mg, carvedilol 12.5 mg twice daily  2.  Essential hypertension: -Patient's blood pressure today was initially elevated at 132/90 and was 132/84 on recheck -Patient advised to continue carvedilol 12.5 mg Lotensin HCT 20-12.5 mg  3.  Hyperlipidemia: -Patient's most recent LDL cholesterol was controlled at 53 -Continue Lipitor 80 mg daily  4.  DM type II: -Patient's hemoglobin A1c was 6.5% -Continue Ozempic, Farxiga, Janumet  Disposition: Follow-up with Armanda Magic, MD or APP in 12 months   Medication Adjustments/Labs and Tests Ordered: Current medicines are reviewed at length with the patient today.  Concerns regarding medicines are outlined above.   Signed, Napoleon Form, Leodis Rains, NP 11/20/2022, 12:32 PM [facility]

## 2022-11-21 ENCOUNTER — Encounter: Payer: Self-pay | Admitting: Nurse Practitioner

## 2022-11-21 ENCOUNTER — Ambulatory Visit: Payer: BC Managed Care – PPO | Attending: Nurse Practitioner | Admitting: Nurse Practitioner

## 2022-11-21 VITALS — BP 132/90 | HR 88 | Ht 71.0 in | Wt 304.2 lb

## 2022-11-21 DIAGNOSIS — N181 Chronic kidney disease, stage 1: Secondary | ICD-10-CM

## 2022-11-21 DIAGNOSIS — E1122 Type 2 diabetes mellitus with diabetic chronic kidney disease: Secondary | ICD-10-CM | POA: Diagnosis not present

## 2022-11-21 DIAGNOSIS — E785 Hyperlipidemia, unspecified: Secondary | ICD-10-CM | POA: Diagnosis not present

## 2022-11-21 DIAGNOSIS — I2510 Atherosclerotic heart disease of native coronary artery without angina pectoris: Secondary | ICD-10-CM

## 2022-11-21 DIAGNOSIS — I10 Essential (primary) hypertension: Secondary | ICD-10-CM | POA: Diagnosis not present

## 2022-11-21 DIAGNOSIS — Z794 Long term (current) use of insulin: Secondary | ICD-10-CM

## 2022-11-21 MED ORDER — NITROGLYCERIN 0.4 MG SL SUBL: 0.4000 mg | SUBLINGUAL_TABLET | SUBLINGUAL | 3 refills | Status: AC | PRN

## 2022-11-21 NOTE — Patient Instructions (Addendum)
Medication Instructions:  Your physician recommends that you continue on your current medications as directed. Please refer to the Current Medication list given to you today. *If you need a refill on your cardiac medications before your next appointment, please call your pharmacy*   Lab Work: None Ordered   Testing/Procedures: Your physician has requested that you have an echocardiogram. Echocardiography is a painless test that uses sound waves to create images of your heart. It provides your doctor with information about the size and shape of your heart and how well your heart's chambers and valves are working. This procedure takes approximately one hour. There are no restrictions for this procedure. Please do NOT wear cologne, perfume, aftershave, or lotions (deodorant is allowed). Please arrive 15 minutes prior to your appointment time. Please scheduled before October    Follow-Up: At [facility], you and your health needs are our priority.  As part of our continuing mission to provide you with exceptional heart care, we have created designated Provider Care Teams.  These Care Teams include your primary Cardiologist (physician) and Advanced Practice Providers (APPs -  Physician Assistants and Nurse Practitioners) who all work together to provide you with the care you need, when you need it.  We recommend signing up for the patient portal called "MyChart".  Sign up information is provided on this After Visit Summary.  MyChart is used to connect with patients for Virtual Visits (Telemedicine).  Patients are able to view lab/test results, encounter notes, upcoming appointments, etc.  Non-urgent messages can be sent to your provider as well.   To learn more about what you can do with MyChart, go to [email or web address].    Your next appointment:   12 month(s)  Provider:   Armanda Magic, MD     Other Instructions Check your blood pressure at home

## 2022-12-02 ENCOUNTER — Other Ambulatory Visit: Payer: Self-pay | Admitting: Internal Medicine

## 2022-12-03 ENCOUNTER — Other Ambulatory Visit: Payer: Self-pay | Admitting: Internal Medicine

## 2022-12-19 ENCOUNTER — Ambulatory Visit (HOSPITAL_COMMUNITY): Payer: BC Managed Care – PPO | Attending: Internal Medicine

## 2022-12-19 DIAGNOSIS — N181 Chronic kidney disease, stage 1: Secondary | ICD-10-CM | POA: Diagnosis present

## 2022-12-19 DIAGNOSIS — E785 Hyperlipidemia, unspecified: Secondary | ICD-10-CM | POA: Insufficient documentation

## 2022-12-19 DIAGNOSIS — I2510 Atherosclerotic heart disease of native coronary artery without angina pectoris: Secondary | ICD-10-CM | POA: Insufficient documentation

## 2022-12-19 DIAGNOSIS — I10 Essential (primary) hypertension: Secondary | ICD-10-CM | POA: Diagnosis present

## 2022-12-19 DIAGNOSIS — Z794 Long term (current) use of insulin: Secondary | ICD-10-CM | POA: Diagnosis present

## 2022-12-19 DIAGNOSIS — E1122 Type 2 diabetes mellitus with diabetic chronic kidney disease: Secondary | ICD-10-CM | POA: Insufficient documentation

## 2022-12-19 LAB — ECHOCARDIOGRAM COMPLETE
Area-P 1/2: 3.77 cm2
S' Lateral: 4 cm

## 2022-12-20 ENCOUNTER — Telehealth: Payer: Self-pay | Admitting: Nurse Practitioner

## 2022-12-20 NOTE — Telephone Encounter (Signed)
Good morning, pt dropped off Cardiac Condition Check List to be filled out. It will be in your mailbox. Thank you!

## 2022-12-22 NOTE — Telephone Encounter (Signed)
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 Advised a call back with any questions or concerns.

## 2023-03-01 ENCOUNTER — Other Ambulatory Visit: Payer: Self-pay | Admitting: Internal Medicine

## 2023-03-20 ENCOUNTER — Other Ambulatory Visit: Payer: Self-pay | Admitting: Internal Medicine

## 2023-03-21 LAB — CBC WITH DIFFERENTIAL/PLATELET
Absolute Lymphocytes: 1564 {cells}/uL (ref 850–3900)
Absolute Monocytes: 1049 {cells}/uL — ABNORMAL HIGH (ref 200–950)
Basophils Absolute: 50 {cells}/uL (ref 0–200)
Basophils Relative: 0.5 %
Eosinophils Absolute: 267 {cells}/uL (ref 15–500)
Eosinophils Relative: 2.7 %
HCT: 45.3 % (ref 38.5–50.0)
Hemoglobin: 14.8 g/dL (ref 13.2–17.1)
MCH: 28.5 pg (ref 27.0–33.0)
MCHC: 32.7 g/dL (ref 32.0–36.0)
MCV: 87.1 fL (ref 80.0–100.0)
MPV: 9 fL (ref 7.5–12.5)
Monocytes Relative: 10.6 %
Neutro Abs: 6970 {cells}/uL (ref 1500–7800)
Neutrophils Relative %: 70.4 %
Platelets: 234 10*3/uL (ref 140–400)
RBC: 5.2 10*6/uL (ref 4.20–5.80)
RDW: 13.2 % (ref 11.0–15.0)
Total Lymphocyte: 15.8 %
WBC: 9.9 10*3/uL (ref 3.8–10.8)

## 2023-03-21 LAB — COMPLETE METABOLIC PANEL WITH GFR
AG Ratio: 2 (calc) (ref 1.0–2.5)
ALT: 21 U/L (ref 9–46)
AST: 13 U/L (ref 10–35)
Albumin: 4.1 g/dL (ref 3.6–5.1)
Alkaline phosphatase (APISO): 63 U/L (ref 35–144)
BUN: 11 mg/dL (ref 7–25)
CO2: 28 mmol/L (ref 20–32)
Calcium: 9.2 mg/dL (ref 8.6–10.3)
Chloride: 105 mmol/L (ref 98–110)
Creat: 0.84 mg/dL (ref 0.70–1.35)
Globulin: 2.1 g/dL (ref 1.9–3.7)
Glucose, Bld: 135 mg/dL — ABNORMAL HIGH (ref 65–99)
Potassium: 4.1 mmol/L (ref 3.5–5.3)
Sodium: 142 mmol/L (ref 135–146)
Total Bilirubin: 0.8 mg/dL (ref 0.2–1.2)
Total Protein: 6.2 g/dL (ref 6.1–8.1)
eGFR: 96 mL/min/{1.73_m2} (ref >=60)

## 2023-03-21 LAB — LIPID PANEL
Cholesterol: 109 mg/dL (ref <200)
HDL: 37 mg/dL — ABNORMAL LOW (ref >=40)
LDL Cholesterol (Calc): 52 mg/dL
Non-HDL Cholesterol (Calc): 72 mg/dL (ref <130)
Total CHOL/HDL Ratio: 2.9 (calc) (ref <5.0)
Triglycerides: 110 mg/dL (ref <150)

## 2023-03-21 LAB — TSH: TSH: 1.56 m[IU]/L (ref 0.40–4.50)

## 2023-03-21 LAB — MICROALBUMIN / CREATININE URINE RATIO
Creatinine, Urine: 98 mg/dL (ref 20–320)
Microalb Creat Ratio: 4 mg/g{creat} (ref <30)
Microalb, Ur: 0.4 mg/dL

## 2023-03-21 LAB — HEMOGLOBIN A1C W/OUT EAG: Hgb A1c MFr Bld: 6.6 %{Hb} — ABNORMAL HIGH (ref <5.7)

## 2023-03-27 ENCOUNTER — Ambulatory Visit: Payer: BC Managed Care – PPO | Admitting: Internal Medicine

## 2023-03-27 ENCOUNTER — Encounter: Payer: Self-pay | Admitting: Internal Medicine

## 2023-03-27 VITALS — BP 130/80 | HR 77 | Temp 98.6°F | Ht 71.0 in | Wt 302.0 lb

## 2023-03-27 DIAGNOSIS — Z794 Long term (current) use of insulin: Secondary | ICD-10-CM | POA: Diagnosis not present

## 2023-03-27 DIAGNOSIS — I2510 Atherosclerotic heart disease of native coronary artery without angina pectoris: Secondary | ICD-10-CM

## 2023-03-27 DIAGNOSIS — M6530 Trigger finger, unspecified finger: Secondary | ICD-10-CM | POA: Diagnosis not present

## 2023-03-27 DIAGNOSIS — E1122 Type 2 diabetes mellitus with diabetic chronic kidney disease: Secondary | ICD-10-CM | POA: Diagnosis not present

## 2023-03-27 DIAGNOSIS — N181 Chronic kidney disease, stage 1: Secondary | ICD-10-CM

## 2023-03-27 MED ORDER — SITAGLIPTIN PHOS-METFORMIN HCL 50-1000 MG PO TABS: 1.0000 | ORAL_TABLET | Freq: Two times a day (BID) | ORAL | 3 refills | Status: DC

## 2023-03-27 MED ORDER — SEMAGLUTIDE (2 MG/DOSE) 8 MG/3ML ~~LOC~~ SOPN: 2.0000 mg | PEN_INJECTOR | SUBCUTANEOUS | 3 refills | Status: DC

## 2023-03-27 MED ORDER — METHYLPREDNISOLONE 4 MG PO TBPK: ORAL_TABLET | ORAL | 0 refills | Status: DC

## 2023-03-27 NOTE — Progress Notes (Signed)
Subjective:  Patient ID: Andrew Carson, male    DOB: 1955-07-25  Age: 67 y.o. MRN: [id number]  CC: Medical Management of Chronic Issues (6 MNTH F/U)   HPI Andrew Carson presents for DM, HTN, CAD C/o L 5th finger w/pain, triggering some R>L x 2 month  Outpatient Medications Prior to Visit  Medication Sig Dispense Refill   aspirin EC 81 MG tablet Take 1 tablet (81 mg total) by mouth daily. 100 tablet 3   atorvastatin (LIPITOR) 80 MG tablet TAKE 1 TABLET (80 MG TOTAL) BY MOUTH DAILY 90 tablet 3   Bayer Microlet Lancets lancets Use to help check blood sugar twice a day 100 each 12   benazepril-hydrochlorthiazide (LOTENSIN HCT) 20-12.5 MG tablet TAKE 1 TABLET BY MOUTH DAILY. ANNUAL APPT DUE IN AUGUST MUST SEE PROVIDER FOR FUTURE REFILLS 90 tablet 3   carvedilol (COREG) 12.5 MG tablet TAKE 1 TABLET BY MOUTH TWICE A DAY WITH FOODTAKE 1 TABLET BY MOUTH TWICE A DAY WITH FOOD 180 tablet 3   celecoxib (CELEBREX) 200 MG capsule TAKE 1 CAPSULE BY MOUTH EVERY DAY AS NEEDED FOR MODERATE PAIN 90 capsule 0   Cholecalciferol ([facility] D3) 25 MCG (1000 UT) capsule TAKE 1 CAPSULE BY MOUTH EVERY DAY 90 capsule 3   clopidogrel (PLAVIX) 75 MG tablet TAKE 1 TABLET BY MOUTH EVERY DAY. PLEASE KEEP 07/02/21 APPT FOR FUTURE REFILLS. 90 tablet 3   dapagliflozin propanediol (FARXIGA) 10 MG TABS tablet TAKE 1 TABLET BY MOUTH EVERY DAY 90 tablet 1   diclofenac Sodium (VOLTAREN) 1 % GEL Apply 2 g topically 4 (four) times daily. 100 g 3   ferrous sulfate 325 (65 FE) MG tablet TAKE 1 TABLET BY MOUTH EVERY DAY 90 tablet 1   glucose blood (CONTOUR NEXT TEST) test strip Use to check blood sugars twice day 100 each 12   nitroGLYCERIN (NITROSTAT) 0.4 MG SL tablet Place 1 tablet (0.4 mg total) under the tongue every 5 (five) minutes as needed for chest pain. 25 tablet 3   pantoprazole (PROTONIX) 40 MG tablet TAKE 1 TABLET BY MOUTH EVERY DAY 90 tablet 3   tamsulosin (FLOMAX) 0.4 MG CAPS capsule Take 0.4 mg by mouth daily.      JANUMET 50-1000 MG tablet TAKE 1 TABLET BY MOUTH TWICE A DAY WITH FOOD 180 tablet 1   Semaglutide, 1 MG/DOSE, (OZEMPIC, 1 MG/DOSE,) 2 MG/1.5ML SOPN Inject 1 mg into the skin once a week. Use 1 mg sq weekly. Can titrate up to 2 mg weekly if tolerated 9 mL 3   No facility-administered medications prior to visit.    ROS: Review of Systems  Constitutional:  Negative for appetite change, fatigue and unexpected weight change.  HENT:  Negative for congestion, nosebleeds, sneezing, sore throat and trouble swallowing.   Eyes:  Negative for itching and visual disturbance.  Respiratory:  Negative for cough.   Cardiovascular:  Negative for chest pain, palpitations and leg swelling.  Gastrointestinal:  Negative for abdominal distention, blood in stool, diarrhea and nausea.  Genitourinary:  Negative for frequency and hematuria.  Musculoskeletal:  Positive for arthralgias. Negative for back pain, gait problem, joint swelling and neck pain.  Skin:  Negative for rash.  Neurological:  Negative for dizziness, tremors, speech difficulty and weakness.  Psychiatric/Behavioral:  Negative for agitation, dysphoric mood, sleep disturbance and suicidal ideas. The patient is not nervous/anxious.     Objective:  BP 130/80 (BP Location: Left Arm, Patient Position: Sitting, Cuff Size: Normal)   Pulse 77  Temp 98.6 F (37 C) (Oral)   Ht 5\' 11"  (1.803 m)   Wt (!) 302 lb (137 kg)   SpO2 96%   BMI 42.12 kg/m   BP Readings from Last 3 Encounters:  03/27/23 130/80  11/21/22 (!) 132/90  09/26/22 122/80    Wt Readings from Last 3 Encounters:  03/27/23 (!) 302 lb (137 kg)  11/21/22 (!) 304 lb 3.2 oz (138 kg)  09/26/22 (!) 304 lb (137.9 kg)    Physical Exam Constitutional:      General: He is not in acute distress.    Appearance: He is well-developed. He is obese.     Comments: NAD  Eyes:     Conjunctiva/sclera: Conjunctivae normal.     Pupils: Pupils are equal, round, and reactive to light.  Neck:      Thyroid: No thyromegaly.     Vascular: No JVD.  Cardiovascular:     Rate and Rhythm: Normal rate and regular rhythm.     Heart sounds: Normal heart sounds. No murmur heard.    No friction rub. No gallop.  Pulmonary:     Effort: Pulmonary effort is normal. No respiratory distress.     Breath sounds: Normal breath sounds. No wheezing or rales.  Chest:     Chest wall: No tenderness.  Abdominal:     General: Bowel sounds are normal. There is no distension.     Palpations: Abdomen is soft. There is no mass.     Tenderness: There is no abdominal tenderness. There is no guarding or rebound.  Musculoskeletal:        General: No tenderness. Normal range of motion.     Cervical back: Normal range of motion.  Lymphadenopathy:     Cervical: No cervical adenopathy.  Skin:    General: Skin is warm and dry.     Findings: No rash.  Neurological:     Mental Status: He is alert and oriented to person, place, and time.     Cranial Nerves: No cranial nerve deficit.     Motor: No abnormal muscle tone.     Coordination: Coordination normal.     Gait: Gait normal.     Deep Tendon Reflexes: Reflexes are normal and symmetric.  Psychiatric:        Behavior: Behavior normal.        Thought Content: Thought content normal.        Judgment: Judgment normal.     Lab Results  Component Value Date   WBC 9.9 03/20/2023   HGB 14.8 03/20/2023   HCT 45.3 03/20/2023   PLT 234 03/20/2023   GLUCOSE 135 (H) 03/20/2023   CHOL 109 03/20/2023   TRIG 110 03/20/2023   HDL 37 (L) 03/20/2023   LDLDIRECT 149.9 03/12/2007   LDLCALC 52 03/20/2023   ALT 21 03/20/2023   AST 13 03/20/2023   NA 142 03/20/2023   K 4.1 03/20/2023   CL 105 03/20/2023   CREATININE 0.84 03/20/2023   BUN 11 03/20/2023   CO2 28 03/20/2023   TSH 1.56 03/20/2023   PSA 0.75 07/17/2020   PSA 0.75 07/17/2020   HGBA1C 6.6 (H) 03/20/2023   MICROALBUR 0.4 03/20/2023    No results found.  Assessment & Plan:   Problem List Items  Addressed This Visit     DM2 (diabetes mellitus, type 2) (HCC) - Primary   Will increase Ozempic dose to 2 mg/wk      Relevant Medications   sitaGLIPtin-metformin (JANUMET) 50-1000 MG tablet  Semaglutide, 2 MG/DOSE, 8 MG/3ML SOPN   Other Relevant Orders   Comprehensive metabolic panel   Hemoglobin A1c   Coronary artery disease   Cont w/Plavix, ASA, Coreg, Lipitor, Farxiga, Ozempic NTG prn Treat DM,HTN      Trigger finger    Worse -  L, R 5th finger w/pain, triggering some R>L x 2 month Medrol pack         Meds ordered this encounter  Medications   sitaGLIPtin-metformin (JANUMET) 50-1000 MG tablet    Sig: Take 1 tablet by mouth 2 (two) times daily with a meal.    Dispense:  180 tablet    Refill:  3   Semaglutide, 2 MG/DOSE, 8 MG/3ML SOPN    Sig: Inject 2 mg as directed once a week.    Dispense:  9 mL    Refill:  3   DISCONTD: methylPREDNISolone (MEDROL DOSEPAK) 4 MG TBPK tablet    Sig: As directed    Dispense:  21 tablet    Refill:  0   methylPREDNISolone (MEDROL DOSEPAK) 4 MG TBPK tablet    Sig: As directed    Dispense:  21 tablet    Refill:  0      Follow-up: Return in about 3 months (around 06/25/2023) for a follow-up visit.  Sonda Primes, MD

## 2023-03-27 NOTE — Assessment & Plan Note (Addendum)
Will increase Ozempic dose to 2 mg/wk

## 2023-03-27 NOTE — Assessment & Plan Note (Addendum)
Worse -  L, R 5th finger w/pain, triggering some R>L x 2 month Medrol pack

## 2023-03-27 NOTE — Assessment & Plan Note (Signed)
Cont w/Plavix, ASA, Coreg, Lipitor, Farxiga, Ozempic NTG prn Treat DM,HTN 

## 2023-05-29 ENCOUNTER — Other Ambulatory Visit: Payer: Self-pay | Admitting: Internal Medicine

## 2023-05-31 ENCOUNTER — Telehealth: Payer: Self-pay | Admitting: Internal Medicine

## 2023-05-31 DIAGNOSIS — E1122 Type 2 diabetes mellitus with diabetic chronic kidney disease: Secondary | ICD-10-CM

## 2023-05-31 MED ORDER — DEXCOM G7 RECEIVER DEVI: 1 refills | Status: AC

## 2023-05-31 MED ORDER — DEXCOM G7 SENSOR MISC: 11 refills | Status: AC

## 2023-05-31 NOTE — Telephone Encounter (Signed)
1. I would prefer if he stays on Ozempic due to Ozempic cardiovascular benefit. 2.  Dexcom 7 is usually covered for insulin users only. Thanks

## 2023-05-31 NOTE — Telephone Encounter (Signed)
Dexcom G7 has been sent in to pts pharmacy. Dr. Posey Rea has stated "I would prefer if he stays on Ozempic due to Ozempic cardiovascular benefit."   I was also able to call and inform the pt about the above.

## 2023-05-31 NOTE — Telephone Encounter (Signed)
Copied from CRM [id number]. Topic: Clinical - Medication Question >> May 31, 2023 10:50 AM Prudencio Pair wrote: Reason for CRM: Cathrine Muster, RN, with [facility], called in wanting to know if Dr. Posey Rea would switch patient's Ozempic to [facility]? She states he was taking it in the past & his insurance plan wouldn't cover it but now they do. She also wants to know if he a Dexcom G7 sensor can be ordered for patient to check his blood sugar. States pt's wife is on it & he wants to try it out. Almira Coaster states pt's plan does cover it & if [facility] can be [facility] 90 day supply. CB #: [id number].

## 2023-06-05 ENCOUNTER — Other Ambulatory Visit: Payer: Self-pay | Admitting: Internal Medicine

## 2023-06-05 MED ORDER — SITAGLIPTIN PHOS-METFORMIN HCL 50-1000 MG PO TABS: 1.0000 | ORAL_TABLET | Freq: Two times a day (BID) | ORAL | 3 refills | Status: DC

## 2023-06-19 ENCOUNTER — Other Ambulatory Visit (HOSPITAL_COMMUNITY): Payer: Self-pay

## 2023-06-19 ENCOUNTER — Other Ambulatory Visit: Payer: Self-pay | Admitting: Internal Medicine

## 2023-06-19 ENCOUNTER — Telehealth: Payer: Self-pay

## 2023-06-19 NOTE — Telephone Encounter (Signed)
 Pharmacy Patient Advocate Encounter  Received notification from [facility] that Prior Authorization for Janumet 50-1000MG  tablets  has been DENIED.  See denial reason below. No denial letter attached in CMM. Will attach denial letter to Media tab once received.   PA #/Case ID/Reference #: [id number]

## 2023-06-19 NOTE — Telephone Encounter (Signed)
 Pharmacy Patient Advocate Encounter   Received notification from CoverMyMeds that prior authorization for Janumet 50-1000MG  tablets is required/requested.   Insurance verification completed.   The patient is insured through [facility] .   Per test claim: PA required; PA submitted to above mentioned insurance via CoverMyMeds Key/confirmation #/EOC [id number] Status is pending

## 2023-06-20 LAB — COMPLETE METABOLIC PANEL WITH GFR
AG Ratio: 2.1 (calc) (ref 1.0–2.5)
ALT: 17 U/L (ref 9–46)
AST: 11 U/L (ref 10–35)
Albumin: 4.5 g/dL (ref 3.6–5.1)
Alkaline phosphatase (APISO): 58 U/L (ref 35–144)
BUN: 20 mg/dL (ref 7–25)
CO2: 24 mmol/L (ref 20–32)
Calcium: 9.3 mg/dL (ref 8.6–10.3)
Chloride: 106 mmol/L (ref 98–110)
Creat: 0.88 mg/dL (ref 0.70–1.35)
Globulin: 2.1 g/dL (ref 1.9–3.7)
Glucose, Bld: 124 mg/dL — ABNORMAL HIGH (ref 65–99)
Potassium: 3.8 mmol/L (ref 3.5–5.3)
Sodium: 140 mmol/L (ref 135–146)
Total Bilirubin: 0.9 mg/dL (ref 0.2–1.2)
Total Protein: 6.6 g/dL (ref 6.1–8.1)
eGFR: 94 mL/min/{1.73_m2} (ref >=60)

## 2023-06-20 LAB — HEMOGLOBIN A1C W/OUT EAG: Hgb A1c MFr Bld: 6.4 %{Hb} — ABNORMAL HIGH (ref <5.7)

## 2023-06-26 ENCOUNTER — Encounter: Payer: Self-pay | Admitting: Internal Medicine

## 2023-06-26 ENCOUNTER — Ambulatory Visit: Payer: BC Managed Care – PPO | Admitting: Internal Medicine

## 2023-06-26 VITALS — BP 130/75 | HR 86 | Temp 98.2°F | Ht 71.0 in | Wt 294.0 lb

## 2023-06-26 DIAGNOSIS — E785 Hyperlipidemia, unspecified: Secondary | ICD-10-CM

## 2023-06-26 DIAGNOSIS — E1122 Type 2 diabetes mellitus with diabetic chronic kidney disease: Secondary | ICD-10-CM

## 2023-06-26 DIAGNOSIS — Z794 Long term (current) use of insulin: Secondary | ICD-10-CM

## 2023-06-26 DIAGNOSIS — Z6841 Body Mass Index (BMI) 40.0 and over, adult: Secondary | ICD-10-CM

## 2023-06-26 DIAGNOSIS — I2510 Atherosclerotic heart disease of native coronary artery without angina pectoris: Secondary | ICD-10-CM

## 2023-06-26 DIAGNOSIS — N181 Chronic kidney disease, stage 1: Secondary | ICD-10-CM

## 2023-06-26 DIAGNOSIS — E66813 Obesity, class 3: Secondary | ICD-10-CM | POA: Diagnosis not present

## 2023-06-26 MED ORDER — METFORMIN HCL 1000 MG PO TABS: 1000.0000 mg | ORAL_TABLET | Freq: Two times a day (BID) | ORAL | 3 refills | Status: DC

## 2023-06-26 NOTE — Assessment & Plan Note (Signed)
 On Lipitor

## 2023-06-26 NOTE — Assessment & Plan Note (Signed)
Cont w/Plavix, ASA, Coreg, Lipitor, Farxiga, Ozempic NTG prn Treat DM,HTN 

## 2023-06-26 NOTE — Progress Notes (Signed)
 Subjective:  Patient ID: Andrew Carson, male    DOB: 1956/03/06  Age: 68 y.o. MRN: [id number]  CC: Medical Management of Chronic Issues (3 mnth f/u, Discuss getting a med change )   HPI Andrew Carson presents for DM, CAD, HTN Janumet - not covered now  Outpatient Medications Prior to Visit  Medication Sig Dispense Refill   atorvastatin (LIPITOR) 80 MG tablet TAKE 1 TABLET (80 MG TOTAL) BY MOUTH DAILY 90 tablet 3   Bayer Microlet Lancets lancets Use to help check blood sugar twice a day 100 each 12   benazepril-hydrochlorthiazide (LOTENSIN HCT) 20-12.5 MG tablet TAKE 1 TABLET BY MOUTH DAILY. ANNUAL APPT DUE IN AUGUST MUST SEE PROVIDER FOR FUTURE REFILLS 90 tablet 3   carvedilol (COREG) 12.5 MG tablet TAKE 1 TABLET BY MOUTH TWICE A DAY WITH FOODTAKE 1 TABLET BY MOUTH TWICE A DAY WITH FOOD 180 tablet 3   celecoxib (CELEBREX) 200 MG capsule TAKE 1 CAPSULE BY MOUTH EVERY DAY AS NEEDED FOR MODERATE PAIN 90 capsule 0   Cholecalciferol (CVS D3) 25 MCG (1000 UT) capsule TAKE 1 CAPSULE BY MOUTH EVERY DAY 90 capsule 3   clopidogrel (PLAVIX) 75 MG tablet TAKE 1 TABLET BY MOUTH EVERY DAY. PLEASE KEEP 07/02/21 APPT FOR FUTURE REFILLS. 90 tablet 3   Continuous Glucose Receiver (DEXCOM G7 RECEIVER) DEVI Use as directed 1 each 1   Continuous Glucose Sensor (DEXCOM G7 SENSOR) MISC Replace q 10 days 3 each 11   dapagliflozin propanediol (FARXIGA) 10 MG TABS tablet TAKE 1 TABLET BY MOUTH EVERY DAY 90 tablet 1   diclofenac Sodium (VOLTAREN) 1 % GEL Apply 2 g topically 4 (four) times daily. 100 g 3   ferrous sulfate 325 (65 FE) MG tablet TAKE 1 TABLET BY MOUTH EVERY DAY 90 tablet 1   glucose blood (CONTOUR NEXT TEST) test strip Use to check blood sugars twice day 100 each 12   methylPREDNISolone (MEDROL DOSEPAK) 4 MG TBPK tablet As directed 21 tablet 0   nitroGLYCERIN (NITROSTAT) 0.4 MG SL tablet Place 1 tablet (0.4 mg total) under the tongue every 5 (five) minutes as needed for chest pain. 25 tablet 3    pantoprazole (PROTONIX) 40 MG tablet TAKE 1 TABLET BY MOUTH EVERY DAY 90 tablet 3   Semaglutide, 2 MG/DOSE, 8 MG/3ML SOPN Inject 2 mg as directed once a week. 9 mL 3   tamsulosin (FLOMAX) 0.4 MG CAPS capsule Take 0.4 mg by mouth daily.     sitaGLIPtin-metformin (JANUMET) 50-1000 MG tablet Take 1 tablet by mouth 2 (two) times daily with a meal. 180 tablet 3   No facility-administered medications prior to visit.    ROS: Review of Systems  Constitutional:  Negative for appetite change, fatigue and unexpected weight change.  HENT:  Negative for congestion, nosebleeds, sneezing, sore throat and trouble swallowing.   Eyes:  Negative for itching and visual disturbance.  Respiratory:  Negative for cough.   Cardiovascular:  Negative for chest pain, palpitations and leg swelling.  Gastrointestinal:  Negative for abdominal distention, blood in stool, diarrhea and nausea.  Genitourinary:  Negative for frequency and hematuria.  Musculoskeletal:  Negative for back pain, gait problem, joint swelling and neck pain.  Skin:  Negative for rash.  Neurological:  Negative for dizziness, tremors, speech difficulty and weakness.  Psychiatric/Behavioral:  Negative for agitation, dysphoric mood and sleep disturbance. The patient is not nervous/anxious.     Objective:  BP 130/75   Pulse 86   Temp 98.2  F (36.8 C) (Oral)   Ht 5\' 11"  (1.803 m)   Wt 294 lb (133.4 kg)   SpO2 95%   BMI 41.00 kg/m   BP Readings from Last 3 Encounters:  06/26/23 130/75  03/27/23 130/80  11/21/22 (!) 132/90    Wt Readings from Last 3 Encounters:  06/26/23 294 lb (133.4 kg)  03/27/23 (!) 302 lb (137 kg)  11/21/22 (!) 304 lb 3.2 oz (138 kg)    Physical Exam Constitutional:      General: He is not in acute distress.    Appearance: He is well-developed.     Comments: NAD  Eyes:     Conjunctiva/sclera: Conjunctivae normal.     Pupils: Pupils are equal, round, and reactive to light.  Neck:     Thyroid: No thyromegaly.      Vascular: No JVD.  Cardiovascular:     Rate and Rhythm: Normal rate and regular rhythm.     Heart sounds: Normal heart sounds. No murmur heard.    No friction rub. No gallop.  Pulmonary:     Effort: Pulmonary effort is normal. No respiratory distress.     Breath sounds: Normal breath sounds. No wheezing or rales.  Chest:     Chest wall: No tenderness.  Abdominal:     General: Bowel sounds are normal. There is no distension.     Palpations: Abdomen is soft. There is no mass.     Tenderness: There is no abdominal tenderness. There is no guarding or rebound.  Musculoskeletal:        General: No tenderness. Normal range of motion.     Cervical back: Normal range of motion.  Lymphadenopathy:     Cervical: No cervical adenopathy.  Skin:    General: Skin is warm and dry.     Findings: No rash.  Neurological:     Mental Status: He is alert and oriented to person, place, and time.     Cranial Nerves: No cranial nerve deficit.     Motor: No abnormal muscle tone.     Coordination: Coordination normal.     Gait: Gait normal.     Deep Tendon Reflexes: Reflexes are normal and symmetric.  Psychiatric:        Behavior: Behavior normal.        Thought Content: Thought content normal.        Judgment: Judgment normal.     Lab Results  Component Value Date   WBC 9.9 03/20/2023   HGB 14.8 03/20/2023   HCT 45.3 03/20/2023   PLT 234 03/20/2023   GLUCOSE 124 (H) 06/19/2023   CHOL 109 03/20/2023   TRIG 110 03/20/2023   HDL 37 (L) 03/20/2023   LDLDIRECT 149.9 03/12/2007   LDLCALC 52 03/20/2023   ALT 17 06/19/2023   AST 11 06/19/2023   NA 140 06/19/2023   K 3.8 06/19/2023   CL 106 06/19/2023   CREATININE 0.88 06/19/2023   BUN 20 06/19/2023   CO2 24 06/19/2023   TSH 1.56 03/20/2023   PSA 0.75 07/17/2020   PSA 0.75 07/17/2020   HGBA1C 6.4 (H) 06/19/2023   MICROALBUR 0.4 03/20/2023    No results found.  Assessment & Plan:   Problem List Items Addressed This Visit      Coronary artery disease   Cont w/Plavix, ASA, Coreg, Lipitor, Farxiga, Ozempic NTG prn Treat DM,HTN      DM2 (diabetes mellitus, type 2) (HCC) - Primary   On Ozempic, Farxiga Janumet is not covered.  Changed to Metformin 1000 mg/d      Relevant Medications   metFORMIN (GLUCOPHAGE) 1000 MG tablet   Dyslipidemia (high LDL; low HDL)   On Lipitor      Obesity   Losing wt On Ozempic 2 mg/d      Relevant Medications   metFORMIN (GLUCOPHAGE) 1000 MG tablet      Meds ordered this encounter  Medications   metFORMIN (GLUCOPHAGE) 1000 MG tablet    Sig: Take 1 tablet (1,000 mg total) by mouth 2 (two) times daily with a meal.    Dispense:  180 tablet    Refill:  3      Follow-up: No follow-ups on file.  Sonda Primes, MD

## 2023-06-26 NOTE — Assessment & Plan Note (Addendum)
 On Ozempic, [employer] is not covered. Changed to Metformin 1000 mg/d

## 2023-06-26 NOTE — Assessment & Plan Note (Addendum)
 Losing wt On Ozempic 2 mg/d

## 2023-06-27 ENCOUNTER — Other Ambulatory Visit: Payer: Self-pay | Admitting: Internal Medicine

## 2023-09-01 ENCOUNTER — Other Ambulatory Visit: Payer: Self-pay | Admitting: Internal Medicine

## 2023-09-05 ENCOUNTER — Other Ambulatory Visit: Payer: Self-pay | Admitting: Internal Medicine

## 2023-09-06 ENCOUNTER — Other Ambulatory Visit: Payer: Self-pay | Admitting: Internal Medicine

## 2023-09-06 NOTE — Telephone Encounter (Unsigned)
 Copied from CRM [id number]. Topic: Clinical - Medication Refill >> Sep 06, 2023  1:03 PM [place] wrote: Medication: Cholecalciferol (CVS D3) 25 MCG (1000 UT) capsule  Has the patient contacted their pharmacy? Yes (Agent: If no, request that the patient contact the pharmacy for the refill. If patient does not wish to contact the pharmacy document the reason why and proceed with request.) (Agent: If yes, when and what did the pharmacy advise?)  This is the patient's preferred pharmacy:  [id number] [place], [place] - [address] AT [facility] [address] [place] [address] Phone: [phone] Fax: [phone]  Is this the correct pharmacy for this prescription? Yes If no, delete pharmacy and type the correct one.   Has the prescription been filled recently? Yes  Is the patient out of the medication? Yes  Has the patient been seen for an appointment in the last year OR does the patient have an upcoming appointment? Yes  Can we respond through MyChart? Yes  Agent: Please be advised that Rx refills may take up to 3 business days. We ask that you follow-up with your pharmacy.

## 2023-09-07 MED ORDER — CVS D3 25 MCG (1000 UT) PO CAPS: 1000.0000 [IU] | ORAL_CAPSULE | Freq: Every day | ORAL | 3 refills | Status: AC

## 2023-09-25 ENCOUNTER — Other Ambulatory Visit: Payer: Self-pay | Admitting: Internal Medicine

## 2023-09-26 LAB — COMPREHENSIVE METABOLIC PANEL WITH GFR
AG Ratio: 1.8 (calc) (ref 1.0–2.5)
ALT: 21 U/L (ref 9–46)
AST: 15 U/L (ref 10–35)
Albumin: 3.8 g/dL (ref 3.6–5.1)
Alkaline phosphatase (APISO): 51 U/L (ref 35–144)
BUN: 16 mg/dL (ref 7–25)
CO2: 24 mmol/L (ref 20–32)
Calcium: 8.9 mg/dL (ref 8.6–10.3)
Chloride: 109 mmol/L (ref 98–110)
Creat: 0.88 mg/dL (ref 0.70–1.35)
Globulin: 2.1 g/dL (ref 1.9–3.7)
Glucose, Bld: 115 mg/dL — ABNORMAL HIGH (ref 65–99)
Potassium: 3.5 mmol/L (ref 3.5–5.3)
Sodium: 144 mmol/L (ref 135–146)
Total Bilirubin: 0.7 mg/dL (ref 0.2–1.2)
Total Protein: 5.9 g/dL — ABNORMAL LOW (ref 6.1–8.1)
eGFR: 94 mL/min/{1.73_m2} (ref >=60)

## 2023-09-26 LAB — HEMOGLOBIN A1C W/OUT EAG: Hgb A1c MFr Bld: 6.3 % — ABNORMAL HIGH (ref <5.7)

## 2023-09-27 ENCOUNTER — Ambulatory Visit: Payer: Self-pay | Admitting: Internal Medicine

## 2023-10-02 ENCOUNTER — Encounter: Payer: Self-pay | Admitting: Internal Medicine

## 2023-10-02 ENCOUNTER — Ambulatory Visit: Admitting: Internal Medicine

## 2023-10-02 VITALS — BP 130/80 | HR 86 | Temp 98.2°F | Ht 71.0 in | Wt 293.0 lb

## 2023-10-02 DIAGNOSIS — R770 Abnormality of albumin: Secondary | ICD-10-CM | POA: Insufficient documentation

## 2023-10-02 DIAGNOSIS — E66813 Obesity, class 3: Secondary | ICD-10-CM

## 2023-10-02 DIAGNOSIS — I10 Essential (primary) hypertension: Secondary | ICD-10-CM

## 2023-10-02 DIAGNOSIS — Z6841 Body Mass Index (BMI) 40.0 and over, adult: Secondary | ICD-10-CM

## 2023-10-02 DIAGNOSIS — E1122 Type 2 diabetes mellitus with diabetic chronic kidney disease: Secondary | ICD-10-CM

## 2023-10-02 DIAGNOSIS — I2510 Atherosclerotic heart disease of native coronary artery without angina pectoris: Secondary | ICD-10-CM | POA: Diagnosis not present

## 2023-10-02 DIAGNOSIS — Z794 Long term (current) use of insulin: Secondary | ICD-10-CM

## 2023-10-02 DIAGNOSIS — N181 Chronic kidney disease, stage 1: Secondary | ICD-10-CM

## 2023-10-02 NOTE — Progress Notes (Signed)
 Subjective:  Patient ID: Andrew Carson, male    DOB: 02-01-1956  Age: 68 y.o. MRN: [id number]  CC: Medical Management of Chronic Issues (3 mnth f/u )   HPI TAN CLOPPER presents for DM, HTN, CAD  Outpatient Medications Prior to Visit  Medication Sig Dispense Refill   atorvastatin  (LIPITOR) 80 MG tablet TAKE 1 TABLET BY MOUTH EVERY DAY 90 tablet 3   Bayer Microlet Lancets lancets Use to help check blood sugar twice a day 100 each 12   benazepril -hydrochlorthiazide (LOTENSIN  HCT) 20-12.5 MG tablet TAKE 1 TABLET BY MOUTH DAILY. ANNUAL APPT DUE IN AUGUST MUST SEE PROVIDER FOR FUTURE REFILLS 90 tablet 3   carvedilol  (COREG ) 12.5 MG tablet TAKE 1 TABLET BY MOUTH TWICE A DAY WITH FOODTAKE 1 TABLET BY MOUTH TWICE A DAY WITH FOOD 180 tablet 3   celecoxib  (CELEBREX ) 200 MG capsule TAKE 1 CAPSULE BY MOUTH EVERY DAY AS NEEDED FOR MODERATE PAIN 90 capsule 0   Cholecalciferol (CVS D3) 25 MCG (1000 UT) capsule Take 1 capsule (1,000 Units total) by mouth daily. 90 capsule 3   clopidogrel  (PLAVIX ) 75 MG tablet TAKE 1 TABLET BY MOUTH EVERY DAY. PLEASE KEEP 07/02/21 APPT FOR FUTURE REFILLS. 90 tablet 3   Continuous Glucose Receiver (DEXCOM G7 RECEIVER) DEVI Use as directed 1 each 1   Continuous Glucose Sensor (DEXCOM G7 SENSOR) MISC Replace q 10 days 3 each 11   diclofenac  Sodium (VOLTAREN ) 1 % GEL Apply 2 g topically 4 (four) times daily. 100 g 3   FARXIGA  10 MG TABS tablet TAKE 1 TABLET BY MOUTH EVERY DAY 90 tablet 1   ferrous sulfate  325 (65 FE) MG tablet TAKE 1 TABLET BY MOUTH EVERY DAY 90 tablet 1   glucose blood (CONTOUR NEXT TEST) test strip Use to check blood sugars twice day 100 each 12   metFORMIN  (GLUCOPHAGE ) 1000 MG tablet Take 1 tablet (1,000 mg total) by mouth 2 (two) times daily with a meal. 180 tablet 3   methylPREDNISolone  (MEDROL  DOSEPAK) 4 MG TBPK tablet As directed 21 tablet 0   nitroGLYCERIN  (NITROSTAT ) 0.4 MG SL tablet Place 1 tablet (0.4 mg total) under the tongue every 5 (five)  minutes as needed for chest pain. 25 tablet 3   pantoprazole  (PROTONIX ) 40 MG tablet TAKE 1 TABLET BY MOUTH EVERY DAY 90 tablet 3   Semaglutide , 2 MG/DOSE, 8 MG/3ML SOPN Inject 2 mg as directed once a week. 9 mL 3   tamsulosin (FLOMAX) 0.4 MG CAPS capsule Take 0.4 mg by mouth daily.     No facility-administered medications prior to visit.    ROS: Review of Systems  Constitutional:  Negative for appetite change, fatigue and unexpected weight change.  HENT:  Negative for congestion, nosebleeds, sneezing, sore throat and trouble swallowing.   Eyes:  Negative for itching and visual disturbance.  Respiratory:  Negative for cough.   Cardiovascular:  Negative for chest pain, palpitations and leg swelling.  Gastrointestinal:  Positive for constipation. Negative for abdominal distention, blood in stool, diarrhea and nausea.  Genitourinary:  Negative for frequency and hematuria.  Musculoskeletal:  Negative for back pain, gait problem, joint swelling and neck pain.  Skin:  Negative for rash.  Neurological:  Negative for dizziness, tremors, speech difficulty and weakness.  Psychiatric/Behavioral:  Negative for agitation, dysphoric mood, sleep disturbance and suicidal ideas. The patient is not nervous/anxious.     Objective:  BP 130/80   Pulse 86   Temp 98.2 F (36.8 C) (Oral)  Ht 5' 11 (1.803 m)   Wt 293 lb (132.9 kg)   SpO2 96%   BMI 40.87 kg/m   BP Readings from Last 3 Encounters:  10/02/23 130/80  06/26/23 130/75  03/27/23 130/80    Wt Readings from Last 3 Encounters:  10/02/23 293 lb (132.9 kg)  06/26/23 294 lb (133.4 kg)  03/27/23 (!) 302 lb (137 kg)    Physical Exam Constitutional:      General: He is not in acute distress.    Appearance: He is well-developed. He is obese.     Comments: NAD   Eyes:     Conjunctiva/sclera: Conjunctivae normal.     Pupils: Pupils are equal, round, and reactive to light.   Neck:     Thyroid: No thyromegaly.     Vascular: No JVD.    Cardiovascular:     Rate and Rhythm: Normal rate and regular rhythm.     Heart sounds: Normal heart sounds. No murmur heard.    No friction rub. No gallop.  Pulmonary:     Effort: Pulmonary effort is normal. No respiratory distress.     Breath sounds: Normal breath sounds. No wheezing or rales.  Chest:     Chest wall: No tenderness.  Abdominal:     General: Bowel sounds are normal. There is no distension.     Palpations: Abdomen is soft. There is no mass.     Tenderness: There is no abdominal tenderness. There is no guarding or rebound.   Musculoskeletal:        General: No tenderness. Normal range of motion.     Cervical back: Normal range of motion.  Lymphadenopathy:     Cervical: No cervical adenopathy.   Skin:    General: Skin is warm and dry.     Findings: No rash.   Neurological:     Mental Status: He is alert and oriented to person, place, and time.     Cranial Nerves: No cranial nerve deficit.     Motor: No abnormal muscle tone.     Coordination: Coordination normal.     Gait: Gait normal.     Deep Tendon Reflexes: Reflexes are normal and symmetric.   Psychiatric:        Behavior: Behavior normal.        Thought Content: Thought content normal.        Judgment: Judgment normal.     Lab Results  Component Value Date   WBC 9.9 03/20/2023   HGB 14.8 03/20/2023   HCT 45.3 03/20/2023   PLT 234 03/20/2023   GLUCOSE 115 (H) 09/25/2023   CHOL 109 03/20/2023   TRIG 110 03/20/2023   HDL 37 (L) 03/20/2023   LDLDIRECT 149.9 03/12/2007   LDLCALC 52 03/20/2023   ALT 21 09/25/2023   AST 15 09/25/2023   NA 144 09/25/2023   K 3.5 09/25/2023   CL 109 09/25/2023   CREATININE 0.88 09/25/2023   BUN 16 09/25/2023   CO2 24 09/25/2023   TSH 1.56 03/20/2023   PSA 0.75 07/17/2020   PSA 0.75 07/17/2020   HGBA1C 6.3 (H) 09/25/2023   MICROALBUR 0.4 03/20/2023    No results found.  Assessment & Plan:   Problem List Items Addressed This Visit     DM2 (diabetes  mellitus, type 2) (HCC) - Primary   On Ozempic , Farxiga  Janumet  is not covered. Changed to Metformin  1000 mg/d      Obesity   Wt Readings from Last 3 Encounters:  10/02/23  293 lb (132.9 kg)  06/26/23 294 lb (133.4 kg)  03/27/23 (!) 302 lb (137 kg)  Ozempic  2 mg/wk       Essential hypertension   Chronic Lotensin  HCT, Coreg       Coronary artery disease   Cont w/Plavix , ASA, Coreg , Lipitor, Farxiga , Ozempic  NTG prn Treat DM,HTN      Low serum albumin   ?lab error Will re-check         No orders of the defined types were placed in this encounter.     Follow-up: Return in about 3 months (around 01/02/2024) for Wellness Exam.  Marolyn Noel, MD

## 2023-10-02 NOTE — Assessment & Plan Note (Signed)
 Wt Readings from Last 3 Encounters:  10/02/23 293 lb (132.9 kg)  06/26/23 294 lb (133.4 kg)  03/27/23 (!) 302 lb (137 kg)  Ozempic  2 mg/wk

## 2023-10-02 NOTE — Assessment & Plan Note (Signed)
Chronic Lotensin HCT, Coreg 

## 2023-10-02 NOTE — Assessment & Plan Note (Signed)
 On Ozempic, [employer] is not covered. Changed to Metformin 1000 mg/d

## 2023-10-02 NOTE — Assessment & Plan Note (Signed)
Cont w/Plavix, ASA, Coreg, Lipitor, Farxiga, Ozempic NTG prn Treat DM,HTN 

## 2023-10-02 NOTE — Assessment & Plan Note (Signed)
?  lab error Will re-check

## 2023-10-18 ENCOUNTER — Other Ambulatory Visit: Payer: Self-pay | Admitting: Internal Medicine

## 2023-11-12 ENCOUNTER — Other Ambulatory Visit: Payer: Self-pay | Admitting: Internal Medicine

## 2023-12-03 NOTE — Progress Notes (Unsigned)
 Cardiology Office Note:    Date:  12/04/2023   ID:  Andrew Carson, DOB Oct 01, 1955, MRN [id number]  PCP:  Garald Karlynn GAILS, MD  Cardiologist:  Wilbert Bihari, MD    Referring MD: Garald Karlynn GAILS, MD   Chief Complaint  Patient presents with   Coronary Artery Disease   Hypertension   Hyperlipidemia    History of Present Illness:    Andrew Carson is a 68 y.o. male with a hx of  HTN and  Hyperlipidemia.  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  The pain was very severe and lasted 20 minutes.  He stopped and called an ambulance and was given NGT and ASA.  In ER in [place]  he ruled in for MI with elevated troponin.  He was transferred to [place]  [facility] and underwent cath showing a 90% LAD s/p PCI with residual dz with  70% distal LCx and distal RCA lesions. Since then he has done well with no further CP.   He is here today for follow-up.  He is doing well.  He denies any chest pain or pressure, SOB, DOE,  dizziness, palpitations, PND, orthopnea or syncope.He has some LE edema when driving his truck all day but it is down by the am.   Past Medical History:  Diagnosis Date   Anemia 08/11/2017   Colon  2017 Dr Avram Start iron 2019 GI ref if Hgb not better   Coronary artery disease 08/07/2018   a. CAD with NSTEMI 07/2018 in [place]  s/p DES to LAD with residual RCA/LCx disease.   Diabetes mellitus 2011   type 2   Dyslipidemia (high LDL; low HDL) 10/15/2010   Elevated PSA 09/06/2013   5/15 due to UTI Dr Watt    Essential hypertension 02/23/2007   Finger pain, right 02/11/2011   10/12 R 4th prox phal - likely a partial flexor tendon rupture    GERD 03/12/2007    On Omeprazole    Hypogonadism male    KNEE PAIN 03/12/2007   11/19 L    Obesity    OSTEOARTHRITIS 03/12/2007   Chronic      Phimosis 01/29/2016   10/17 candida due to Farxiga  - tolerable   URTICARIA 06/05/2009   Qualifier:  Diagnosis of  By: Plotnikov MD, Karlynn GAILS    UTI (urinary tract infection) 09/06/2013   5/15 dr Watt    Venous insufficiency 08/19/2011   B LE mild R>L    Vitamin D deficiency 03/14/2007   Chronic       Past Surgical History:  Procedure Laterality Date   LUMBAR LAMINECTOMY  07/1995   L5-S1   WISDOM TOOTH EXTRACTION      Current Medications: Current Meds  Medication Sig   atorvastatin  (LIPITOR) 80 MG tablet TAKE 1 TABLET BY MOUTH EVERY DAY   Bayer Microlet Lancets lancets Use to help check blood sugar twice a day   benazepril -hydrochlorthiazide (LOTENSIN  HCT) 20-12.5 MG tablet TAKE 1 TABLET BY MOUTH DAILY. ANNUAL APPT DUE IN AUGUST MUST SEE PROVIDER FOR FUTURE REFILLS   carvedilol  (COREG ) 12.5 MG tablet TAKE 1 TABLET BY MOUTH TWICE A DAY WITH FOODTAKE 1 TABLET BY MOUTH TWICE A DAY WITH FOOD   celecoxib  (CELEBREX ) 200 MG capsule TAKE 1 CAPSULE BY MOUTH EVERY DAY AS NEEDED FOR MODERATE PAIN   Cholecalciferol ([facility] D3) 25 MCG (1000 UT) capsule Take 1 capsule (1,000 Units total) by mouth daily.   clopidogrel  (  PLAVIX ) 75 MG tablet TAKE 1 TABLET BY MOUTH EVERY DAY. PLEASE KEEP 07/02/21 APPT FOR FUTURE REFILLS.   Continuous Glucose Receiver (DEXCOM G7 RECEIVER) DEVI Use as directed   Continuous Glucose Sensor (DEXCOM G7 SENSOR) MISC Replace q 10 days   diclofenac  Sodium (VOLTAREN ) 1 % GEL Apply 2 g topically 4 (four) times daily.   FARXIGA  10 MG TABS tablet TAKE 1 TABLET BY MOUTH EVERY DAY   ferrous sulfate  325 (65 FE) MG tablet TAKE 1 TABLET BY MOUTH EVERY DAY   glucose blood (CONTOUR NEXT TEST) test strip Use to check blood sugars twice day   metFORMIN  (GLUCOPHAGE ) 1000 MG tablet Take 1 tablet (1,000 mg total) by mouth 2 (two) times daily with a meal.   methylPREDNISolone  (MEDROL  DOSEPAK) 4 MG TBPK tablet As directed   nitroGLYCERIN  (NITROSTAT ) 0.4 MG SL tablet Place 1 tablet (0.4 mg total) under the tongue every 5 (five) minutes as needed for chest pain.   pantoprazole  (PROTONIX ) 40 MG  tablet TAKE 1 TABLET BY MOUTH EVERY DAY   Semaglutide , 2 MG/DOSE, 8 MG/3ML SOPN Inject 2 mg as directed once a week.   tamsulosin (FLOMAX) 0.4 MG CAPS capsule Take 0.4 mg by mouth daily.     Allergies:   Triamcinolone    Social History   Socioeconomic History   Marital status: Married    Spouse name: Not on file   Number of children: Not on file   Years of education: Not on file   Highest education level: 12th grade  Occupational History   Not on file  Tobacco Use   Smoking status: Never    Passive exposure: Never   Smokeless tobacco: Never  Vaping Use   Vaping status: Never Used  Substance and Sexual Activity   Alcohol use: No   Drug use: No   Sexual activity: Yes  Other Topics Concern   Not on file  Social History Narrative   Truck driver   Married 6 kids   Social Drivers of Health   Financial Resource Strain: Low Risk  (09/28/2023)   Overall Financial Resource Strain (CARDIA)    Difficulty of Paying Living Expenses: Not hard at all  Food Insecurity: No Food Insecurity (09/28/2023)   Hunger Vital Sign    Worried About Running Out of Food in the Last Year: Never true    Ran Out of Food in the Last Year: Never true  Transportation Needs: No Transportation Needs (09/28/2023)   PRAPARE - Administrator, Civil Service (Medical): No    Lack of Transportation (Non-Medical): No  Physical Activity: Insufficiently Active (09/28/2023)   Exercise Vital Sign    Days of Exercise per Week: 1 day    Minutes of Exercise per Session: 30 min  Stress: No Stress Concern Present (09/28/2023)   [employer] of Occupational Health - Occupational Stress Questionnaire    Feeling of Stress: Not at all  Social Connections: Socially Integrated (09/28/2023)   Social Connection and Isolation Panel    Frequency of Communication with Friends and Family: More than three times a week    Frequency of Social Gatherings with Friends and Family: More than three times a week    Attends  Religious Services: More than 4 times per year    Active Member of [employer] or Organizations: Yes    Attends Engineer, structural: More than 4 times per year    Marital Status: Married     Family History: The patient's family history includes COPD  in his mother; Heart disease (age of onset: 36) in his father; Heart disease (age of onset: 41) in his mother; Kidney disease in an other family member. There is no history of Colon cancer, Esophageal cancer, Rectal cancer, or Stomach cancer.  ROS:   Please see the history of present illness.    ROS  All other systems reviewed and negative.   EKGs/Labs/Other Studies Reviewed:    The following studies were reviewed today: Outside labs from [facility]  EKG Interpretation Date/Time:  Monday December 04 2023 08:04:28 EDT Ventricular Rate:  86 PR Interval:  186 QRS Duration:  156 QT Interval:  416 QTC Calculation: 497 R Axis:   37  Text Interpretation: Normal sinus rhythm Right bundle branch block When compared with ECG of 21-Nov-2022 13:16, Right bundle branch block is now Present Confirmed by Shlomo Corning ([id number]) on 12/04/2023 8:11:02 AM    Recent Labs: 03/20/2023: Hemoglobin 14.8; Platelets 234; TSH 1.56 09/25/2023: ALT 21; BUN 16; Creat 0.88; Potassium 3.5; Sodium 144   Recent Lipid Panel    Component Value Date/Time   CHOL 109 03/20/2023 0819   TRIG 110 03/20/2023 0819   HDL 37 (L) 03/20/2023 0819   CHOLHDL 2.9 03/20/2023 0819   VLDL 30 09/18/2015 0817   LDLCALC 52 03/20/2023 0819   LDLDIRECT 149.9 03/12/2007 0834    Physical Exam:    VS:  BP (!) 152/90   Pulse 86   Ht 5' 11 (1.803 m)   Wt 290 lb 6.4 oz (131.7 kg)   SpO2 99%   BMI 40.50 kg/m     Wt Readings from Last 3 Encounters:  12/04/23 290 lb 6.4 oz (131.7 kg)  10/02/23 293 lb (132.9 kg)  06/26/23 294 lb (133.4 kg)     GEN: Well nourished, well developed in no acute distress HEENT: Normal NECK: No JVD; No carotid bruits LYMPHATICS: No  lymphadenopathy CARDIAC:RRR, no murmurs, rubs, gallops RESPIRATORY:  Clear to auscultation without rales, wheezing or rhonchi  ABDOMEN: Soft, non-tender, non-distended MUSCULOSKELETAL:  No edema; No deformity  SKIN: Warm and dry NEUROLOGIC:  Alert and oriented x 3 PSYCHIATRIC:  Normal affect  ASSESSMENT:    1. Coronary artery disease involving native coronary artery of native heart without angina pectoris   2. Essential hypertension   3. Dyslipidemia (high LDL; low HDL)     PLAN:    In order of problems listed above:  ASCAD New RBBB - s/p NSTEMI in [place]  with cath showing 90% LAD and 70% distal LCx and distal RCA lesions.  S/P PCI of the LAD 07/2018. - He denies any anginal chest pain or shortness of breath - he has a new RBBB on EKG today - will get a lexiscan  myoview  to rule out ischemia - Informed Consent   Shared Decision Making/Informed Consent The risks [chest pain, shortness of breath, cardiac arrhythmias, dizziness, blood pressure fluctuations, myocardial infarction, stroke/transient ischemic attack, nausea, vomiting, allergic reaction, radiation exposure, metallic taste sensation and life-threatening complications (estimated to be 1 in 10,000)], benefits (risk stratification, diagnosing coronary artery disease, treatment guidance) and alternatives of a nuclear stress test were discussed in detail with Mr. Geil and he agrees to proceed. - Continue atorvastatin  80 mg daily, Plavix  75 mg daily, carvedilol  12.5 mg twice daily with as needed refills  2.  HTN - BP elevated this am but has not taken meds yet today - Continue carvedilol  12.5 mg twice daily and Lotensin  HCT 20-12.5 mg daily with as needed refills - I have personally  reviewed and interpreted outside labs performed by patient's PCP which showed serum creatinine 0.88 and potassium 3.5 on 09/25/2023 - I have asked him to check his BP daily for a week and call with results  3.  HLD - LDL goal < 70 - Continue  atorvastatin  80 mg daily - I have personally reviewed and interpreted outside labs performed by patient's PCP which showed LDL 52, HDL 37 on 03/20/2023 and ALT 21 on 09/25/2023  Followup with me in 1 year  Medication Adjustments/Labs and Tests Ordered: Current medicines are reviewed at length with the patient today.  Concerns regarding medicines are outlined above.  Orders Placed This Encounter  Procedures   EKG 12-Lead   No orders of the defined types were placed in this encounter.   Signed, Wilbert Bihari, MD  12/04/2023 8:12 AM    [facility]

## 2023-12-04 ENCOUNTER — Other Ambulatory Visit: Payer: Self-pay | Admitting: Internal Medicine

## 2023-12-04 ENCOUNTER — Ambulatory Visit: Attending: Cardiology | Admitting: Cardiology

## 2023-12-04 ENCOUNTER — Encounter: Payer: Self-pay | Admitting: Cardiology

## 2023-12-04 VITALS — BP 152/90 | HR 86 | Ht 71.0 in | Wt 290.4 lb

## 2023-12-04 DIAGNOSIS — I10 Essential (primary) hypertension: Secondary | ICD-10-CM | POA: Diagnosis not present

## 2023-12-04 DIAGNOSIS — E785 Hyperlipidemia, unspecified: Secondary | ICD-10-CM

## 2023-12-04 DIAGNOSIS — I4510 Unspecified right bundle-branch block: Secondary | ICD-10-CM

## 2023-12-04 DIAGNOSIS — I2510 Atherosclerotic heart disease of native coronary artery without angina pectoris: Secondary | ICD-10-CM | POA: Diagnosis not present

## 2023-12-04 MED ORDER — CELECOXIB 200 MG PO CAPS: 200.0000 mg | ORAL_CAPSULE | Freq: Every day | ORAL | 0 refills | Status: DC

## 2023-12-04 NOTE — Addendum Note (Signed)
 Addended by: SHLOMO WILBERT SAUNDERS on: 12/04/2023 08:46 AM   Modules accepted: Orders

## 2023-12-04 NOTE — Patient Instructions (Signed)
 Medication Instructions:  Your physician recommends that you continue on your current medications as directed. Please refer to the Current Medication list given to you today.  *If you need a refill on your cardiac medications before your next appointment, please call your pharmacy*  Lab Work: None.  If you have labs (blood work) drawn today and your tests are completely normal, you will receive your results only by: MyChart Message (if you have MyChart) OR A paper copy in the mail If you have any lab test that is abnormal or we need to change your treatment, we will call you to review the results.  Testing/Procedures: Your physician has requested that you have a lexiscan  myoview . For further information please visit [email or web address] Please follow instruction sheet, as given.   Follow-Up: At [facility], you and your health needs are our priority.  As part of our continuing mission to provide you with exceptional heart care, our providers are all part of one team.  This team includes your primary Cardiologist (physician) and Advanced Practice Providers or APPs (Physician Assistants and Nurse Practitioners) who all work together to provide you with the care you need, when you need it.  Your next appointment:   1 year(s)  Provider:   Wilbert Bihari, MD    We recommend signing up for the patient portal called MyChart.  Sign up information is provided on this After Visit Summary.  MyChart is used to connect with patients for Virtual Visits (Telemedicine).  Patients are able to view lab/test results, encounter notes, upcoming appointments, etc.  Non-urgent messages can be sent to your provider as well.   To learn more about what you can do with MyChart, go to [email or web address].   Other Instructions Please check your  blood pressure daily for week. Check your blood pressure at or around lunch or dinner time. Write down your readings along with the date and time of the  reading as well as the heart rate if your blood pressure monitor provides it. Call our office to give your blood pressure readings over the phone, send a photo of your readings on MyChart, or drop readings off at the front desk.

## 2023-12-04 NOTE — Addendum Note (Signed)
 Addended by: JANIT GENI CROME on: 12/04/2023 08:26 AM   Modules accepted: Orders

## 2023-12-04 NOTE — Telephone Encounter (Signed)
 Copied from CRM #[id number]. Topic: Clinical - Medication Refill >> Dec 04, 2023  2:50 PM Franky GRADE wrote: Medication: celecoxib  (CELEBREX ) 200 MG capsule [[id number]]  Has the patient contacted their pharmacy? Yes, they asked patient to contact his provider as they have not gotten a response for the refill request.  (Agent: If no, request that the patient contact the pharmacy for the refill. If patient does not wish to contact the pharmacy document the reason why and proceed with request.) (Agent: If yes, when and what did the pharmacy advise?)  This is the patient's preferred pharmacy:  [id number] [place], [place] - [address] AT [facility] [address] [place] [address] Phone: [phone] Fax: [phone]  Is this the correct pharmacy for this prescription? Yes If no, delete pharmacy and type the correct one.   Has the prescription been filled recently? No  Is the patient out of the medication? Yes  Has the patient been seen for an appointment in the last year OR does the patient have an upcoming appointment? Yes  Can we respond through MyChart? Yes  Agent: Please be advised that Rx refills may take up to 3 business days. We ask that you follow-up with your pharmacy.

## 2023-12-07 ENCOUNTER — Encounter (HOSPITAL_COMMUNITY): Payer: Self-pay | Admitting: *Deleted

## 2023-12-12 ENCOUNTER — Other Ambulatory Visit: Payer: Self-pay | Admitting: Internal Medicine

## 2023-12-18 ENCOUNTER — Other Ambulatory Visit: Payer: Self-pay | Admitting: Cardiology

## 2023-12-18 ENCOUNTER — Telehealth: Payer: Self-pay | Admitting: Cardiology

## 2023-12-18 DIAGNOSIS — I4510 Unspecified right bundle-branch block: Secondary | ICD-10-CM

## 2023-12-18 NOTE — Telephone Encounter (Signed)
 Paper Work Dropped Off: Patient dropped off blood pressure log for Andrew Carson.  Date:12/18/2023  Location of paper:  In [employer]

## 2023-12-19 ENCOUNTER — Ambulatory Visit (HOSPITAL_COMMUNITY)
Admission: RE | Admit: 2023-12-19 | Discharge: 2023-12-19 | Disposition: A | Discharge status: Home / Self Care | Source: Ambulatory Visit | Attending: Cardiology | Admitting: Cardiology

## 2023-12-19 DIAGNOSIS — I4510 Unspecified right bundle-branch block: Secondary | ICD-10-CM | POA: Insufficient documentation

## 2023-12-19 MED ORDER — REGADENOSON 0.4 MG/5ML IV SOLN
0.4000 mg | Freq: Once | INTRAVENOUS | Status: AC
  Administered 2023-12-19: 0.4 mg via INTRAVENOUS

## 2023-12-19 MED ORDER — TECHNETIUM TC 99M TETROFOSMIN IV KIT: 9.5000 | PACK | Freq: Once | INTRAVENOUS | Status: DC | PRN

## 2023-12-19 MED ORDER — REGADENOSON 0.4 MG/5ML IV SOLN
INTRAVENOUS | Status: AC
Start: 2023-12-19 — End: 2023-12-19
  Filled 2023-12-19: qty 5

## 2023-12-19 MED ORDER — TECHNETIUM TC 99M TETROFOSMIN IV KIT
38.2000 | PACK | Freq: Once | INTRAVENOUS | Status: AC | PRN
  Administered 2023-12-19: 38.2 via INTRAVENOUS

## 2023-12-19 MED ORDER — TECHNETIUM TC 99M TETROFOSMIN IV KIT
12.5000 | PACK | Freq: Once | INTRAVENOUS | Status: AC | PRN
Start: 2023-12-19 — End: 2023-12-19
  Administered 2023-12-19: 12.5 via INTRAVENOUS

## 2023-12-20 ENCOUNTER — Encounter: Payer: Self-pay | Admitting: Cardiology

## 2023-12-20 ENCOUNTER — Ambulatory Visit: Payer: Self-pay | Admitting: Cardiology

## 2023-12-20 DIAGNOSIS — I4510 Unspecified right bundle-branch block: Secondary | ICD-10-CM | POA: Insufficient documentation

## 2023-12-20 LAB — MYOCARDIAL PERFUSION IMAGING
LV dias vol: 129 mL (ref 62–150)
LV sys vol: 48 mL (ref 4.2–5.8)
Nuc Stress EF: 63 %
Peak HR: 96 {beats}/min
Rest HR: 73 {beats}/min
Rest Nuclear Isotope Dose: 12.5 mCi
SDS: 0
SRS: 0
SSS: 0
ST Depression (mm): 0 mm
Stress Nuclear Isotope Dose: 38.2 mCi
TID: 0.96

## 2023-12-27 ENCOUNTER — Ambulatory Visit: Payer: Self-pay | Admitting: Cardiology

## 2024-01-01 ENCOUNTER — Other Ambulatory Visit: Payer: Self-pay | Admitting: Internal Medicine

## 2024-01-02 ENCOUNTER — Ambulatory Visit: Payer: Self-pay | Admitting: Internal Medicine

## 2024-01-02 LAB — URINALYSIS, COMPLETE
Bacteria, UA: NONE SEEN /HPF
Bilirubin Urine: NEGATIVE
Hgb urine dipstick: NEGATIVE
Hyaline Cast: NONE SEEN /LPF
Leukocytes,Ua: NEGATIVE
Nitrite: NEGATIVE
Protein, ur: NEGATIVE
RBC / HPF: NONE SEEN /HPF (ref 0–2)
Specific Gravity, Urine: 1.045 — ABNORMAL HIGH (ref 1.001–1.035)
WBC, UA: NONE SEEN /HPF (ref 0–5)
pH: 6 (ref 5.0–8.0)

## 2024-01-02 LAB — CBC WITH DIFFERENTIAL/PLATELET
Absolute Lymphocytes: 1664 {cells}/uL (ref 850–3900)
Absolute Monocytes: 874 {cells}/uL (ref 200–950)
Basophils Absolute: 47 {cells}/uL (ref 0–200)
Basophils Relative: 0.5 %
Eosinophils Absolute: 216 {cells}/uL (ref 15–500)
Eosinophils Relative: 2.3 %
HCT: 43.1 % (ref 38.5–50.0)
Hemoglobin: 14.4 g/dL (ref 13.2–17.1)
MCH: 29.1 pg (ref 27.0–33.0)
MCHC: 33.4 g/dL (ref 32.0–36.0)
MCV: 87.2 fL (ref 80.0–100.0)
MPV: 9.1 fL (ref 7.5–12.5)
Monocytes Relative: 9.3 %
Neutro Abs: 6599 {cells}/uL (ref 1500–7800)
Neutrophils Relative %: 70.2 %
Platelets: 214 Thousand/uL (ref 140–400)
RBC: 4.94 Million/uL (ref 4.20–5.80)
RDW: 13.4 % (ref 11.0–15.0)
Total Lymphocyte: 17.7 %
WBC: 9.4 Thousand/uL (ref 3.8–10.8)

## 2024-01-02 LAB — COMPREHENSIVE METABOLIC PANEL WITH GFR
AG Ratio: 1.9 (calc) (ref 1.0–2.5)
ALT: 21 U/L (ref 9–46)
AST: 14 U/L (ref 10–35)
Albumin: 4 g/dL (ref 3.6–5.1)
Alkaline phosphatase (APISO): 57 U/L (ref 35–144)
BUN: 18 mg/dL (ref 7–25)
CO2: 29 mmol/L (ref 20–32)
Calcium: 9.3 mg/dL (ref 8.6–10.3)
Chloride: 105 mmol/L (ref 98–110)
Creat: 0.78 mg/dL (ref 0.70–1.35)
Globulin: 2.1 g/dL (ref 1.9–3.7)
Glucose, Bld: 122 mg/dL — ABNORMAL HIGH (ref 65–99)
Potassium: 3.9 mmol/L (ref 3.5–5.3)
Sodium: 142 mmol/L (ref 135–146)
Total Bilirubin: 0.7 mg/dL (ref 0.2–1.2)
Total Protein: 6.1 g/dL (ref 6.1–8.1)
eGFR: 98 mL/min/1.73m2 (ref >=60)

## 2024-01-02 LAB — LIPID PANEL
Cholesterol: 92 mg/dL (ref <200)
HDL: 34 mg/dL — ABNORMAL LOW (ref >=40)
LDL Cholesterol (Calc): 41 mg/dL
Non-HDL Cholesterol (Calc): 58 mg/dL (ref <130)
Total CHOL/HDL Ratio: 2.7 (calc) (ref <5.0)
Triglycerides: 85 mg/dL (ref <150)

## 2024-01-02 LAB — PSA: PSA: 1.38 ng/mL (ref <=4.00)

## 2024-01-02 LAB — HEMOGLOBIN A1C: Hgb A1c MFr Bld: 6 % — ABNORMAL HIGH (ref <5.7)

## 2024-01-02 LAB — TSH: TSH: 1.5 m[IU]/L (ref 0.40–4.50)

## 2024-01-03 ENCOUNTER — Telehealth: Payer: Self-pay

## 2024-01-03 MED ORDER — CARVEDILOL 25 MG PO TABS: 25.0000 mg | ORAL_TABLET | Freq: Two times a day (BID) | ORAL | 3 refills | Status: AC

## 2024-01-03 NOTE — Telephone Encounter (Signed)
 Call to patient to advise per Dr. Shlomo that blood pressures on bp log are still too high. Sent script for 25 mg coreg  BID to pharmacy of choice, patient agrees to keep another BP log for another week.

## 2024-01-08 ENCOUNTER — Ambulatory Visit: Admitting: Internal Medicine

## 2024-01-15 ENCOUNTER — Ambulatory Visit: Admitting: Internal Medicine

## 2024-01-15 ENCOUNTER — Other Ambulatory Visit: Payer: Self-pay | Admitting: Internal Medicine

## 2024-01-15 ENCOUNTER — Encounter: Payer: Self-pay | Admitting: Internal Medicine

## 2024-01-15 VITALS — BP 160/80 | HR 85 | Temp 97.7°F | Ht 71.0 in | Wt 290.0 lb

## 2024-01-15 DIAGNOSIS — Z6841 Body Mass Index (BMI) 40.0 and over, adult: Secondary | ICD-10-CM

## 2024-01-15 DIAGNOSIS — E1122 Type 2 diabetes mellitus with diabetic chronic kidney disease: Secondary | ICD-10-CM | POA: Diagnosis not present

## 2024-01-15 DIAGNOSIS — I10 Essential (primary) hypertension: Secondary | ICD-10-CM

## 2024-01-15 DIAGNOSIS — N181 Chronic kidney disease, stage 1: Secondary | ICD-10-CM

## 2024-01-15 DIAGNOSIS — E559 Vitamin D deficiency, unspecified: Secondary | ICD-10-CM

## 2024-01-15 DIAGNOSIS — Z794 Long term (current) use of insulin: Secondary | ICD-10-CM | POA: Diagnosis not present

## 2024-01-15 DIAGNOSIS — E66813 Obesity, class 3: Secondary | ICD-10-CM | POA: Diagnosis not present

## 2024-01-15 DIAGNOSIS — I2510 Atherosclerotic heart disease of native coronary artery without angina pectoris: Secondary | ICD-10-CM

## 2024-01-15 MED ORDER — VERAPAMIL HCL ER 120 MG PO TBCR: 120.0000 mg | EXTENDED_RELEASE_TABLET | Freq: Every day | ORAL | 1 refills | Status: DC

## 2024-01-15 MED ORDER — OLMESARTAN MEDOXOMIL-HCTZ 40-25 MG PO TABS: 1.0000 | ORAL_TABLET | Freq: Every day | ORAL | 3 refills | Status: AC

## 2024-01-15 MED ORDER — METFORMIN HCL 1000 MG PO TABS: 500.0000 mg | ORAL_TABLET | Freq: Two times a day (BID) | ORAL | 3 refills | Status: DC

## 2024-01-15 MED ORDER — METFORMIN HCL 500 MG PO TABS: 500.0000 mg | ORAL_TABLET | Freq: Two times a day (BID) | ORAL | 3 refills | Status: AC

## 2024-01-15 MED ORDER — TIRZEPATIDE 5 MG/0.5ML ~~LOC~~ SOAJ: 5.0000 mg | SUBCUTANEOUS | 5 refills | Status: DC

## 2024-01-15 MED ORDER — TAMSULOSIN HCL 0.4 MG PO CAPS: 0.4000 mg | ORAL_CAPSULE | Freq: Every day | ORAL | 3 refills | Status: AC

## 2024-01-15 NOTE — Assessment & Plan Note (Signed)
 On Vit D

## 2024-01-15 NOTE — Assessment & Plan Note (Signed)
 Will try Mounjaro  instead On Farxiga  Reduce Metformin  to 500 mg bid

## 2024-01-15 NOTE — Assessment & Plan Note (Signed)
 Cont w/ ASA, Coreg , Lipitor, Farxiga  NTG prn Treat DM,HTN

## 2024-01-15 NOTE — Assessment & Plan Note (Signed)
 Will try Mounjaro  instead

## 2024-01-15 NOTE — Progress Notes (Signed)
 Subjective:  Patient ID: Andrew Carson, male    DOB: [birth year]  Age: 68 y.o. MRN: [id number]  CC: Follow-up   HPI Andrew Carson presents for HTN, DM, CAD C/o elevated BP - SBP is 160  Outpatient Medications Prior to Visit  Medication Sig Dispense Refill   atorvastatin  (LIPITOR) 80 MG tablet TAKE 1 TABLET BY MOUTH EVERY DAY 90 tablet 3   Bayer Microlet Lancets lancets Use to help check blood sugar twice a day 100 each 12   carvedilol  (COREG ) 25 MG tablet Take 1 tablet (25 mg total) by mouth 2 (two) times daily. 180 tablet 3   celecoxib  (CELEBREX ) 200 MG capsule Take 1 capsule (200 mg total) by mouth daily. 90 capsule 0   Cholecalciferol ([employer]S D3) 25 MCG (1000 UT) capsule Take 1 capsule (1,000 Units total) by mouth daily. 90 capsule 3   clopidogrel  (PLAVIX ) 75 MG tablet TAKE 1 TABLET BY MOUTH EVERY DAY. PLEASE KEEP 07/02/21 APPT FOR FUTURE REFILLS. 90 tablet 3   Continuous Glucose Receiver (DEXCOM G7 RECEIVER) DEVI Use as directed 1 each 1   Continuous Glucose Sensor (DEXCOM G7 SENSOR) MISC Replace q 10 days 3 each 11   diclofenac  Sodium (VOLTAREN ) 1 % GEL Apply 2 g topically 4 (four) times daily. 100 g 3   FARXIGA  10 MG TABS tablet TAKE 1 TABLET BY MOUTH EVERY DAY 90 tablet 1   ferrous sulfate  325 (65 FE) MG tablet TAKE 1 TABLET BY MOUTH EVERY DAY 90 tablet 1   glucose blood (CONTOUR NEXT TEST) test strip Use to check blood sugars twice day 100 each 12   methylPREDNISolone  (MEDROL  DOSEPAK) 4 MG TBPK tablet As directed 21 tablet 0   nitroGLYCERIN  (NITROSTAT ) 0.4 MG SL tablet Place 1 tablet (0.4 mg total) under the tongue every 5 (five) minutes as needed for chest pain. 25 tablet 3   pantoprazole  (PROTONIX ) 40 MG tablet TAKE 1 TABLET BY MOUTH EVERY DAY 90 tablet 3   benazepril -hydrochlorthiazide (LOTENSIN  HCT) 20-12.5 MG tablet TAKE 1 TABLET BY MOUTH DAILY. ANNUAL APPT DUE IN AUGUST MUST SEE PROVIDER FOR FUTURE REFILLS 90 tablet 3   metFORMIN  (GLUCOPHAGE ) 1000 MG tablet Take 1 tablet  (1,000 mg total) by mouth 2 (two) times daily with a meal. 180 tablet 3   Semaglutide , 2 MG/DOSE, 8 MG/3ML SOPN Inject 2 mg as directed once a week. 9 mL 3   tamsulosin (FLOMAX) 0.4 MG CAPS capsule Take 0.4 mg by mouth daily.     No facility-administered medications prior to visit.    ROS: Review of Systems  Constitutional:  Negative for appetite change, fatigue and unexpected weight change.  HENT:  Negative for congestion, nosebleeds, sneezing, sore throat and trouble swallowing.   Eyes:  Negative for itching and visual disturbance.  Respiratory:  Negative for cough.   Cardiovascular:  Negative for chest pain, palpitations and leg swelling.  Gastrointestinal:  Negative for abdominal distention, blood in stool, diarrhea and nausea.  Genitourinary:  Negative for frequency and hematuria.  Musculoskeletal:  Negative for back pain, gait problem, joint swelling and neck pain.  Skin:  Negative for rash.  Neurological:  Negative for dizziness, tremors, speech difficulty and weakness.  Psychiatric/Behavioral:  Negative for agitation, dysphoric mood and sleep disturbance. The patient is not nervous/anxious.     Objective:  BP (!) 160/80 (BP Location: Left Arm, Patient Position: Sitting, Cuff Size: Normal)   Pulse 85   Temp 97.7 F (36.5 C) (Oral)   Ht 5' 11 (  1.803 m)   Wt 290 lb (131.5 kg)   SpO2 98%   BMI 40.45 kg/m   BP Readings from Last 3 Encounters:  01/15/24 (!) 160/80  12/04/23 (!) 152/90  10/02/23 130/80    Wt Readings from Last 3 Encounters:  01/15/24 290 lb (131.5 kg)  12/19/23 290 lb (131.5 kg)  12/04/23 290 lb 6.4 oz (131.7 kg)    Physical Exam Constitutional:      General: He is not in acute distress.    Appearance: He is well-developed.     Comments: NAD  Eyes:     Conjunctiva/sclera: Conjunctivae normal.     Pupils: Pupils are equal, round, and reactive to light.  Neck:     Thyroid: No thyromegaly.     Vascular: No JVD.  Cardiovascular:     Rate and  Rhythm: Normal rate and regular rhythm.     Heart sounds: Normal heart sounds. No murmur heard.    No friction rub. No gallop.  Pulmonary:     Effort: Pulmonary effort is normal. No respiratory distress.     Breath sounds: Normal breath sounds. No wheezing or rales.  Chest:     Chest wall: No tenderness.  Abdominal:     General: Bowel sounds are normal. There is no distension.     Palpations: Abdomen is soft. There is no mass.     Tenderness: There is no abdominal tenderness. There is no guarding or rebound.  Musculoskeletal:        General: No tenderness. Normal range of motion.     Cervical back: Normal range of motion.     Right lower leg: Edema present.     Left lower leg: Edema present.  Lymphadenopathy:     Cervical: No cervical adenopathy.  Skin:    General: Skin is warm and dry.     Findings: No rash.  Neurological:     Mental Status: He is alert and oriented to person, place, and time.     Cranial Nerves: No cranial nerve deficit.     Motor: No abnormal muscle tone.     Coordination: Coordination normal.     Gait: Gait normal.     Deep Tendon Reflexes: Reflexes are normal and symmetric.  Psychiatric:        Behavior: Behavior normal.        Thought Content: Thought content normal.        Judgment: Judgment normal.     Lab Results  Component Value Date   WBC 9.4 01/01/2024   HGB 14.4 01/01/2024   HCT 43.1 01/01/2024   PLT 214 01/01/2024   GLUCOSE 122 (H) 01/01/2024   CHOL 92 01/01/2024   TRIG 85 01/01/2024   HDL 34 (L) 01/01/2024   LDLDIRECT 149.9 03/12/2007   LDLCALC 41 01/01/2024   ALT 21 01/01/2024   AST 14 01/01/2024   NA 142 01/01/2024   K 3.9 01/01/2024   CL 105 01/01/2024   CREATININE 0.78 01/01/2024   BUN 18 01/01/2024   CO2 29 01/01/2024   TSH 1.50 01/01/2024   PSA 1.38 01/01/2024   HGBA1C 6.0 (H) 01/01/2024   MICROALBUR 0.4 03/20/2023    MYOCARDIAL PERFUSION/CT RAD READ Result Date: 12/26/2023 CLINICAL DATA:  This over-read does not  include interpretation of cardiac or coronary anatomy or pathology. The myocardial perfusion interpretation by the cardiologist is attached. COMPARISON:  None Available. FINDINGS: Vascular: The heart is normal in size. The thoracic aorta is normal in caliber. No pericardial effusion.  Mediastinum/Nodes: 4.1 cm hypodense right thyroid nodule. No mediastinal adenopathy. Decompressed esophagus. Lungs/Pleura: Trace bilateral pleural effusions. No focal airspace disease or pulmonary nodule. Upper Abdomen: No acute findings. Musculoskeletal: There are no acute or suspicious osseous abnormalities. IMPRESSION: 1. Trace bilateral pleural effusions. 2. Incidental right thyroid nodule measuring 4.1 cm. Recommend thyroid US  (ref: J Am Coll Radiol. 2015 Feb;12(2): 143-50). Electronically Signed   By: Andrea Gasman M.D.   On: 12/26/2023 20:18   MYOCARDIAL PERFUSION IMAGING Result Date: 12/20/2023   Findings are consistent with no ischemia and no infarction. The study is low risk.   No ST deviation was noted.   LV perfusion is normal. There is no evidence of ischemia. There is no evidence of infarction.   Left ventricular function is normal. Nuclear stress EF: 63%. The left ventricular ejection fraction is normal (55-65%). End diastolic cavity size is normal. End systolic cavity size is normal. No evidence of transient ischemic dilation (TID) noted.   CT images were obtained for attenuation correction and were examined for the presence of coronary calcium  when appropriate.   Coronary calcium  assessment not performed due to prior revascularization.   Prior study available for comparison from 07/27/2021. No changes compared to prior study.    Assessment & Plan:   Problem List Items Addressed This Visit     Coronary artery disease   Cont w/ ASA, Coreg , Lipitor, Farxiga  NTG prn Treat DM,HTN      Relevant Medications   olmesartan-hydrochlorothiazide  (BENICAR HCT) 40-25 MG tablet   verapamil (CALAN-SR) 120 MG CR  tablet   DM2 (diabetes mellitus, type 2) (HCC) - Primary   Will try Mounjaro  instead On Farxiga  Reduce Metformin  to 500 mg bid      Relevant Medications   tirzepatide  (MOUNJARO ) 5 MG/0.5ML Pen   olmesartan-hydrochlorothiazide  (BENICAR HCT) 40-25 MG tablet   metFORMIN  (GLUCOPHAGE ) 500 MG tablet   Other Relevant Orders   Hemoglobin A1c   Comprehensive metabolic panel with GFR   TSH   Essential hypertension    SBP is 160 On Coreg  Start Olmes/CTZ in place of Lotensin /HCT Verapamil w/caution      Relevant Medications   olmesartan-hydrochlorothiazide  (BENICAR HCT) 40-25 MG tablet   verapamil (CALAN-SR) 120 MG CR tablet   Other Relevant Orders   Comprehensive metabolic panel with GFR   Obesity   Will try Mounjaro  instead      Relevant Medications   tirzepatide  (MOUNJARO ) 5 MG/0.5ML Pen   metFORMIN  (GLUCOPHAGE ) 500 MG tablet   Vitamin D deficiency   On Vit D         Meds ordered this encounter  Medications   tirzepatide  (MOUNJARO ) 5 MG/0.5ML Pen    Sig: Inject 5 mg into the skin once a week.    Dispense:  2 mL    Refill:  5   olmesartan-hydrochlorothiazide  (BENICAR HCT) 40-25 MG tablet    Sig: Take 1 tablet by mouth daily.    Dispense:  90 tablet    Refill:  3   tamsulosin (FLOMAX) 0.4 MG CAPS capsule    Sig: Take 1 capsule (0.4 mg total) by mouth daily.    Dispense:  90 capsule    Refill:  3   verapamil (CALAN-SR) 120 MG CR tablet    Sig: Take 1 tablet (120 mg total) by mouth at bedtime.    Dispense:  30 tablet    Refill:  1   DISCONTD: metFORMIN  (GLUCOPHAGE ) 1000 MG tablet    Sig: Take 0.5 tablets (500 mg total)  by mouth 2 (two) times daily with a meal.    Dispense:  90 tablet    Refill:  3   metFORMIN  (GLUCOPHAGE ) 500 MG tablet    Sig: Take 1 tablet (500 mg total) by mouth 2 (two) times daily with a meal.    Dispense:  180 tablet    Refill:  3      Follow-up: Return in about 3 months (around 04/16/2024) for a follow-up visit.  Marolyn Noel, MD

## 2024-01-15 NOTE — Assessment & Plan Note (Signed)
 SBP is 160 On Coreg  Start Olmes/CTZ in place of Lotensin /HCT Verapamil w/caution

## 2024-01-16 ENCOUNTER — Telehealth: Payer: Self-pay | Admitting: Cardiology

## 2024-01-16 NOTE — Telephone Encounter (Signed)
 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 Advised I received his BP log on 01/15/24 but Dr. Shlomo is out of town and has not looked at the readings yet, which remain elevated. Patient states he had DOT physical Monday and they will check his BP then. He states his PCP is also adjusting his meds, and it appears from med list Benicar was added to med list yesterday. Patient states he may be able to get a 3 month extension on Monday to try and get his blood pressure down further. Asked patient to advise us  if this is the case on Monday, also advised Dr. Shlomo is back in the office on Tuesday 01/23/24.

## 2024-01-16 NOTE — Telephone Encounter (Signed)
 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  Patient wants to pick up the paperwork on Monday (10/13) when he is back in town.

## 2024-01-16 NOTE — Telephone Encounter (Signed)
 Called patient to inform him - Dr Shlomo is not in the office this week . He will not be able to pick up form.   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  Patient states she had stress test in Aug. I don't know why she did sign the form   Dr Dorine nurse will contact patient

## 2024-01-18 ENCOUNTER — Other Ambulatory Visit: Payer: Self-pay | Admitting: Internal Medicine

## 2024-01-20 ENCOUNTER — Other Ambulatory Visit: Payer: Self-pay | Admitting: Internal Medicine

## 2024-01-20 MED ORDER — VERAPAMIL HCL ER 120 MG PO TBCR: 120.0000 mg | EXTENDED_RELEASE_TABLET | Freq: Every day | ORAL | 1 refills | Status: AC

## 2024-01-22 NOTE — Telephone Encounter (Signed)
 Patient presenting at office today, reports he sent his BP log in over Mychart. Attached BP log and echo results to form patient needs signed and placed in Dr. Dorine box.

## 2024-01-23 NOTE — Telephone Encounter (Signed)
 Form was faxed today and patient notified. Copy in Dr. Dorine mailbox.

## 2024-02-22 ENCOUNTER — Other Ambulatory Visit: Payer: Self-pay | Admitting: Internal Medicine

## 2024-02-27 ENCOUNTER — Other Ambulatory Visit: Payer: Self-pay | Admitting: Internal Medicine

## 2024-03-01 ENCOUNTER — Other Ambulatory Visit: Payer: Self-pay | Admitting: Internal Medicine

## 2024-04-22 ENCOUNTER — Encounter: Payer: Self-pay | Admitting: Internal Medicine

## 2024-04-22 ENCOUNTER — Ambulatory Visit: Admitting: Internal Medicine

## 2024-04-22 VITALS — BP 128/68 | HR 74 | Ht 71.0 in | Wt 290.2 lb

## 2024-04-22 DIAGNOSIS — Z6841 Body Mass Index (BMI) 40.0 and over, adult: Secondary | ICD-10-CM

## 2024-04-22 DIAGNOSIS — Z794 Long term (current) use of insulin: Secondary | ICD-10-CM

## 2024-04-22 DIAGNOSIS — E1122 Type 2 diabetes mellitus with diabetic chronic kidney disease: Secondary | ICD-10-CM

## 2024-04-22 DIAGNOSIS — I10 Essential (primary) hypertension: Secondary | ICD-10-CM | POA: Diagnosis not present

## 2024-04-22 DIAGNOSIS — M65342 Trigger finger, left ring finger: Secondary | ICD-10-CM | POA: Diagnosis not present

## 2024-04-22 DIAGNOSIS — M6530 Trigger finger, unspecified finger: Secondary | ICD-10-CM

## 2024-04-22 DIAGNOSIS — E66813 Obesity, class 3: Secondary | ICD-10-CM

## 2024-04-22 DIAGNOSIS — N181 Chronic kidney disease, stage 1: Secondary | ICD-10-CM

## 2024-04-22 MED ORDER — TIRZEPATIDE 7.5 MG/0.5ML ~~LOC~~ SOAJ: 7.5000 mg | SUBCUTANEOUS | 5 refills | Status: AC

## 2024-04-22 MED ORDER — METHYLPREDNISOLONE ACETATE 40 MG/ML IJ SUSP
10.0000 mg | Freq: Once | INTRAMUSCULAR | Status: AC
  Administered 2024-04-22: 10 mg via INTRAMUSCULAR

## 2024-04-22 MED ORDER — CEFDINIR 300 MG PO CAPS: 300.0000 mg | ORAL_CAPSULE | Freq: Two times a day (BID) | ORAL | 0 refills | Status: AC

## 2024-04-22 MED ORDER — TIRZEPATIDE 7.5 MG/0.5ML ~~LOC~~ SOAJ: 7.5000 mg | SUBCUTANEOUS | 5 refills | Status: DC

## 2024-04-22 NOTE — Addendum Note (Signed)
 Addended byBETHA LUCETTA CLEATRICE LELON on: 04/22/2024 11:12 AM   Modules accepted: Orders

## 2024-04-22 NOTE — Assessment & Plan Note (Signed)
"   L 3d finger Pt asked to inject "

## 2024-04-22 NOTE — Assessment & Plan Note (Signed)
 Wt Readings from Last 3 Encounters:  04/22/24 290 lb 3.2 oz (131.6 kg)  01/15/24 290 lb (131.5 kg)  12/19/23 290 lb (131.5 kg)

## 2024-04-22 NOTE — Patient Instructions (Signed)
 Vive Trigger Finger Splint - Support Brace for Middle, Ring, Index, Thumb, Pinky, and Hands - Straightening Curved, Bent, Locked Stenosing Tenosynovitis Tendon Lock Release Knuckle Stabilizer - HSA/FSA Visit the [employer] 4.2 4.2 out of 5 stars   [phone]) [employer] 4K+ bought in past month $8.99$8.99

## 2024-04-22 NOTE — Assessment & Plan Note (Addendum)
 Increase Mounjaro  to 7.5 mg/d On Farxiga  Reduce Metformin  to 500 mg bid A1c 6.6%

## 2024-04-22 NOTE — Assessment & Plan Note (Signed)
 BP is nl On Coreg ,  Olmes/CTZ in place of Lotensin /HCT Verapamil  w/caution

## 2024-04-22 NOTE — Progress Notes (Signed)
 "  Subjective:  Patient ID: Andrew Carson, male    DOB: Dec 08, 1955  Age: 69 y.o. MRN: [id number]  CC: Medical Management of Chronic Issues (3 Month follow up. Left hand pain/stiffness, especially in left middle digit)   HPI Gladis MALVA Bowersox presents for DM, HTN f/u C/o cold x 7 d - congested  Outpatient Medications Prior to Visit  Medication Sig Dispense Refill   atorvastatin  (LIPITOR) 80 MG tablet TAKE 1 TABLET BY MOUTH EVERY DAY 90 tablet 3   Bayer Microlet Lancets lancets Use to help check blood sugar twice a day 100 each 12   carvedilol  (COREG ) 25 MG tablet Take 1 tablet (25 mg total) by mouth 2 (two) times daily. 180 tablet 3   celecoxib  (CELEBREX ) 200 MG capsule TAKE 1 CAPSULE BY MOUTH EVERY DAY 90 capsule 0   Cholecalciferol ([employer]S D3) 25 MCG (1000 UT) capsule Take 1 capsule (1,000 Units total) by mouth daily. 90 capsule 3   clopidogrel  (PLAVIX ) 75 MG tablet TAKE 1 TABLET BY MOUTH EVERY DAY. PLEASE KEEP 07/02/21 APPT FOR FUTURE REFILLS. 90 tablet 3   Continuous Glucose Receiver (DEXCOM G7 RECEIVER) DEVI Use as directed 1 each 1   Continuous Glucose Sensor (DEXCOM G7 SENSOR) MISC Replace q 10 days 3 each 11   dapagliflozin  propanediol (FARXIGA ) 10 MG TABS tablet TAKE 1 TABLET BY MOUTH EVERY DAY 90 tablet 3   diclofenac  Sodium (VOLTAREN ) 1 % GEL Apply 2 g topically 4 (four) times daily. 100 g 3   ferrous sulfate  325 (65 FE) MG tablet TAKE 1 TABLET BY MOUTH EVERY DAY 90 tablet 1   glucose blood (CONTOUR NEXT TEST) test strip Use to check blood sugars twice day 100 each 12   metFORMIN  (GLUCOPHAGE ) 500 MG tablet Take 1 tablet (500 mg total) by mouth 2 (two) times daily with a meal. 180 tablet 3   nitroGLYCERIN  (NITROSTAT ) 0.4 MG SL tablet Place 1 tablet (0.4 mg total) under the tongue every 5 (five) minutes as needed for chest pain. 25 tablet 3   olmesartan -hydrochlorothiazide  (BENICAR  HCT) 40-25 MG tablet Take 1 tablet by mouth daily. 90 tablet 3   pantoprazole  (PROTONIX ) 40 MG tablet TAKE  1 TABLET BY MOUTH EVERY DAY 90 tablet 3   tamsulosin  (FLOMAX ) 0.4 MG CAPS capsule Take 1 capsule (0.4 mg total) by mouth daily. 90 capsule 3   verapamil  (CALAN -SR) 120 MG CR tablet Take 1 tablet (120 mg total) by mouth at bedtime. 30 tablet 1   tirzepatide  (MOUNJARO ) 5 MG/0.5ML Pen INJECT 5 MG SUBCUTANEOUSLY WEEKLY 2 mL 5   methylPREDNISolone  (MEDROL  DOSEPAK) 4 MG TBPK tablet As directed 21 tablet 0   No facility-administered medications prior to visit.    ROS: Review of Systems  Constitutional:  Positive for fatigue. Negative for appetite change and unexpected weight change.  HENT:  Positive for congestion, postnasal drip, rhinorrhea and sinus pressure. Negative for nosebleeds, sneezing, sore throat and trouble swallowing.   Eyes:  Negative for itching and visual disturbance.  Respiratory:  Negative for cough.   Cardiovascular:  Negative for chest pain, palpitations and leg swelling.  Gastrointestinal:  Negative for abdominal distention, blood in stool, diarrhea and nausea.  Genitourinary:  Negative for frequency and hematuria.  Musculoskeletal:  Negative for back pain, gait problem, joint swelling and neck pain.  Skin:  Negative for rash.  Neurological:  Negative for dizziness, tremors, speech difficulty and weakness.  Psychiatric/Behavioral:  Negative for agitation, dysphoric mood and sleep disturbance. The patient is not  nervous/anxious.     Objective:  BP 128/68   Pulse 74   Ht 5' 11 (1.803 m)   Wt 290 lb 3.2 oz (131.6 kg)   SpO2 97%   BMI 40.47 kg/m   BP Readings from Last 3 Encounters:  04/22/24 128/68  01/15/24 (!) 160/80  12/04/23 (!) 152/90    Wt Readings from Last 3 Encounters:  04/22/24 290 lb 3.2 oz (131.6 kg)  01/15/24 290 lb (131.5 kg)  12/19/23 290 lb (131.5 kg)    Physical Exam Constitutional:      General: He is not in acute distress.    Appearance: He is well-developed. He is obese.     Comments: NAD  HENT:     Nose: Congestion and rhinorrhea  present.     Mouth/Throat:     Pharynx: Posterior oropharyngeal erythema present. No oropharyngeal exudate.  Eyes:     Conjunctiva/sclera: Conjunctivae normal.     Pupils: Pupils are equal, round, and reactive to light.  Neck:     Thyroid: No thyromegaly.     Vascular: No JVD.  Cardiovascular:     Rate and Rhythm: Normal rate and regular rhythm.     Heart sounds: Normal heart sounds. No murmur heard.    No friction rub. No gallop.  Pulmonary:     Effort: Pulmonary effort is normal. No respiratory distress.     Breath sounds: Normal breath sounds. No wheezing or rales.  Chest:     Chest wall: No tenderness.  Abdominal:     General: Bowel sounds are normal. There is no distension.     Palpations: Abdomen is soft. There is no mass.     Tenderness: There is no abdominal tenderness. There is no guarding or rebound.  Musculoskeletal:        General: No tenderness. Normal range of motion.     Cervical back: Normal range of motion.  Lymphadenopathy:     Cervical: No cervical adenopathy.  Skin:    General: Skin is warm and dry.     Findings: No rash.  Neurological:     Mental Status: He is alert and oriented to person, place, and time.     Cranial Nerves: No cranial nerve deficit.     Motor: No abnormal muscle tone.     Coordination: Coordination normal.     Gait: Gait normal.     Deep Tendon Reflexes: Reflexes are normal and symmetric.  Psychiatric:        Behavior: Behavior normal.        Thought Content: Thought content normal.        Judgment: Judgment normal.    L 3d finger is triggering   Procedure Note :    Trigger finger tendon Injection:   Indication : Trigger finger. L 3d finger is triggering   Risks including unsuccessful procedure , bleeding, infection, bruising, skin atrophy and others were explained to the patient in detail as well as the benefits. Informed consent was obtained and signed.   Tthe patient was placed in a comfortable position.   L 3d finger   flexor digitorum tendon was marked and  the skin was prepped with Betadine and alcohol. 1 inch 25-gauge needle was used. The needle was advanced  into the skin down to tendon. It  was injected with 0.5 mL of 2% lidocaine  and 10 mg of Depo-Medrol  in a usual fashion.  Band-Aid applied. ACE wrap   Tolerated well. Complications: None. Good pain relief following the procedure.  Lab Results  Component Value Date   WBC 9.4 01/01/2024   HGB 14.4 01/01/2024   HCT 43.1 01/01/2024   PLT 214 01/01/2024   GLUCOSE 122 (H) 01/01/2024   CHOL 92 01/01/2024   TRIG 85 01/01/2024   HDL 34 (L) 01/01/2024   LDLDIRECT 149.9 03/12/2007   LDLCALC 41 01/01/2024   ALT 21 01/01/2024   AST 14 01/01/2024   NA 142 01/01/2024   K 3.9 01/01/2024   CL 105 01/01/2024   CREATININE 0.78 01/01/2024   BUN 18 01/01/2024   CO2 29 01/01/2024   TSH 1.50 01/01/2024   PSA 1.38 01/01/2024   HGBA1C 6.0 (H) 01/01/2024   MICROALBUR 0.4 03/20/2023    MYOCARDIAL PERFUSION/CT RAD READ Result Date: 12/26/2023 CLINICAL DATA:  This over-read does not include interpretation of cardiac or coronary anatomy or pathology. The myocardial perfusion interpretation by the cardiologist is attached. COMPARISON:  None Available. FINDINGS: Vascular: The heart is normal in size. The thoracic aorta is normal in caliber. No pericardial effusion. Mediastinum/Nodes: 4.1 cm hypodense right thyroid nodule. No mediastinal adenopathy. Decompressed esophagus. Lungs/Pleura: Trace bilateral pleural effusions. No focal airspace disease or pulmonary nodule. Upper Abdomen: No acute findings. Musculoskeletal: There are no acute or suspicious osseous abnormalities. IMPRESSION: 1. Trace bilateral pleural effusions. 2. Incidental right thyroid nodule measuring 4.1 cm. Recommend thyroid US  (ref: J Am Coll Radiol. 2015 Feb;12(2): 143-50). Electronically Signed   By: Andrea Gasman M.D.   On: 12/26/2023 20:18   MYOCARDIAL PERFUSION IMAGING Result Date: 12/20/2023    Findings are consistent with no ischemia and no infarction. The study is low risk.   No ST deviation was noted.   LV perfusion is normal. There is no evidence of ischemia. There is no evidence of infarction.   Left ventricular function is normal. Nuclear stress EF: 63%. The left ventricular ejection fraction is normal (55-65%). End diastolic cavity size is normal. End systolic cavity size is normal. No evidence of transient ischemic dilation (TID) noted.   CT images were obtained for attenuation correction and were examined for the presence of coronary calcium  when appropriate.   Coronary calcium  assessment not performed due to prior revascularization.   Prior study available for comparison from 07/27/2021. No changes compared to prior study.    Assessment & Plan:   Problem List Items Addressed This Visit     DM2 (diabetes mellitus, type 2) (HCC) - Primary   Increase Mounjaro  to 7.5 mg/d On Farxiga  Reduce Metformin  to 500 mg bid A1c 6.6%      Relevant Medications   tirzepatide  (MOUNJARO ) 7.5 MG/0.5ML Pen   Obesity   Wt Readings from Last 3 Encounters:  04/22/24 290 lb 3.2 oz (131.6 kg)  01/15/24 290 lb (131.5 kg)  12/19/23 290 lb (131.5 kg)         Relevant Medications   tirzepatide  (MOUNJARO ) 7.5 MG/0.5ML Pen   Essential hypertension   BP is nl On Coreg ,  Olmes/CTZ in place of Lotensin /HCT Verapamil  w/caution      Trigger finger    L 3d finger Pt asked to inject         Meds ordered this encounter  Medications   cefdinir  (OMNICEF ) 300 MG capsule    Sig: Take 1 capsule (300 mg total) by mouth 2 (two) times daily.    Dispense:  20 capsule    Refill:  0   DISCONTD: tirzepatide  (MOUNJARO ) 7.5 MG/0.5ML Pen    Sig: Inject 7.5 mg into the skin once a week.  Dispense:  2 mL    Refill:  5   tirzepatide  (MOUNJARO ) 7.5 MG/0.5ML Pen    Sig: Inject 7.5 mg into the skin once a week.    Dispense:  6 mL    Refill:  5      Follow-up: No follow-ups on file.  Marolyn Noel, MD "

## 2024-04-25 ENCOUNTER — Other Ambulatory Visit: Payer: Self-pay | Admitting: Internal Medicine

## 2024-07-22 ENCOUNTER — Ambulatory Visit: Admitting: Internal Medicine
